# Patient Record
Sex: Male | Born: 1945 | Race: White | Hispanic: No | Smoking: Current every day smoker
Health system: Southern US, Community
[De-identification: ages and names within clinical notes are randomized; demographics above are authoritative.]

## PROBLEM LIST (undated history)

## (undated) DIAGNOSIS — D751 Secondary polycythemia: Principal | ICD-10-CM

## (undated) DIAGNOSIS — J449 Chronic obstructive pulmonary disease, unspecified: Secondary | ICD-10-CM

## (undated) DIAGNOSIS — I10 Essential (primary) hypertension: Secondary | ICD-10-CM

## (undated) DIAGNOSIS — J984 Other disorders of lung: Principal | ICD-10-CM

## (undated) DIAGNOSIS — I639 Cerebral infarction, unspecified: Secondary | ICD-10-CM

## (undated) DIAGNOSIS — A64 Unspecified sexually transmitted disease: Secondary | ICD-10-CM

## (undated) HISTORY — DX: Chronic obstructive pulmonary disease, unspecified: J44.9

## (undated) HISTORY — DX: Unspecified sexually transmitted disease: A64

## (undated) HISTORY — DX: Secondary polycythemia: D75.1

## (undated) HISTORY — PX: INCISE AND DRAIN ABCESS: PRO64

## (undated) HISTORY — PX: LYMPH NODE BIOPSY: SHX201

## (undated) HISTORY — DX: Other disorders of lung: J98.4

---

## 1998-03-12 ENCOUNTER — Encounter: Payer: Self-pay | Admitting: Neurosurgery

## 1998-03-13 ENCOUNTER — Observation Stay (HOSPITAL_COMMUNITY): Admission: RE | Admit: 1998-03-13 | Discharge: 1998-03-13 | Payer: Self-pay | Admitting: Neurosurgery

## 1998-03-13 ENCOUNTER — Encounter: Payer: Self-pay | Admitting: Neurosurgery

## 1998-05-07 ENCOUNTER — Ambulatory Visit (HOSPITAL_COMMUNITY): Admission: RE | Admit: 1998-05-07 | Discharge: 1998-05-07 | Payer: Self-pay | Admitting: Neurosurgery

## 1998-05-07 ENCOUNTER — Encounter: Payer: Self-pay | Admitting: Neurosurgery

## 1999-01-08 ENCOUNTER — Encounter: Payer: Self-pay | Admitting: Urology

## 1999-01-08 ENCOUNTER — Ambulatory Visit (HOSPITAL_COMMUNITY): Admission: RE | Admit: 1999-01-08 | Discharge: 1999-01-08 | Payer: Self-pay | Admitting: Urology

## 1999-02-12 ENCOUNTER — Encounter: Admission: RE | Admit: 1999-02-12 | Discharge: 1999-02-12 | Payer: Self-pay | Admitting: Urology

## 1999-02-12 ENCOUNTER — Encounter: Payer: Self-pay | Admitting: Urology

## 1999-04-01 ENCOUNTER — Encounter: Payer: Self-pay | Admitting: Urology

## 1999-04-01 ENCOUNTER — Encounter: Admission: RE | Admit: 1999-04-01 | Discharge: 1999-04-01 | Payer: Self-pay | Admitting: Urology

## 1999-04-09 ENCOUNTER — Ambulatory Visit (HOSPITAL_COMMUNITY): Admission: RE | Admit: 1999-04-09 | Discharge: 1999-04-09 | Payer: Self-pay | Admitting: Urology

## 1999-04-09 ENCOUNTER — Encounter: Payer: Self-pay | Admitting: Urology

## 1999-04-16 ENCOUNTER — Encounter: Admission: RE | Admit: 1999-04-16 | Discharge: 1999-04-16 | Payer: Self-pay | Admitting: Urology

## 1999-04-16 ENCOUNTER — Encounter: Payer: Self-pay | Admitting: Urology

## 1999-06-10 ENCOUNTER — Encounter: Payer: Self-pay | Admitting: Urology

## 1999-06-10 ENCOUNTER — Encounter: Admission: RE | Admit: 1999-06-10 | Discharge: 1999-06-10 | Payer: Self-pay | Admitting: Urology

## 2006-10-21 ENCOUNTER — Emergency Department (HOSPITAL_COMMUNITY): Admission: EM | Admit: 2006-10-21 | Discharge: 2006-10-21 | Payer: Self-pay | Admitting: Emergency Medicine

## 2009-03-17 ENCOUNTER — Encounter (HOSPITAL_COMMUNITY): Admission: RE | Admit: 2009-03-17 | Discharge: 2009-04-16 | Payer: Self-pay

## 2010-05-22 ENCOUNTER — Encounter (HOSPITAL_COMMUNITY): Payer: Non-veteran care | Attending: Oncology

## 2010-05-22 ENCOUNTER — Other Ambulatory Visit (HOSPITAL_COMMUNITY): Payer: Self-pay | Admitting: Oncology

## 2010-05-22 ENCOUNTER — Ambulatory Visit (HOSPITAL_COMMUNITY): Payer: Non-veteran care

## 2010-05-22 ENCOUNTER — Ambulatory Visit (HOSPITAL_COMMUNITY): Payer: Non-veteran care | Admitting: Oncology

## 2010-05-22 DIAGNOSIS — F17200 Nicotine dependence, unspecified, uncomplicated: Secondary | ICD-10-CM | POA: Insufficient documentation

## 2010-05-22 DIAGNOSIS — D751 Secondary polycythemia: Secondary | ICD-10-CM

## 2010-05-22 DIAGNOSIS — J449 Chronic obstructive pulmonary disease, unspecified: Secondary | ICD-10-CM | POA: Insufficient documentation

## 2010-05-22 DIAGNOSIS — J4489 Other specified chronic obstructive pulmonary disease: Secondary | ICD-10-CM | POA: Insufficient documentation

## 2010-05-22 LAB — URINE MICROSCOPIC-ADD ON

## 2010-05-27 ENCOUNTER — Encounter (HOSPITAL_COMMUNITY): Payer: Non-veteran care

## 2010-06-03 ENCOUNTER — Encounter (HOSPITAL_COMMUNITY): Payer: Non-veteran care

## 2010-06-03 DIAGNOSIS — D751 Secondary polycythemia: Secondary | ICD-10-CM

## 2010-06-10 ENCOUNTER — Encounter (HOSPITAL_COMMUNITY): Payer: Non-veteran care

## 2010-06-10 ENCOUNTER — Encounter (HOSPITAL_COMMUNITY): Payer: Non-veteran care | Attending: Oncology

## 2010-06-10 DIAGNOSIS — D751 Secondary polycythemia: Secondary | ICD-10-CM

## 2010-06-17 ENCOUNTER — Encounter (HOSPITAL_COMMUNITY): Payer: Non-veteran care | Attending: Oncology

## 2010-06-17 ENCOUNTER — Encounter (HOSPITAL_COMMUNITY): Payer: Non-veteran care

## 2010-06-17 DIAGNOSIS — J4489 Other specified chronic obstructive pulmonary disease: Secondary | ICD-10-CM | POA: Insufficient documentation

## 2010-06-17 DIAGNOSIS — J449 Chronic obstructive pulmonary disease, unspecified: Secondary | ICD-10-CM | POA: Insufficient documentation

## 2010-06-17 DIAGNOSIS — F17200 Nicotine dependence, unspecified, uncomplicated: Secondary | ICD-10-CM | POA: Insufficient documentation

## 2010-06-17 DIAGNOSIS — D751 Secondary polycythemia: Secondary | ICD-10-CM

## 2010-06-24 ENCOUNTER — Other Ambulatory Visit (HOSPITAL_COMMUNITY): Payer: Non-veteran care

## 2010-06-24 ENCOUNTER — Encounter (HOSPITAL_COMMUNITY): Payer: Non-veteran care | Attending: Oncology

## 2010-06-24 DIAGNOSIS — D751 Secondary polycythemia: Secondary | ICD-10-CM

## 2010-06-26 ENCOUNTER — Ambulatory Visit (HOSPITAL_COMMUNITY): Payer: Non-veteran care | Admitting: Oncology

## 2010-07-01 ENCOUNTER — Encounter (HOSPITAL_COMMUNITY): Payer: Non-veteran care

## 2010-07-01 DIAGNOSIS — D751 Secondary polycythemia: Secondary | ICD-10-CM

## 2010-07-03 ENCOUNTER — Encounter (HOSPITAL_COMMUNITY): Payer: Non-veteran care | Admitting: Oncology

## 2010-07-03 DIAGNOSIS — D751 Secondary polycythemia: Secondary | ICD-10-CM

## 2010-07-08 ENCOUNTER — Other Ambulatory Visit (HOSPITAL_COMMUNITY): Payer: Self-pay | Admitting: Oncology

## 2010-07-08 DIAGNOSIS — D751 Secondary polycythemia: Secondary | ICD-10-CM

## 2010-07-13 ENCOUNTER — Ambulatory Visit (HOSPITAL_COMMUNITY)
Admission: RE | Admit: 2010-07-13 | Discharge: 2010-07-13 | Disposition: A | Discharge status: Home / Self Care | Payer: Non-veteran care | Source: Ambulatory Visit | Attending: Oncology | Admitting: Oncology

## 2010-07-13 DIAGNOSIS — D751 Secondary polycythemia: Secondary | ICD-10-CM

## 2010-07-13 DIAGNOSIS — Q619 Cystic kidney disease, unspecified: Secondary | ICD-10-CM | POA: Insufficient documentation

## 2010-07-13 DIAGNOSIS — D45 Polycythemia vera: Secondary | ICD-10-CM | POA: Insufficient documentation

## 2010-07-13 DIAGNOSIS — Z87442 Personal history of urinary calculi: Secondary | ICD-10-CM | POA: Insufficient documentation

## 2010-08-05 ENCOUNTER — Other Ambulatory Visit (HOSPITAL_COMMUNITY): Payer: Self-pay | Admitting: Oncology

## 2010-08-05 ENCOUNTER — Encounter (HOSPITAL_COMMUNITY): Payer: Non-veteran care | Attending: Oncology

## 2010-08-05 DIAGNOSIS — D45 Polycythemia vera: Secondary | ICD-10-CM

## 2010-08-05 DIAGNOSIS — D751 Secondary polycythemia: Secondary | ICD-10-CM | POA: Insufficient documentation

## 2010-08-05 DIAGNOSIS — J4489 Other specified chronic obstructive pulmonary disease: Secondary | ICD-10-CM | POA: Insufficient documentation

## 2010-08-05 DIAGNOSIS — F17200 Nicotine dependence, unspecified, uncomplicated: Secondary | ICD-10-CM | POA: Insufficient documentation

## 2010-08-05 DIAGNOSIS — J449 Chronic obstructive pulmonary disease, unspecified: Secondary | ICD-10-CM | POA: Insufficient documentation

## 2010-08-05 LAB — DIFFERENTIAL
Basophils Relative: 2 % — ABNORMAL HIGH (ref 0–1)
Eosinophils Absolute: 0.2 10*3/uL (ref 0.0–0.7)
Eosinophils Absolute: 0.2 10*3/uL (ref 0.0–0.7)
Lymphocytes Relative: 30 % (ref 12–46)
Lymphs Abs: 1.6 10*3/uL (ref 0.7–4.0)
Neutro Abs: 3.3 10*3/uL (ref 1.7–7.7)
Neutrophils Relative %: 60 % (ref 43–77)
Neutrophils Relative %: 60 % (ref 43–77)

## 2010-08-05 LAB — CBC
Platelets: 152 10*3/uL (ref 150–400)
RBC: 5.07 MIL/uL (ref 4.22–5.81)
WBC: 5.5 10*3/uL (ref 4.0–10.5)

## 2010-08-07 ENCOUNTER — Encounter (HOSPITAL_COMMUNITY): Payer: Non-veteran care | Admitting: Oncology

## 2010-08-07 DIAGNOSIS — D45 Polycythemia vera: Secondary | ICD-10-CM

## 2010-08-12 ENCOUNTER — Encounter (HOSPITAL_COMMUNITY): Payer: Non-veteran care

## 2010-08-12 DIAGNOSIS — D45 Polycythemia vera: Secondary | ICD-10-CM

## 2010-08-19 ENCOUNTER — Encounter (HOSPITAL_COMMUNITY): Payer: Non-veteran care | Attending: Oncology

## 2010-08-19 DIAGNOSIS — F17200 Nicotine dependence, unspecified, uncomplicated: Secondary | ICD-10-CM | POA: Insufficient documentation

## 2010-08-19 DIAGNOSIS — D751 Secondary polycythemia: Secondary | ICD-10-CM

## 2010-08-19 DIAGNOSIS — J4489 Other specified chronic obstructive pulmonary disease: Secondary | ICD-10-CM | POA: Insufficient documentation

## 2010-08-19 DIAGNOSIS — J449 Chronic obstructive pulmonary disease, unspecified: Secondary | ICD-10-CM | POA: Insufficient documentation

## 2010-09-02 ENCOUNTER — Other Ambulatory Visit (HOSPITAL_COMMUNITY): Payer: Self-pay | Admitting: Oncology

## 2010-09-02 ENCOUNTER — Encounter (HOSPITAL_COMMUNITY): Payer: Non-veteran care

## 2010-09-02 DIAGNOSIS — D45 Polycythemia vera: Secondary | ICD-10-CM

## 2010-09-02 LAB — CBC
HCT: 48.7 % (ref 39.0–52.0)
MCV: 98.4 fL (ref 78.0–100.0)
Platelets: 155 10*3/uL (ref 150–400)
RBC: 4.95 MIL/uL (ref 4.22–5.81)
WBC: 7.3 10*3/uL (ref 4.0–10.5)

## 2010-09-04 ENCOUNTER — Encounter (HOSPITAL_COMMUNITY): Payer: Non-veteran care | Admitting: Oncology

## 2010-09-04 DIAGNOSIS — D45 Polycythemia vera: Secondary | ICD-10-CM

## 2010-09-09 ENCOUNTER — Encounter (HOSPITAL_COMMUNITY): Payer: Non-veteran care

## 2010-09-09 DIAGNOSIS — D45 Polycythemia vera: Secondary | ICD-10-CM

## 2010-09-22 ENCOUNTER — Encounter (HOSPITAL_COMMUNITY): Payer: Self-pay | Admitting: Oncology

## 2010-09-22 ENCOUNTER — Other Ambulatory Visit (HOSPITAL_COMMUNITY): Payer: Self-pay | Admitting: Oncology

## 2010-09-22 DIAGNOSIS — D751 Secondary polycythemia: Secondary | ICD-10-CM

## 2010-09-22 DIAGNOSIS — J449 Chronic obstructive pulmonary disease, unspecified: Secondary | ICD-10-CM

## 2010-09-22 DIAGNOSIS — J984 Other disorders of lung: Secondary | ICD-10-CM

## 2010-09-22 HISTORY — DX: Other disorders of lung: J98.4

## 2010-09-22 HISTORY — DX: Chronic obstructive pulmonary disease, unspecified: J44.9

## 2010-09-22 HISTORY — DX: Secondary polycythemia: D75.1

## 2010-09-23 ENCOUNTER — Encounter (HOSPITAL_COMMUNITY): Payer: Non-veteran care | Attending: Oncology | Admitting: Oncology

## 2010-09-23 VITALS — BP 135/84 | HR 83 | Temp 98.5°F

## 2010-09-23 DIAGNOSIS — J984 Other disorders of lung: Secondary | ICD-10-CM | POA: Insufficient documentation

## 2010-09-23 DIAGNOSIS — D751 Secondary polycythemia: Secondary | ICD-10-CM

## 2010-09-23 NOTE — Progress Notes (Signed)
Phlebotomy started at 0948 and ended at 0954. 500 cc removed. Patient tolerated well.

## 2010-10-07 ENCOUNTER — Encounter (HOSPITAL_BASED_OUTPATIENT_CLINIC_OR_DEPARTMENT_OTHER): Payer: Non-veteran care

## 2010-10-07 DIAGNOSIS — D751 Secondary polycythemia: Secondary | ICD-10-CM

## 2010-10-07 DIAGNOSIS — J984 Other disorders of lung: Secondary | ICD-10-CM

## 2010-10-07 LAB — CBC
HCT: 51.6 % (ref 39.0–52.0)
Hemoglobin: 18.1 g/dL — ABNORMAL HIGH (ref 13.0–17.0)
MCHC: 35.1 g/dL (ref 30.0–36.0)

## 2010-10-07 LAB — DIFFERENTIAL
Basophils Relative: 1 % (ref 0–1)
Monocytes Absolute: 0.5 10*3/uL (ref 0.1–1.0)
Monocytes Relative: 5 % (ref 3–12)
Neutro Abs: 6.3 10*3/uL (ref 1.7–7.7)

## 2010-10-07 LAB — IRON AND TIBC
Saturation Ratios: 15 % — ABNORMAL LOW (ref 20–55)
UIBC: 291 ug/dL

## 2010-10-07 LAB — FERRITIN: Ferritin: 23 ng/mL (ref 22–322)

## 2010-10-07 NOTE — Progress Notes (Signed)
Labs drawn today for cbc/diff,ferr, Iron/Ibc

## 2010-10-09 ENCOUNTER — Other Ambulatory Visit (HOSPITAL_COMMUNITY): Payer: Self-pay | Admitting: Oncology

## 2010-10-09 DIAGNOSIS — D751 Secondary polycythemia: Secondary | ICD-10-CM

## 2010-10-09 DIAGNOSIS — J984 Other disorders of lung: Secondary | ICD-10-CM

## 2010-10-12 ENCOUNTER — Encounter (HOSPITAL_COMMUNITY): Payer: Self-pay | Admitting: Oncology

## 2010-10-12 ENCOUNTER — Encounter (HOSPITAL_BASED_OUTPATIENT_CLINIC_OR_DEPARTMENT_OTHER): Payer: Non-veteran care | Admitting: Oncology

## 2010-10-12 DIAGNOSIS — J449 Chronic obstructive pulmonary disease, unspecified: Secondary | ICD-10-CM

## 2010-10-12 DIAGNOSIS — D751 Secondary polycythemia: Secondary | ICD-10-CM

## 2010-10-12 DIAGNOSIS — J984 Other disorders of lung: Secondary | ICD-10-CM

## 2010-10-12 DIAGNOSIS — M109 Gout, unspecified: Secondary | ICD-10-CM | POA: Insufficient documentation

## 2010-10-12 DIAGNOSIS — A64 Unspecified sexually transmitted disease: Secondary | ICD-10-CM

## 2010-10-12 HISTORY — DX: Unspecified sexually transmitted disease: A64

## 2010-10-12 NOTE — Progress Notes (Signed)
RIVIS,LILANA, MD No address on file  1. 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 COPD (chronic obstructive pulmonary disease)     CURRENT THERAPY: Intermittent Phlebotomies as needed per lab work.  INTERVAL HISTORY: Melvin Hart 65 y.o. 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  The patient denies any complaints today.  He does mention some "itching" of the external ear.  Went over patient education regarding this and it could be secondary to his high counts and histamine release.  Spent some time with the patient going over education regarding his blood counts.  The patient reports that he is trying to lose weight.  His goal is to lose weight and quit smoking.  I encouraged the patient regarding this plan of action and offered help with his smoking cessation program when he is able to cut his smoking habit to 1/4 of what it is today.  I explained smoking cessation may help with his erythrocytosis.  Past Medical History  Diagnosis Date  . 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     is allergic to keflex and codeine.  Mr. Meidinger does not currently have medications on file.  No past surgical history on file.  Denies any headaches, dizziness, double vision, fevers, chills, night sweats, nausea, vomiting, diarrhea, constipation, chest pain, heart palpitations, shortness of breath, blood in stool, black tarry stool, urinary pain, urinary burning, urinary frequency, hematuria.   PHYSICAL EXAMINATION  Filed Vitals:   10/12/10 1041  BP: 133/78  Pulse: 97  Temp: 99 F (37.2 C)    GENERAL:alert, healthy, no distress, well nourished, well developed, comfortable and cooperative SKIN: texture, turgor are normal, no rashes or significant  lesions. Skin color is erythematous in nature. HEAD: Normocephalic, No masses, lesions, tenderness or abnormalities EYES: normal EARS: External ears normal OROPHARYNX:mucous membranes are moist  NECK: supple, no adenopathy, no bruits, thyroid normal size, non-tender, without nodularity, no stridor, non-tender, trachea midline LYMPH:  no palpable lymphadenopathy BREAST:not examined LUNGS: inspiratory and expiratory wheezes auscultated B/L throughout. HEART: regular rate & rhythm, no murmurs, no gallops, S1 normal and S2 normal ABDOMEN:abdomen soft, non-tender, obese, normal bowel sounds and no masses or organomegaly BACK: Back symmetric, no curvature., No CVA tenderness EXTREMITIES:less then 2 second capillary refill, no joint deformities, effusion, or inflammation, no edema, no skin discoloration, no clubbing, no cyanosis  NEURO: alert & oriented x 3 with fluent speech, no focal motor/sensory deficits, gait normal    LABORATORY DATA: Lab Results  Component Value Date   WBC 9.0 10/07/2010   HGB 18.1* 10/07/2010   HCT 51.6 10/07/2010   MCV 96.8 10/07/2010   PLT 147* 10/07/2010      ASSESSMENT:  1. Erythrocytosis secondary to lung disease secondary to tobacco abuse 2. COPD, secondary to long standing smoking history 3. Tobacco abuse, still smoking 1 ppd.   PLAN:  1. Will schedule phlebotomies x 3 one week apart. 2. CBC one month following third phlebotomy. 3. Return following lab work for follow-up. 4. Encouraged the patient to quit smoking tobacco.  Smoking cessation education provided.   All questions were answered. The patient knows to call the clinic with any problems, questions or concerns. We can certainly see the patient much sooner if necessary.  The patient and plan discussed with Arlan Organ, MD  and he is in agreement with the aforementioned.  I spent 15 minutes counseling the patient face to face. The total time spent in the appointment was 30  minutes.  Melvin Hart

## 2010-10-12 NOTE — Patient Instructions (Signed)
[facility]  Discharge Instructions   SPECIAL INSTRUCTIONS/FOLLOW-UP: Lab work Needed on 12/02/2010 and Return to Clinic on August 1st for a phlebotomy.   I acknowledge that I have been informed and understand all the instructions given to me and received a copy. I do not have any more questions at this time, but understand that I may call the Specialty Clinic at [facility] at [phone] during business hours should I have any further questions or need assistance in obtaining follow-up care.    __________________________________________  _____________  __________ Signature of Patient or Authorized Representative            Date                   Time    __________________________________________ Nurse's Signature

## 2010-10-14 ENCOUNTER — Encounter (HOSPITAL_COMMUNITY): Payer: Non-veteran care | Attending: Oncology

## 2010-10-14 DIAGNOSIS — D751 Secondary polycythemia: Secondary | ICD-10-CM | POA: Insufficient documentation

## 2010-10-14 DIAGNOSIS — J984 Other disorders of lung: Secondary | ICD-10-CM

## 2010-10-14 NOTE — Progress Notes (Deleted)
[facility] NEW PATIENT EVALUATION    FAMILY HISTORY: family history is not on file.   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       CURRENT MEDICATIONS: Mr. Llerena does not currently have medications on file.   SOCIAL HISTORY:  does not have a smoking history on file. He does not have any smokeless tobacco history on file.     ALLERGIES: Keflex and Codeine   LABORATORY DATA:  No results found for this or any previous visit (from the past 48 hour(s)).     RADIOGRAPHY: @[id number] @      REVIEW OF SYSTEMS: {dnt review of systems (ROS):20055}   PHYSICAL EXAM:  blood pressure is 108/70 and his pulse is 98.  {physical exam:21449}     IMPRESSION: ***   PLAN: ***       VSS. 500 phlebotomy performed. Patient tolerated well.

## 2010-10-14 NOTE — Progress Notes (Signed)
VSS. 500 phlebotomy performed. Patient tolerated well

## 2010-10-28 ENCOUNTER — Encounter (HOSPITAL_COMMUNITY): Payer: Non-veteran care

## 2010-10-28 NOTE — Progress Notes (Signed)
Phlebotomy started at 0856 and ended at 0904.  500cc removed. Tolerated well. Needle removed intact.

## 2010-11-04 ENCOUNTER — Encounter (HOSPITAL_COMMUNITY): Payer: Non-veteran care

## 2010-11-04 DIAGNOSIS — D751 Secondary polycythemia: Secondary | ICD-10-CM

## 2010-11-04 NOTE — Progress Notes (Signed)
Phlebotomy started at 0857 and ended at 0907. 500cc removed. Pt tolerated well. Vitals stable.

## 2010-11-04 NOTE — Progress Notes (Signed)
Addended by: Oda Kilts on: 11/04/2010 10:13 AM   Modules accepted: Orders

## 2010-11-23 ENCOUNTER — Other Ambulatory Visit (HOSPITAL_COMMUNITY): Payer: Non-veteran care

## 2010-12-03 ENCOUNTER — Telehealth (HOSPITAL_COMMUNITY): Payer: Self-pay | Admitting: *Deleted

## 2010-12-03 ENCOUNTER — Other Ambulatory Visit (HOSPITAL_COMMUNITY): Payer: Self-pay | Admitting: Oncology

## 2010-12-03 ENCOUNTER — Encounter (HOSPITAL_COMMUNITY): Payer: Medicare Other | Attending: Oncology | Admitting: Oncology

## 2010-12-03 ENCOUNTER — Encounter (HOSPITAL_BASED_OUTPATIENT_CLINIC_OR_DEPARTMENT_OTHER): Payer: Medicare Other

## 2010-12-03 DIAGNOSIS — D751 Secondary polycythemia: Secondary | ICD-10-CM

## 2010-12-03 DIAGNOSIS — J4489 Other specified chronic obstructive pulmonary disease: Secondary | ICD-10-CM

## 2010-12-03 DIAGNOSIS — J984 Other disorders of lung: Secondary | ICD-10-CM

## 2010-12-03 DIAGNOSIS — J449 Chronic obstructive pulmonary disease, unspecified: Secondary | ICD-10-CM

## 2010-12-03 LAB — CBC
MCV: 92.9 fL (ref 78.0–100.0)
Platelets: 172 10*3/uL (ref 150–400)
RBC: 5.21 MIL/uL (ref 4.22–5.81)
WBC: 8.8 10*3/uL (ref 4.0–10.5)

## 2010-12-03 NOTE — Telephone Encounter (Signed)
Message copied by Dennie Maizes on Thu Dec 03, 2010  3:49 PM ------      Message from: Ellouise Newer III      Created: Thu Dec 03, 2010 12:35 PM       Lab work looks great.  Encourage the patient to donate blood when the opportunity arises.  Lets due lab work monthly for now and return for follow-up in 3 months.  Call him and let him know.  Thanks

## 2010-12-03 NOTE — Telephone Encounter (Signed)
Spoke with pt as below. Verbalizes understanding. 

## 2010-12-03 NOTE — Progress Notes (Signed)
Labs drawn today for cbc 

## 2010-12-03 NOTE — Patient Instructions (Signed)
[facility]  Discharge Instructions  RECOMMENDATIONS MADE BY THE CONSULTANT AND ANY TEST RESULTS WILL BE SENT TO YOUR REFERRING DOCTOR.   EXAM FINDINGS BY MD TODAY AND SIGNS AND SYMPTOMS TO REPORT TO CLINIC OR PRIMARY MD:      SPECIAL INSTRUCTIONS/FOLLOW-UP: Return to clinic in 3 months for lab work prior to MD appt.   I acknowledge that I have been informed and understand all the instructions given to me and received a copy. I do not have any more questions at this time, but understand that I may call the Specialty Clinic at [facility] at [phone] during business hours should I have any further questions or need assistance in obtaining follow-up care.    __________________________________________  _____________  __________ Signature of Patient or Authorized Representative            Date                   Time    __________________________________________ Nurse's Signature

## 2010-12-03 NOTE — Progress Notes (Signed)
Hart,LILANA, MD No address on file  1. COPD (chronic obstructive pulmonary disease)   2. 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  The patient of course is slightly upset that I was late to see him by 20 minutes.  His appointment time was at 9:30 am, he arrived at the clinic well before 9 am, and I entered the exam room after reviewing lab work and reviewing my previous note.  The patient's lab work was performed this morning, but have not yet been resulted.  As a result, we will go over the lab work via telephone.  The patient admits that he is "broke" financially.  He reports that he has been playing the lottery and "hoping for the best."    The patient admits to SOB when the weather is hot.  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  He has completed that and is now awaiting payment.  Past Medical History  Diagnosis Date  . 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 COPD (chronic obstructive pulmonary disease) 09/22/2010  . History of Venereal disease 10/12/2010  . 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     is allergic to keflex and codeine.  Melvin Hart does not currently have medications on file.  No past surgical history on file.  Denies any headaches, dizziness, double vision, fevers, chills, night sweats, nausea, vomiting, diarrhea, constipation, chest pain, heart palpitations, blood in stool, black tarry stool, urinary pain, urinary burning, urinary frequency, hematuria.   PHYSICAL EXAMINATION  ECOG PERFORMANCE STATUS: 0 - Asymptomatic  Filed Vitals:   12/03/10 0943  BP: 175/95  Pulse: 97  Temp: 98.6 F (37 C)    GENERAL:alert, no distress, well nourished, well developed, comfortable, cooperative and obese SKIN: skin color, texture, turgor are normal HEAD: Normocephalic EYES: normal EARS: External ears normal OROPHARYNX:mucous membranes are moist  NECK: trachea midline LYMPH:  not examined BREAST:not examined LUNGS: clear to auscultation , significantly decreased breath sounds throughout HEART: regular rate & rhythm, no murmurs, no gallops, S1 normal and S2 normal ABDOMEN:abdomen soft, non-tender, obese and normal bowel sounds BACK: Back symmetric, no curvature. EXTREMITIES:less then 2 second capillary refill, no joint deformities, effusion, or inflammation, no cyanosis  NEURO: alert & oriented x 3 with fluent speech, no focal motor/sensory deficits, gait normal    PENDING LABS: CBC   ASSESSMENT:  1. 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 COPD   PLAN:  1. Will call patient to go over lab work that have not yet been resulted this AM 2. Patient will return in 3 months for lab work: CBC 3. Patient will return for follow-up in 3 months 4. Smoking cessation information provided and encouraged 5. Encouraged the patient to donate blood   All questions were answered. The patient knows to call the clinic with any problems, questions or concerns. We can certainly see the patient much sooner if necessary.   [name]

## 2010-12-30 ENCOUNTER — Encounter (HOSPITAL_COMMUNITY): Payer: Non-veteran care | Attending: Oncology

## 2010-12-30 DIAGNOSIS — J984 Other disorders of lung: Secondary | ICD-10-CM | POA: Insufficient documentation

## 2010-12-30 DIAGNOSIS — D751 Secondary polycythemia: Secondary | ICD-10-CM | POA: Insufficient documentation

## 2010-12-30 LAB — CBC
HCT: 46.3 % (ref 39.0–52.0)
Hemoglobin: 16 g/dL (ref 13.0–17.0)
RBC: 5.03 MIL/uL (ref 4.22–5.81)
WBC: 9.2 10*3/uL (ref 4.0–10.5)

## 2010-12-30 NOTE — Progress Notes (Signed)
Labs drawn today for cbc 

## 2011-01-27 ENCOUNTER — Encounter (HOSPITAL_COMMUNITY): Payer: Non-veteran care | Attending: Oncology

## 2011-01-27 DIAGNOSIS — D751 Secondary polycythemia: Secondary | ICD-10-CM | POA: Insufficient documentation

## 2011-01-27 DIAGNOSIS — J984 Other disorders of lung: Secondary | ICD-10-CM

## 2011-01-27 LAB — CBC
HCT: 48.4 % (ref 39.0–52.0)
Hemoglobin: 16.5 g/dL (ref 13.0–17.0)
MCH: 31.3 pg (ref 26.0–34.0)
MCV: 91.7 fL (ref 78.0–100.0)
RBC: 5.28 MIL/uL (ref 4.22–5.81)

## 2011-01-27 NOTE — Progress Notes (Signed)
Labs drawn today for cbc 

## 2011-02-03 ENCOUNTER — Encounter (HOSPITAL_BASED_OUTPATIENT_CLINIC_OR_DEPARTMENT_OTHER): Payer: Non-veteran care

## 2011-02-03 DIAGNOSIS — D751 Secondary polycythemia: Secondary | ICD-10-CM

## 2011-02-03 NOTE — Progress Notes (Signed)
VSS. Phlebotomy 500cc performed. Patient tolerated well. IV site wnl.

## 2011-02-10 ENCOUNTER — Encounter (HOSPITAL_BASED_OUTPATIENT_CLINIC_OR_DEPARTMENT_OTHER): Payer: Non-veteran care

## 2011-02-10 DIAGNOSIS — D751 Secondary polycythemia: Secondary | ICD-10-CM

## 2011-02-10 NOTE — Progress Notes (Signed)
500 cc phlebotomy started at 0935 and ended at 0943. Patient tolerated well. VSS. IV site WNL.

## 2011-02-17 ENCOUNTER — Encounter (HOSPITAL_COMMUNITY): Payer: Non-veteran care | Attending: Oncology

## 2011-02-17 DIAGNOSIS — D751 Secondary polycythemia: Secondary | ICD-10-CM | POA: Insufficient documentation

## 2011-02-17 DIAGNOSIS — J984 Other disorders of lung: Secondary | ICD-10-CM | POA: Insufficient documentation

## 2011-02-17 NOTE — Progress Notes (Signed)
500cc Therapeutic phlebotomy started at 1015am, completed at 1023am. Pt tolerated well. No problems noted post phlebotomy.

## 2011-03-02 ENCOUNTER — Other Ambulatory Visit (HOSPITAL_COMMUNITY): Payer: Non-veteran care

## 2011-03-04 ENCOUNTER — Ambulatory Visit (HOSPITAL_COMMUNITY): Payer: Non-veteran care | Admitting: Oncology

## 2011-03-10 ENCOUNTER — Encounter (HOSPITAL_BASED_OUTPATIENT_CLINIC_OR_DEPARTMENT_OTHER): Payer: Non-veteran care

## 2011-03-10 DIAGNOSIS — D751 Secondary polycythemia: Secondary | ICD-10-CM

## 2011-03-10 DIAGNOSIS — J984 Other disorders of lung: Secondary | ICD-10-CM

## 2011-03-10 DIAGNOSIS — J449 Chronic obstructive pulmonary disease, unspecified: Secondary | ICD-10-CM

## 2011-03-10 LAB — CBC
HCT: 45.4 % (ref 39.0–52.0)
Hemoglobin: 15.1 g/dL (ref 13.0–17.0)
MCHC: 33.3 g/dL (ref 30.0–36.0)
MCV: 91.2 fL (ref 78.0–100.0)
RDW: 13.3 % (ref 11.5–15.5)

## 2011-03-10 NOTE — Progress Notes (Signed)
Kari Baars Malecha presented for [employer]. Labs per MD order drawn via Peripheral Line 24 gauge needle inserted in rt ac  Good blood return present. Procedure without incident.  Needle removed intact. Patient tolerated procedure well.

## 2011-03-12 ENCOUNTER — Encounter (HOSPITAL_BASED_OUTPATIENT_CLINIC_OR_DEPARTMENT_OTHER): Payer: Non-veteran care | Admitting: Oncology

## 2011-03-12 ENCOUNTER — Encounter (HOSPITAL_COMMUNITY): Payer: Self-pay | Admitting: Oncology

## 2011-03-12 VITALS — BP 150/82 | HR 75 | Temp 98.0°F | Wt 188.4 lb

## 2011-03-12 DIAGNOSIS — J449 Chronic obstructive pulmonary disease, unspecified: Secondary | ICD-10-CM

## 2011-03-12 DIAGNOSIS — D751 Secondary polycythemia: Secondary | ICD-10-CM

## 2011-03-12 DIAGNOSIS — J984 Other disorders of lung: Secondary | ICD-10-CM

## 2011-03-12 NOTE — Progress Notes (Signed)
RIVIS,LILANA, MD No address on file  1. 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  S/P 3 phlebotomies on 02/03/11, 02/10/11, and 02/17/11.  INTERVAL HISTORY: Melvin Hart 65 y.o. male returns for  regular  visit for followup of  erythrocytosis secondary to pulmonary disease.   I personally reviewed and went over laboratory results with the patient.  We have decreased his Hgb from 16.5 to 15.1 with three phlebotomies.   I explained that our goal is to get his Hgb below 15 g/dL.  He has requested to donate blood at the [facility].  I encouraged the patient to do this.  We have encouraged him to donate his blood in the past, but the patient has a difficult time "waiting" and his last experience at the [facility] made him walk out of the clinic due to the waiting time.  The patient will donate his blood on 03/16/10- 03/19/10.  We will then check lab work 2 weeks following.  This will help with his medical bills.   The patient continues to abuse tobacco.  He has not decreased his tobacco use.   He denies any other complaints.    Past Medical History  Diagnosis Date  . 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 COPD (chronic obstructive pulmonary disease) 09/22/2010  . History of Venereal disease 10/12/2010  . 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     is allergic to keflex and codeine.  Mr. Cambre does not currently have medications on file.  Past Surgical History  Procedure Date  . Lymph node biopsy   . Incise and drain abcess     Denies any headaches, dizziness, double vision, fevers, chills, night sweats, nausea, vomiting, diarrhea, constipation, chest pain, heart palpitations, shortness of breath, blood in stool, black tarry stool, urinary pain, urinary  burning, urinary frequency, hematuria.   PHYSICAL EXAMINATION  ECOG PERFORMANCE STATUS: 1 - Symptomatic but completely ambulatory  Filed Vitals:   03/12/11 0959  BP: 150/82  Pulse: 75  Temp: 98 F (36.7 C)    GENERAL:alert, no distress, well nourished, well developed, comfortable, cooperative and smiling SKIN: skin color, texture, turgor are normal, no rashes or significant lesions HEAD: Normocephalic, No masses, lesions, tenderness or abnormalities EYES: normal EARS: External ears normal OROPHARYNX:mucous membranes are moist  NECK: supple, no adenopathy, no bruits, thyroid normal size, non-tender, without nodularity, no stridor, non-tender, trachea midline LYMPH:  no palpable lymphadenopathy, no hepatosplenomegaly BREAST:not examined LUNGS: clear to auscultation and percussion, decreased breath sounds HEART: regular rate & rhythm, no murmurs, no gallops, S1 normal and S2 normal ABDOMEN:abdomen soft, non-tender, normal bowel sounds and no hepatosplenomegaly BACK: Back symmetric, no curvature., No CVA tenderness EXTREMITIES:less then 2 second capillary refill, no joint deformities, effusion, or inflammation, no edema, no skin discoloration, no clubbing, no cyanosis  NEURO: alert & oriented x 3 with fluent speech, no focal motor/sensory deficits, gait normal   LABORATORY DATA: CBC    Component Value Date/Time   WBC 6.1 03/10/2011 0849   RBC 4.98 03/10/2011 0849   HGB 15.1 03/10/2011 0849   HCT 45.4 03/10/2011 0849   PLT 186 03/10/2011 0849   MCV 91.2 03/10/2011 0849   MCH 30.3 03/10/2011 0849   MCHC 33.3 03/10/2011 0849   RDW 13.3 03/10/2011 0849   LYMPHSABS  1.9 10/07/2010 0911   MONOABS 0.5 10/07/2010 0911   EOSABS 0.3 10/07/2010 0911   BASOSABS 0.1 10/07/2010 0911      ASSESSMENT:  1. 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  2. COPD   PLAN:  1. Patient will donate blood at the [facility] after Mar 16, 2011.  He will donate every 8 weeks as permitted by the  [facility].  2. Lab work in 3 weeks and 6 weeks: CBC 3. I personally reviewed and went over laboratory results with the patient. 4. Our target goal is a Hgb below 15 g/dL 5. Patient education regarding our target. 6. Smoking Cessation discussion.  Patient not interested at this time.  7. Return in 6 weeks for follow-up.  We may need to phlebotomize the patient between blood donations as needed.   We will monitor blood counts with regular lab work.   All questions were answered. The patient knows to call the clinic with any problems, questions or concerns. We can certainly see the patient much sooner if necessary.  The patient and plan discussed with Glenford Peers, MD and he is in agreement with the aforementioned.   Kare Dado

## 2011-03-12 NOTE — Patient Instructions (Signed)
[facility]  Discharge Instructions KEYONTE COOKSTON  [id number] 04/30/1945  RECOMMENDATIONS MADE BY THE CONSULTANT AND ANY TEST RESULTS WILL BE SENT TO YOUR REFERRING DOCTOR.   EXAM FINDINGS BY MD TODAY AND SIGNS AND SYMPTOMS TO REPORT TO CLINIC OR PRIMARY MD: Exam findings as discussed per T. Kefalas, PA-C.    INSTRUCTIONS GIVEN AND DISCUSSED:  Plan on donating blood to [facility] as discussed.  Return to [facility] for labs as discussed.  I acknowledge that I have been informed and understand all the instructions given to me and received a copy. I do not have any more questions at this time, but understand that I may call the Specialty Clinic at [facility] at [phone] during business hours should I have any further questions or need assistance in obtaining follow-up care.    __________________________________________  _____________  __________ Signature of Patient or Authorized Representative            Date                   Time    __________________________________________ Nurse's Signature

## 2011-03-22 ENCOUNTER — Encounter (HOSPITAL_COMMUNITY): Payer: Non-veteran care | Attending: Oncology

## 2011-03-22 DIAGNOSIS — D751 Secondary polycythemia: Secondary | ICD-10-CM | POA: Insufficient documentation

## 2011-03-22 DIAGNOSIS — J984 Other disorders of lung: Secondary | ICD-10-CM | POA: Insufficient documentation

## 2011-03-22 LAB — CBC
MCH: 29.5 pg (ref 26.0–34.0)
MCHC: 33 g/dL (ref 30.0–36.0)
MCV: 89.5 fL (ref 78.0–100.0)
Platelets: 187 10*3/uL (ref 150–400)

## 2011-03-22 NOTE — Progress Notes (Signed)
Labs drawn today for cbc 

## 2011-04-22 ENCOUNTER — Other Ambulatory Visit (HOSPITAL_COMMUNITY): Payer: Non-veteran care

## 2011-04-28 ENCOUNTER — Other Ambulatory Visit (HOSPITAL_COMMUNITY): Payer: Self-pay | Admitting: Oncology

## 2011-04-28 ENCOUNTER — Telehealth (HOSPITAL_COMMUNITY): Payer: Self-pay | Admitting: *Deleted

## 2011-04-28 DIAGNOSIS — D751 Secondary polycythemia: Secondary | ICD-10-CM

## 2011-04-28 DIAGNOSIS — J984 Other disorders of lung: Secondary | ICD-10-CM

## 2011-04-28 NOTE — Telephone Encounter (Signed)
Message copied by Dennie Maizes on Wed Apr 28, 2011  2:47 PM ------      Message from: Ellouise Newer III      Created: Wed Apr 28, 2011  1:05 PM       Has he donated yet?  If so, when?  We need to set him up for lab work sometime before I see him in March

## 2011-04-28 NOTE — Telephone Encounter (Signed)
Pt said he has not donated blood yet because every time he goes to the [facility] they aren't taking blood at that site or he is unable to go to other sites. Encouraged pt to get a list of donation sites from the [facility] and they can help him set up an appointment. Pt said he will try to do but doesn't know when. i informed him that we would call back with further instructions after relaying this information to the doctor.

## 2011-04-28 NOTE — Telephone Encounter (Signed)
Let's bring him in for a CBC next week and schedule a phlebotomy if needed per lab results

## 2011-05-04 ENCOUNTER — Other Ambulatory Visit (HOSPITAL_COMMUNITY): Payer: Self-pay | Admitting: Oncology

## 2011-05-04 ENCOUNTER — Encounter (HOSPITAL_COMMUNITY): Payer: Medicare Other | Attending: Oncology

## 2011-05-04 DIAGNOSIS — J984 Other disorders of lung: Secondary | ICD-10-CM

## 2011-05-04 DIAGNOSIS — D751 Secondary polycythemia: Secondary | ICD-10-CM

## 2011-05-04 DIAGNOSIS — J449 Chronic obstructive pulmonary disease, unspecified: Secondary | ICD-10-CM

## 2011-05-04 LAB — CBC
MCHC: 34.8 g/dL (ref 30.0–36.0)
MCV: 87.1 fL (ref 78.0–100.0)
Platelets: 191 10*3/uL (ref 150–400)
RDW: 14.2 % (ref 11.5–15.5)
WBC: 6.3 10*3/uL (ref 4.0–10.5)

## 2011-05-04 NOTE — Progress Notes (Signed)
Labs drawn today for cbc 

## 2011-05-05 ENCOUNTER — Encounter (HOSPITAL_BASED_OUTPATIENT_CLINIC_OR_DEPARTMENT_OTHER): Payer: Medicare Other

## 2011-05-05 VITALS — BP 123/86 | HR 103

## 2011-05-05 DIAGNOSIS — D751 Secondary polycythemia: Secondary | ICD-10-CM

## 2011-05-05 NOTE — Progress Notes (Signed)
TPR 500cc removed. Patient tolerated well. No problems. IV site wnl.

## 2011-05-12 ENCOUNTER — Encounter (HOSPITAL_BASED_OUTPATIENT_CLINIC_OR_DEPARTMENT_OTHER): Payer: Medicare Other

## 2011-05-12 DIAGNOSIS — D751 Secondary polycythemia: Secondary | ICD-10-CM

## 2011-05-12 NOTE — Progress Notes (Signed)
Melvin Hart presents today for phlebotomy per MD orders. Phlebotomy procedure started at 1055 am and ended at 1104 am. 500 cc removed. Patient observed for 30 minutes after procedure without any incident. Patient tolerated procedure well. IV needle removed intact.

## 2011-05-19 ENCOUNTER — Encounter (HOSPITAL_COMMUNITY): Payer: Non-veteran care

## 2011-06-03 ENCOUNTER — Ambulatory Visit (HOSPITAL_COMMUNITY): Payer: Non-veteran care | Admitting: Oncology

## 2011-06-17 ENCOUNTER — Other Ambulatory Visit (HOSPITAL_COMMUNITY): Payer: Non-veteran care

## 2011-06-17 ENCOUNTER — Ambulatory Visit (HOSPITAL_COMMUNITY): Payer: Non-veteran care | Admitting: Oncology

## 2011-07-20 ENCOUNTER — Encounter (HOSPITAL_COMMUNITY): Payer: Self-pay

## 2011-07-20 ENCOUNTER — Encounter (HOSPITAL_COMMUNITY)
Admission: RE | Admit: 2011-07-20 | Discharge: 2011-07-20 | Disposition: A | Discharge status: Home / Self Care | Payer: Medicare Other | Source: Ambulatory Visit | Attending: Oncology | Admitting: Oncology

## 2011-07-20 VITALS — BP 110/60 | HR 81 | Ht 68.0 in | Wt 184.7 lb

## 2011-07-20 DIAGNOSIS — J449 Chronic obstructive pulmonary disease, unspecified: Secondary | ICD-10-CM

## 2011-07-20 DIAGNOSIS — J984 Other disorders of lung: Secondary | ICD-10-CM | POA: Insufficient documentation

## 2011-07-20 DIAGNOSIS — D751 Secondary polycythemia: Secondary | ICD-10-CM | POA: Insufficient documentation

## 2011-07-20 DIAGNOSIS — J4489 Other specified chronic obstructive pulmonary disease: Secondary | ICD-10-CM

## 2011-07-20 NOTE — Progress Notes (Addendum)
Patient was referred to Pulmonary Rehab by Dr. Juleen Starr, MD from the [facility]. Pt has a diagnosis of COPD with chronic bronchitis as his qualifying diagnosis.  During orientation advised patient on arrival and appointment times what to wear, what to do before, during and after exercise. Reviewed attendance and class policy. Talked about inclement weather and class consultation policy. Pt is scheduled to start Pulmonary Rehab on 08/11/11 at 1pm. Pt was advised to come to class 5 minutes before class starts. He was also given instructions on meeting with the dietician and attending the Family Structure classes. Pt is eager to get started.   Patient was given an education of the proper use of pursed lip breathing techniques. He was shown how to do the breathing and given handouts. He demonstrated an understanding of the education. He was also instructed to practice the technique during the six minute walk test.

## 2011-07-20 NOTE — Patient Instructions (Signed)
Pt has finished orientation and is scheduled to start [facility] Pulmonary class on 08/11/11 at 1 pm. Pt has been instructed to arrive to class 15 minutes early for scheduled class. Pt has been instructed to wear comfortable clothing and shoes with rubber soles. Pt has been told to take their medications 1 hour prior to coming to class.  If the patient is not going to attend class, he/she has been instructed to call.

## 2011-08-11 ENCOUNTER — Encounter (HOSPITAL_COMMUNITY)
Admission: RE | Admit: 2011-08-11 | Discharge: 2011-08-11 | Disposition: A | Discharge status: Home / Self Care | Payer: Medicare Other | Source: Ambulatory Visit | Attending: Family Medicine | Admitting: Family Medicine

## 2011-08-15 ENCOUNTER — Emergency Department (HOSPITAL_COMMUNITY)
Admission: EM | Admit: 2011-08-15 | Discharge: 2011-08-15 | Disposition: A | Discharge status: Home / Self Care | Payer: Medicare Other | Attending: Emergency Medicine | Admitting: Emergency Medicine

## 2011-08-15 ENCOUNTER — Encounter (HOSPITAL_COMMUNITY): Payer: Self-pay | Admitting: *Deleted

## 2011-08-15 DIAGNOSIS — I208 Other forms of angina pectoris: Secondary | ICD-10-CM

## 2011-08-15 DIAGNOSIS — M25519 Pain in unspecified shoulder: Secondary | ICD-10-CM | POA: Insufficient documentation

## 2011-08-15 DIAGNOSIS — M542 Cervicalgia: Secondary | ICD-10-CM | POA: Insufficient documentation

## 2011-08-15 DIAGNOSIS — J4489 Other specified chronic obstructive pulmonary disease: Secondary | ICD-10-CM | POA: Insufficient documentation

## 2011-08-15 DIAGNOSIS — R079 Chest pain, unspecified: Secondary | ICD-10-CM | POA: Insufficient documentation

## 2011-08-15 DIAGNOSIS — I10 Essential (primary) hypertension: Secondary | ICD-10-CM | POA: Insufficient documentation

## 2011-08-15 DIAGNOSIS — J449 Chronic obstructive pulmonary disease, unspecified: Secondary | ICD-10-CM | POA: Insufficient documentation

## 2011-08-15 DIAGNOSIS — M5412 Radiculopathy, cervical region: Secondary | ICD-10-CM | POA: Insufficient documentation

## 2011-08-15 DIAGNOSIS — I209 Angina pectoris, unspecified: Secondary | ICD-10-CM | POA: Insufficient documentation

## 2011-08-15 DIAGNOSIS — R209 Unspecified disturbances of skin sensation: Secondary | ICD-10-CM | POA: Insufficient documentation

## 2011-08-15 HISTORY — DX: Essential (primary) hypertension: I10

## 2011-08-15 MED ORDER — PREDNISONE 20 MG PO TABS
60.0000 mg | ORAL_TABLET | Freq: Once | ORAL | Status: AC
  Administered 2011-08-15: 60 mg via ORAL
  Filled 2011-08-15: qty 3

## 2011-08-15 MED ORDER — PREDNISONE 10 MG PO TABS: ORAL_TABLET | ORAL | Status: DC

## 2011-08-15 MED ORDER — OXYCODONE-ACETAMINOPHEN 5-325 MG PO TABS: 1.0000 | ORAL_TABLET | ORAL | Status: AC | PRN

## 2011-08-15 NOTE — ED Notes (Signed)
Instructions and prescriptions reviewed; f/u information provided; alert, in no distress.  Pt to return here for MRI scheduled Tuesday, June 4th at 0800. Verbalizes understanding.

## 2011-08-15 NOTE — ED Notes (Signed)
Pt presents with neck pain and numbness x 2 weeks with intermittent sharp chest pain worsening in past 2 weeks. Pt has home pulse ox, reading this AM was 89. Hx of COPD. CAD

## 2011-08-15 NOTE — ED Provider Notes (Signed)
Medical screening examination/treatment/procedure(s) were conducted as a shared visit with non-physician practitioner(s) and myself.  I personally evaluated the patient during the encounter  Pt with history of cervical fusion reports several weeks of paresthesia in his L arm, no numbness or weakness. Exam is normal. He also has known stable angina, reports intermittent chest pains, Denies change in pattern or severity to me, despite nursing note.Marland Kitchen No chest pain in the ED. EKG unchanged from previous. No need for further ED eval of chest pain, but advised to return for any worsening or persistent pain or for any other concerns. He is concerned primarily about the LUE tingling. Reassured it was unlikely to be a stroke and most likely a cervical radiculopathy. Scheduled for outpatient MRI and advised [facility] follow[facility].   Vestal Markin B. Bernette Mayers, MD 08/15/11 1308

## 2011-08-15 NOTE — ED Provider Notes (Signed)
History     CSN: [id number]  Arrival date & time 08/15/11  1059   First MD Initiated Contact with Patient 08/15/11 1139      Chief Complaint  Patient presents with  . Chest Pain    intermittent x 2 weeks  . Neck Pain    with numbness    (Consider location/radiation/quality/duration/timing/severity/associated sxs/prior treatment) HPI Comments: Melvin Hart presents with a two-week history of neck pain and intermittent numbness which radiates down to his left shoulder to his wrist which is been present for the past 2 weeks.  He denies any recent injury to his neck but does have a history of degenerative disc disease with a cervical fusion surgery in 1999.  He denies weakness in his upper extremities, but has intermittent sensation of tingling and numbness which radiates to his left, this symptom is worsened with certain positions, particularly turning his head towards his right shoulder, and is oftentimes worse when he wakes up from sleep.  He denies any fevers or chills, no increased shortness of breath although he does have chronic bronchitis for which she uses albuterol.  He also describes left chest pressure which is fleeting, intermittent and has been present for years and has not changed either in severity or chronicity.  He does have a history of CAD and has been told that this is angina by his primary provider at the [place] in [place].  This symptom is not associated with or occurs with the neck pain and radiculopathy symptoms.  The history is provided by the patient.    Past Medical History  Diagnosis Date  . 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 COPD (chronic obstructive pulmonary disease) 09/22/2010  . History of Venereal disease 10/12/2010  . History of Gout 10/12/2010  . Hypertension     Past Surgical History  Procedure Date  . Lymph node biopsy   . Incise and drain abcess     No family history on file.  History  Substance Use Topics  . Smoking  status: Current Everyday Smoker -- 1.0 packs/day    Types: Cigarettes  . Smokeless tobacco: Not on file  . Alcohol Use: Yes     daily      Review of Systems  Constitutional: Negative for fever.  HENT: Positive for neck pain.   Respiratory: Positive for shortness of breath and wheezing. Negative for cough.   Cardiovascular: Positive for chest pain. Negative for palpitations and leg swelling.  Gastrointestinal: Negative for abdominal pain, constipation and abdominal distention.  Genitourinary: Negative for dysuria, urgency, frequency, flank pain and difficulty urinating.  Musculoskeletal: Negative for joint swelling and gait problem.  Skin: Negative for rash.  Neurological: Negative for weakness and numbness.    Allergies  Cephalexin and Codeine  Home Medications   Current Outpatient Rx  Name Route Sig Dispense Refill  . ALBUTEROL SULFATE HFA 108 (90 BASE) MCG/ACT IN AERS Inhalation Inhale 2 puffs into the lungs every 6 (six) hours as needed. Shortness of breath    . ASPIRIN EC 81 MG PO TBEC Oral Take 81 mg by mouth daily.    Marland Kitchen DIPHENHYDRAMINE HCL 25 MG PO TABS Oral Take 25 mg by mouth at bedtime as needed. Sleep    . LISINOPRIL 10 MG PO TABS Oral Take 10 mg by mouth daily.      BP 125/68  Pulse 87  Temp(Src) 98.6 F (37 C) (Oral)  Resp 22  Ht 5\' 8"  (1.727 m)  Wt 178 lb (80.74  kg)  BMI 27.06 kg/m2  SpO2 94%  Physical Exam  Nursing note and vitals reviewed. Constitutional: He appears well-developed and well-nourished.  HENT:  Head: Normocephalic.  Eyes: Conjunctivae are normal.  Neck: Normal range of motion. Neck supple. Muscular tenderness present. No rigidity. No edema and no erythema present.         The patient has tenderness to palpation along left trapezius muscle around the C6-7 level.  No muscle spasm appreciated.  He has no midline cervical tenderness or swelling.  He can elicit radiation of pain down his left arm with right head rotation.  Cardiovascular:  Normal rate and intact distal pulses.        Pedal pulses normal.  Pulmonary/Chest: Effort normal. No respiratory distress. He has no wheezes.  Abdominal: Soft. Bowel sounds are normal. He exhibits no distension and no mass.  Musculoskeletal: Normal range of motion. He exhibits no edema and no tenderness.       Lumbar back: He exhibits tenderness. He exhibits no swelling, no edema and no spasm.  Lymphadenopathy:    He has no cervical adenopathy.  Neurological: He is alert. He has normal strength. He displays no atrophy and no tremor. No sensory deficit. Gait normal.  Reflex Scores:      Bicep reflexes are 2+ on the right side and 2+ on the left side.      No strength deficit noted in upper extremities including forearm flexion extension.  He has equal grip strength bilaterally.  Skin: Skin is warm and dry.  Psychiatric: He has a normal mood and affect.    ED Course  Procedures (including critical care time)  Labs Reviewed - No data to display No results found.   1. Cervical radiculopathy       MDM  Patient has been scheduled for an outpatient MRI of his C-spine on 08/17/11 at 8 AM.  He was also given a six-day 60 mg to 10 mg prednisone taper and Percocet.  Further management pending results of MRI.  He was also advised to contact his physician at the [facility] in [place] for further management of this condition.  Also encouraged patient to have a merchant rechecked if he develops weakness or loss of movement in his upper extremity which can be signs but worsening emergent condition.    Date: 08/15/2011  Rate: 86  Rhythm: normal sinus rhythm  QRS Axis: normal  Intervals: normal  ST/T Wave abnormalities: normal  Conduction Disutrbances:none  Narrative Interpretation:   Old EKG Reviewed: none available          Burgess Amor, PA 08/15/11 1407

## 2011-08-15 NOTE — ED Notes (Signed)
C/o left sided neck pain x 2 weeks; reports chest pain off and on x 2 weeks; denies pain presently.  Alert, in no distress. EKG completed.

## 2011-08-15 NOTE — ED Notes (Signed)
Placed on continuous pulse ox-SpO2 94% RA

## 2011-08-15 NOTE — Discharge Instructions (Signed)
Cervical Radiculopathy Cervical radiculopathy happens when a nerve in the neck is pinched or bruised by a slipped (herniated) disk or by arthritic changes in the bones of the cervical spine. This can occur due to an injury or as part of the normal aging process. Pressure on the cervical nerves can cause pain or numbness that runs from your neck all the way down into your arm and fingers. CAUSES  There are many possible causes, including:  Injury.   Muscle tightness in the neck from overuse.   Swollen, painful joints (arthritis).   Breakdown or degeneration in the bones and joints of the spine (spondylosis) due to aging.   Bone spurs that may develop near the cervical nerves.  SYMPTOMS  Symptoms include pain, weakness, or numbness in the affected arm and hand. Pain can be severe or irritating. Symptoms may be worse when extending or turning the neck. DIAGNOSIS  Your caregiver will ask about your symptoms and do a physical exam. He or she may test your strength and reflexes. X-rays, CT scans, and MRI scans may be needed in cases of injury or if the symptoms do not go away after a period of time. Electromyography (EMG) or nerve conduction testing may be done to study how your nerves and muscles are working. TREATMENT  Your caregiver may recommend certain exercises to help relieve your symptoms. Cervical radiculopathy can, and often does, get better with time and treatment. If your problems continue, treatment options may include:  Wearing a soft collar for short periods of time.   Physical therapy to strengthen the neck muscles.   Medicines, such as nonsteroidal anti-inflammatory drugs (NSAIDs), oral corticosteroids, or spinal injections.   Surgery. Different types of surgery may be done depending on the cause of your problems.  HOME CARE INSTRUCTIONS   Put ice on the affected area.   Put ice in a plastic bag.   Place a towel between your skin and the bag.   Leave the ice on for 15  to 20 minutes, 3 to 4 times a day or as directed by your caregiver.   Use a flat pillow when you sleep.   Only take over-the-counter or prescription medicines for pain, discomfort, or fever as directed by your caregiver.   If physical therapy was prescribed, follow your caregiver's directions.   If a soft collar was prescribed, use it as directed.  SEEK IMMEDIATE MEDICAL CARE IF:   Your pain gets much worse and cannot be controlled with medicines.   You have weakness or numbness in your hand, arm, face, or leg.   You have a high fever or a stiff, rigid neck.   You lose bowel or bladder control (incontinence).   You have trouble with walking, balance, or speaking.  MAKE SURE YOU:   Understand these instructions.   Will watch your condition.   Will get help right away if you are not doing well or get worse.  Document Released: 11/24/2000 Document Revised: 02/18/2011 Document Reviewed: 10/13/2010 [facility] Patient Information 2012 [place], [place].   You may take the oxycodone prescribed for pain relief.  This will make you drowsy - do not drive within 4 hours of taking this medication. Take your next dose of prednisone tomorrow morning.  Plan to be here Tuesday morning for your MRI and for further instructions after your test.

## 2011-08-16 ENCOUNTER — Encounter (HOSPITAL_COMMUNITY)
Admission: RE | Admit: 2011-08-16 | Discharge: 2011-08-16 | Disposition: A | Discharge status: Home / Self Care | Payer: Non-veteran care | Source: Ambulatory Visit | Attending: Oncology | Admitting: Oncology

## 2011-08-16 DIAGNOSIS — D751 Secondary polycythemia: Secondary | ICD-10-CM | POA: Insufficient documentation

## 2011-08-16 DIAGNOSIS — J984 Other disorders of lung: Secondary | ICD-10-CM | POA: Insufficient documentation

## 2011-08-17 ENCOUNTER — Ambulatory Visit (HOSPITAL_COMMUNITY)
Admit: 2011-08-17 | Discharge: 2011-08-17 | Disposition: A | Discharge status: Home / Self Care | Payer: Medicare Other | Source: Ambulatory Visit | Attending: Emergency Medicine | Admitting: Emergency Medicine

## 2011-08-17 DIAGNOSIS — M542 Cervicalgia: Secondary | ICD-10-CM | POA: Insufficient documentation

## 2011-08-17 DIAGNOSIS — M4802 Spinal stenosis, cervical region: Secondary | ICD-10-CM | POA: Insufficient documentation

## 2011-08-17 DIAGNOSIS — M25519 Pain in unspecified shoulder: Secondary | ICD-10-CM | POA: Insufficient documentation

## 2011-08-17 DIAGNOSIS — R209 Unspecified disturbances of skin sensation: Secondary | ICD-10-CM | POA: Insufficient documentation

## 2011-08-17 DIAGNOSIS — M5030 Other cervical disc degeneration, unspecified cervical region: Secondary | ICD-10-CM | POA: Insufficient documentation

## 2011-08-18 ENCOUNTER — Encounter (HOSPITAL_COMMUNITY)
Admission: RE | Admit: 2011-08-18 | Discharge: 2011-08-18 | Disposition: A | Discharge status: Home / Self Care | Payer: Non-veteran care | Source: Ambulatory Visit | Attending: *Deleted | Admitting: *Deleted

## 2011-08-23 ENCOUNTER — Encounter (HOSPITAL_COMMUNITY)
Admission: RE | Admit: 2011-08-23 | Discharge: 2011-08-23 | Disposition: A | Discharge status: Home / Self Care | Payer: Non-veteran care | Source: Ambulatory Visit | Attending: *Deleted | Admitting: *Deleted

## 2011-08-25 ENCOUNTER — Encounter (HOSPITAL_COMMUNITY)
Admission: RE | Admit: 2011-08-25 | Discharge: 2011-08-25 | Disposition: A | Discharge status: Home / Self Care | Payer: Non-veteran care | Source: Ambulatory Visit | Attending: *Deleted | Admitting: *Deleted

## 2011-08-30 ENCOUNTER — Encounter (HOSPITAL_COMMUNITY)
Admission: RE | Admit: 2011-08-30 | Discharge: 2011-08-30 | Disposition: A | Discharge status: Home / Self Care | Payer: Non-veteran care | Source: Ambulatory Visit | Attending: *Deleted | Admitting: *Deleted

## 2011-09-01 ENCOUNTER — Encounter (HOSPITAL_COMMUNITY)
Admission: RE | Admit: 2011-09-01 | Discharge: 2011-09-01 | Disposition: A | Discharge status: Home / Self Care | Payer: Non-veteran care | Source: Ambulatory Visit | Attending: Family Medicine | Admitting: Family Medicine

## 2011-09-03 ENCOUNTER — Encounter (HOSPITAL_COMMUNITY): Payer: Self-pay | Admitting: *Deleted

## 2011-09-03 ENCOUNTER — Emergency Department (HOSPITAL_COMMUNITY)
Admission: EM | Admit: 2011-09-03 | Discharge: 2011-09-03 | Disposition: A | Discharge status: Home / Self Care | Payer: Medicare Other | Attending: Emergency Medicine | Admitting: Emergency Medicine

## 2011-09-03 ENCOUNTER — Emergency Department (HOSPITAL_COMMUNITY): Payer: Medicare Other

## 2011-09-03 ENCOUNTER — Other Ambulatory Visit: Payer: Self-pay

## 2011-09-03 DIAGNOSIS — R0789 Other chest pain: Secondary | ICD-10-CM

## 2011-09-03 DIAGNOSIS — I10 Essential (primary) hypertension: Secondary | ICD-10-CM | POA: Insufficient documentation

## 2011-09-03 DIAGNOSIS — Z79899 Other long term (current) drug therapy: Secondary | ICD-10-CM | POA: Insufficient documentation

## 2011-09-03 DIAGNOSIS — Z7982 Long term (current) use of aspirin: Secondary | ICD-10-CM | POA: Insufficient documentation

## 2011-09-03 DIAGNOSIS — F17200 Nicotine dependence, unspecified, uncomplicated: Secondary | ICD-10-CM | POA: Insufficient documentation

## 2011-09-03 DIAGNOSIS — R071 Chest pain on breathing: Secondary | ICD-10-CM | POA: Insufficient documentation

## 2011-09-03 DIAGNOSIS — J438 Other emphysema: Secondary | ICD-10-CM | POA: Insufficient documentation

## 2011-09-03 LAB — CBC
Hemoglobin: 17.5 g/dL — ABNORMAL HIGH (ref 13.0–17.0)
MCV: 86.2 fL (ref 78.0–100.0)
Platelets: 137 10*3/uL — ABNORMAL LOW (ref 150–400)
RBC: 5.74 MIL/uL (ref 4.22–5.81)
WBC: 6.4 10*3/uL (ref 4.0–10.5)

## 2011-09-03 LAB — BASIC METABOLIC PANEL
CO2: 31 mEq/L (ref 19–32)
Calcium: 9.5 mg/dL (ref 8.4–10.5)
Chloride: 98 mEq/L (ref 96–112)
Potassium: 3.9 mEq/L (ref 3.5–5.1)
Sodium: 138 mEq/L (ref 135–145)

## 2011-09-03 LAB — DIFFERENTIAL
Eosinophils Relative: 3 % (ref 0–5)
Lymphocytes Relative: 24 % (ref 12–46)
Lymphs Abs: 1.5 10*3/uL (ref 0.7–4.0)
Monocytes Relative: 5 % (ref 3–12)
Neutrophils Relative %: 68 % (ref 43–77)

## 2011-09-03 LAB — POCT I-STAT TROPONIN I: Troponin i, poc: 0.01 ng/mL (ref 0.00–0.08)

## 2011-09-03 MED ORDER — HYDROMORPHONE HCL 2 MG PO TABS: 2.0000 mg | ORAL_TABLET | ORAL | Status: AC | PRN

## 2011-09-03 NOTE — Discharge Instructions (Signed)
Chest Wall Pain Chest wall pain is pain in or around the bones and muscles of your chest. It may take up to 6 weeks to get better. It may take longer if you must stay physically active in your work and activities.  CAUSES  Chest wall pain may happen on its own. However, it may be caused by:  A viral illness like the flu.   Injury.   Coughing.   Exercise.   Arthritis.   Fibromyalgia.   Shingles.  HOME CARE INSTRUCTIONS   Avoid overtiring physical activity. Try not to strain or perform activities that cause pain. This includes any activities using your chest or your abdominal and side muscles, especially if heavy weights are used.   Put ice on the sore area.   Put ice in a plastic bag.   Place a towel between your skin and the bag.   Leave the ice on for 15 to 20 minutes per hour while awake for the first 2 days.   Only take over-the-counter or prescription medicines for pain, discomfort, or fever as directed by your caregiver.  SEEK IMMEDIATE MEDICAL CARE IF:   Your pain increases, or you are very uncomfortable.   You have a fever.   Your chest pain becomes worse.   You have new, unexplained symptoms.   You have nausea or vomiting.   You feel sweaty or lightheaded.   You have a cough with phlegm (sputum), or you cough up blood.  MAKE SURE YOU:   Understand these instructions.   Will watch your condition.   Will get help right away if you are not doing well or get worse.  Document Released: 03/01/2005 Document Revised: 02/18/2011 Document Reviewed: 10/26/2010 [employer] Patient Information 2012 [employer]. 

## 2011-09-03 NOTE — ED Provider Notes (Addendum)
History    This chart was scribed for Shelda Jakes, MD, MD by Smitty Pluck. The patient was seen in room APA06 and the patient's care was started at 9:58AM.   CSN: [id number]  Arrival date & time 09/03/11  0920   First MD Initiated Contact with Patient 09/03/11 [phone]      Chief Complaint  Patient presents with  . Chest Pain    (Consider location/radiation/quality/duration/timing/severity/associated sxs/prior treatment) The history is provided by the patient.   Melvin Hart is a 66 y.o. male who presents to the Emergency Department complaining of moderate intermittent left lateral chest pain onset 2-3 months worsening past couple of days. Pain is sharp. Pain is aggravated by cough, breathing and movement of left arm. Pt has hx of COPD. Pt reports taking aspirin without relief. Denies radiation.  PCP is DR. Rivis at [facility] in [facility]   Past Medical History  Diagnosis Date  . 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 COPD (chronic obstructive pulmonary disease) 09/22/2010  . History of Venereal disease 10/12/2010  . History of Gout 10/12/2010  . Hypertension     Past Surgical History  Procedure Date  . Lymph node biopsy   . Incise and drain abcess     No family history on file.  History  Substance Use Topics  . Smoking status: Current Everyday Smoker -- 1.0 packs/day    Types: Cigarettes  . Smokeless tobacco: Not on file  . Alcohol Use: Yes     daily      Review of Systems  Constitutional: Negative for fever and chills.  HENT: Positive for congestion. Negative for sore throat.   Eyes: Negative for visual disturbance.  Respiratory: Positive for cough.   Cardiovascular: Positive for chest pain.  Gastrointestinal: Negative for nausea, vomiting, abdominal pain and diarrhea.  Genitourinary: Negative for dysuria.  Skin: Negative for rash.  Neurological: Negative for headaches.    Allergies  Cephalexin and Codeine  Home Medications   Current  Outpatient Rx  Name Route Sig Dispense Refill  . IPRATROPIUM-ALBUTEROL 18-103 MCG/ACT IN AERO Inhalation Inhale 2 puffs into the lungs every 6 (six) hours as needed. For shortness of breath    . ASPIRIN EC 81 MG PO TBEC Oral Take 81 mg by mouth daily.    Marland Kitchen DIPHENHYDRAMINE HCL 25 MG PO TABS Oral Take 25 mg by mouth at bedtime as needed. Sleep    . LISINOPRIL 10 MG PO TABS Oral Take 10 mg by mouth daily.    Marland Kitchen HYDROMORPHONE HCL 2 MG PO TABS Oral Take 1 tablet (2 mg total) by mouth every 4 (four) hours as needed for pain. 20 tablet 0  . PREDNISONE 10 MG PO TABS Oral Take by mouth as directed. 6, 5, 4, 3, 2 then 1 tablet by mouth daily for 6 days total.      BP 139/87  Pulse 95  Temp 98.5 F (36.9 C) (Oral)  SpO2 95%  Physical Exam  Nursing note and vitals reviewed. Constitutional: He is oriented to person, place, and time. He appears well-developed and well-nourished.  HENT:  Head: Normocephalic and atraumatic.  Mouth/Throat: Oropharynx is clear and moist.  Eyes: Pupils are equal, round, and reactive to light.  Cardiovascular: Normal rate, regular rhythm and normal heart sounds.   Pulmonary/Chest: He has no wheezes. He has rhonchi (bilateral).  Neurological: He is alert and oriented to person, place, and time. No cranial nerve deficit.  Skin: Skin is warm and dry.  Psychiatric: He has a normal mood and affect. His behavior is normal.    ED Course  Procedures (including critical care time) DIAGNOSTIC STUDIES: Oxygen Saturation is 95% on room air, normal by my interpretation.    COORDINATION OF CARE: 10:02AM EDP discusses pt ED treatment with pt.    Labs Reviewed  BASIC METABOLIC PANEL - Abnormal; Notable for the following:    Glucose, Bld 132 (*)     GFR calc non Af Amer 76 (*)     GFR calc Af Amer 88 (*)     All other components within normal limits  CBC - Abnormal; Notable for the following:    Hemoglobin 17.5 (*)     Platelets 137 (*)     All other components within  normal limits  DIFFERENTIAL  POCT I-STAT TROPONIN I  D-DIMER, QUANTITATIVE   Dg Chest Portable 1 View  09/03/2011  *RADIOLOGY REPORT*  Clinical Data: Chest pain.  PORTABLE CHEST - 1 VIEW  Comparison: None.  Findings: Heart size and vascularity are normal.  The lungs is somewhat hyperinflated suggesting emphysema.  Slight linear densities at the right lung base probably represent atelectasis or scarring.  Old deformity of several left anterior lateral lower ribs.  IMPRESSION: Probable minimal scarring or atelectasis at the right lung base. Emphysema.  Original Report Authenticated By: Gwynn Burly, M.D.    Date: 09/03/2011  Rate: 98  Rhythm: normal sinus rhythm and premature atrial contractions (PAC)  QRS Axis: normal  Intervals: normal  ST/T Wave abnormalities: nonspecific ST/T changes  Conduction Disutrbances:none  Narrative Interpretation:   Old EKG Reviewed: none available    1. Chest wall pain       MDM  Chest pain workup of consistent with left lateral chest wall pain chest x-ray negative for pneumonia pneumothorax d-dimer negative for pulmonary embolism troponin negative for acute cardiac event EKG without any acute changes. Suspect chest wall pain we'll treat with hydromorphone by mouth for pain relief. Patient followed by [facility].   I personally performed the services described in this documentation, which was scribed in my presence. The recorded information has been reviewed and considered.     Shelda Jakes, MD 09/03/11 1030  Shelda Jakes, MD 09/03/11 2105

## 2011-09-03 NOTE — ED Notes (Signed)
Pt states constant mild pain to left chest area with intermittent, sharp pains which is worse with inspiration. Pain is located just under left breast area. Pain x "a couple of months, but worse the past few days" Hx of COPD.

## 2011-09-03 NOTE — ED Notes (Signed)
Patient with no complaints at this time. Respirations even and unlabored. Skin warm/dry. Discharge instructions reviewed with patient at this time. Patient given opportunity to voice concerns/ask questions. IV removed per policy and band-aid applied to site. Patient discharged at this time and left Emergency Department with steady gait.  

## 2011-09-03 NOTE — ED Notes (Signed)
Patient does not need anything at this time. 

## 2011-09-06 ENCOUNTER — Encounter (HOSPITAL_COMMUNITY): Payer: Non-veteran care

## 2011-09-08 ENCOUNTER — Encounter (HOSPITAL_COMMUNITY)
Admission: RE | Admit: 2011-09-08 | Discharge: 2011-09-08 | Disposition: A | Discharge status: Home / Self Care | Payer: Non-veteran care | Source: Ambulatory Visit | Attending: Family Medicine | Admitting: Family Medicine

## 2011-09-13 ENCOUNTER — Encounter (HOSPITAL_COMMUNITY)
Admission: RE | Admit: 2011-09-13 | Discharge: 2011-09-13 | Disposition: A | Discharge status: Home / Self Care | Payer: Non-veteran care | Source: Ambulatory Visit | Attending: Oncology | Admitting: Oncology

## 2011-09-13 DIAGNOSIS — J984 Other disorders of lung: Secondary | ICD-10-CM | POA: Insufficient documentation

## 2011-09-13 DIAGNOSIS — D751 Secondary polycythemia: Secondary | ICD-10-CM | POA: Insufficient documentation

## 2011-09-15 ENCOUNTER — Encounter (HOSPITAL_COMMUNITY)
Admission: RE | Admit: 2011-09-15 | Discharge: 2011-09-15 | Disposition: A | Discharge status: Home / Self Care | Payer: Non-veteran care | Source: Ambulatory Visit | Attending: Family Medicine | Admitting: Family Medicine

## 2011-09-20 ENCOUNTER — Encounter (HOSPITAL_COMMUNITY)
Admission: RE | Admit: 2011-09-20 | Discharge: 2011-09-20 | Disposition: A | Discharge status: Home / Self Care | Payer: Non-veteran care | Source: Ambulatory Visit | Attending: Family Medicine | Admitting: Family Medicine

## 2011-09-22 ENCOUNTER — Encounter (HOSPITAL_COMMUNITY): Payer: Non-veteran care

## 2011-09-27 ENCOUNTER — Encounter (HOSPITAL_COMMUNITY)
Admission: RE | Admit: 2011-09-27 | Discharge: 2011-09-27 | Disposition: A | Discharge status: Home / Self Care | Payer: Non-veteran care | Source: Ambulatory Visit | Attending: Family Medicine | Admitting: Family Medicine

## 2011-09-29 ENCOUNTER — Encounter (HOSPITAL_COMMUNITY)
Admission: RE | Admit: 2011-09-29 | Discharge: 2011-09-29 | Disposition: A | Discharge status: Home / Self Care | Payer: Non-veteran care | Source: Ambulatory Visit | Attending: Family Medicine | Admitting: Family Medicine

## 2011-10-04 ENCOUNTER — Encounter (HOSPITAL_COMMUNITY): Payer: Non-veteran care

## 2011-10-06 ENCOUNTER — Encounter (HOSPITAL_COMMUNITY)
Admission: RE | Admit: 2011-10-06 | Discharge: 2011-10-06 | Disposition: A | Discharge status: Home / Self Care | Payer: Non-veteran care | Source: Ambulatory Visit | Attending: Family Medicine | Admitting: Family Medicine

## 2011-10-11 ENCOUNTER — Encounter (HOSPITAL_COMMUNITY)
Admission: RE | Admit: 2011-10-11 | Discharge: 2011-10-11 | Disposition: A | Discharge status: Home / Self Care | Payer: Non-veteran care | Source: Ambulatory Visit | Attending: Family Medicine | Admitting: Family Medicine

## 2011-10-13 ENCOUNTER — Encounter (HOSPITAL_COMMUNITY)
Admission: RE | Admit: 2011-10-13 | Discharge: 2011-10-13 | Disposition: A | Discharge status: Home / Self Care | Payer: Non-veteran care | Source: Ambulatory Visit | Attending: Family Medicine | Admitting: Family Medicine

## 2011-10-18 ENCOUNTER — Encounter (HOSPITAL_COMMUNITY)
Admission: RE | Admit: 2011-10-18 | Discharge: 2011-10-18 | Disposition: A | Discharge status: Home / Self Care | Payer: Non-veteran care | Source: Ambulatory Visit | Attending: Oncology | Admitting: Oncology

## 2011-10-18 DIAGNOSIS — J984 Other disorders of lung: Secondary | ICD-10-CM | POA: Insufficient documentation

## 2011-10-18 DIAGNOSIS — D751 Secondary polycythemia: Secondary | ICD-10-CM | POA: Insufficient documentation

## 2011-10-20 ENCOUNTER — Encounter (HOSPITAL_COMMUNITY): Payer: Non-veteran care

## 2011-10-25 ENCOUNTER — Encounter (HOSPITAL_COMMUNITY)
Admission: RE | Admit: 2011-10-25 | Discharge: 2011-10-25 | Disposition: A | Discharge status: Home / Self Care | Payer: Non-veteran care | Source: Ambulatory Visit | Attending: Family Medicine | Admitting: Family Medicine

## 2011-10-27 ENCOUNTER — Encounter (HOSPITAL_COMMUNITY): Payer: Non-veteran care

## 2011-11-01 ENCOUNTER — Encounter (HOSPITAL_COMMUNITY)
Admission: RE | Admit: 2011-11-01 | Discharge: 2011-11-01 | Disposition: A | Discharge status: Home / Self Care | Payer: Non-veteran care | Source: Ambulatory Visit | Attending: Family Medicine | Admitting: Family Medicine

## 2011-11-03 ENCOUNTER — Encounter (HOSPITAL_COMMUNITY)
Admission: RE | Admit: 2011-11-03 | Discharge: 2011-11-03 | Disposition: A | Discharge status: Home / Self Care | Payer: Non-veteran care | Source: Ambulatory Visit | Attending: Family Medicine | Admitting: Family Medicine

## 2011-11-08 ENCOUNTER — Encounter (HOSPITAL_COMMUNITY)
Admission: RE | Admit: 2011-11-08 | Discharge: 2011-11-08 | Disposition: A | Discharge status: Home / Self Care | Payer: Non-veteran care | Source: Ambulatory Visit | Attending: Family Medicine | Admitting: Family Medicine

## 2011-11-10 ENCOUNTER — Encounter (HOSPITAL_COMMUNITY)
Admission: RE | Admit: 2011-11-10 | Discharge: 2011-11-10 | Disposition: A | Discharge status: Home / Self Care | Payer: Non-veteran care | Source: Ambulatory Visit | Attending: Family Medicine | Admitting: Family Medicine

## 2011-11-15 ENCOUNTER — Encounter (HOSPITAL_COMMUNITY): Payer: Medicare Other

## 2011-11-17 ENCOUNTER — Encounter (HOSPITAL_COMMUNITY): Payer: Medicare Other

## 2011-11-22 ENCOUNTER — Encounter (HOSPITAL_COMMUNITY)
Admission: RE | Admit: 2011-11-22 | Discharge: 2011-11-22 | Disposition: A | Discharge status: Home / Self Care | Payer: Medicare Other | Source: Ambulatory Visit | Attending: Oncology | Admitting: Oncology

## 2011-11-22 DIAGNOSIS — D751 Secondary polycythemia: Secondary | ICD-10-CM | POA: Insufficient documentation

## 2011-11-22 DIAGNOSIS — J984 Other disorders of lung: Secondary | ICD-10-CM | POA: Insufficient documentation

## 2011-11-23 ENCOUNTER — Other Ambulatory Visit (HOSPITAL_COMMUNITY): Payer: Self-pay | Admitting: *Deleted

## 2011-11-23 DIAGNOSIS — D751 Secondary polycythemia: Secondary | ICD-10-CM

## 2011-11-24 ENCOUNTER — Other Ambulatory Visit (HOSPITAL_COMMUNITY): Payer: Self-pay | Admitting: Oncology

## 2011-11-24 ENCOUNTER — Ambulatory Visit (HOSPITAL_COMMUNITY): Payer: Non-veteran care

## 2011-11-24 ENCOUNTER — Encounter (HOSPITAL_COMMUNITY)
Admission: RE | Admit: 2011-11-24 | Discharge: 2011-11-24 | Disposition: A | Discharge status: Home / Self Care | Payer: Medicare Other | Source: Ambulatory Visit | Attending: Family Medicine | Admitting: Family Medicine

## 2011-11-24 ENCOUNTER — Encounter (HOSPITAL_COMMUNITY): Payer: Medicare Other | Admitting: Oncology

## 2011-11-24 ENCOUNTER — Encounter (HOSPITAL_BASED_OUTPATIENT_CLINIC_OR_DEPARTMENT_OTHER): Payer: Medicare Other

## 2011-11-24 DIAGNOSIS — D751 Secondary polycythemia: Secondary | ICD-10-CM

## 2011-11-24 DIAGNOSIS — J984 Other disorders of lung: Secondary | ICD-10-CM

## 2011-11-24 LAB — CBC
Platelets: 154 10*3/uL (ref 150–400)
RBC: 5.2 MIL/uL (ref 4.22–5.81)
WBC: 9 10*3/uL (ref 4.0–10.5)

## 2011-11-24 NOTE — Progress Notes (Signed)
Labs drawn today for cbc 

## 2011-11-24 NOTE — Progress Notes (Signed)
Melvin Hart presents today for phlebotomy per MD orders. Phlebotomy procedure started at 1427 and ended at 1442. 500 cc removed. Patient tolerated procedure well. IV needle removed intact.

## 2011-11-29 ENCOUNTER — Encounter (HOSPITAL_COMMUNITY): Payer: Medicare Other

## 2011-11-29 ENCOUNTER — Ambulatory Visit (HOSPITAL_COMMUNITY): Payer: Non-veteran care

## 2011-12-01 ENCOUNTER — Ambulatory Visit (HOSPITAL_COMMUNITY): Payer: Non-veteran care

## 2011-12-01 ENCOUNTER — Encounter (HOSPITAL_COMMUNITY): Payer: Medicare Other

## 2011-12-06 ENCOUNTER — Ambulatory Visit (HOSPITAL_COMMUNITY): Payer: Non-veteran care

## 2011-12-06 ENCOUNTER — Encounter (HOSPITAL_COMMUNITY): Payer: Medicare Other

## 2011-12-08 ENCOUNTER — Encounter (HOSPITAL_COMMUNITY): Payer: Medicare Other

## 2011-12-08 ENCOUNTER — Ambulatory Visit (HOSPITAL_COMMUNITY): Payer: Non-veteran care

## 2011-12-08 ENCOUNTER — Encounter (HOSPITAL_BASED_OUTPATIENT_CLINIC_OR_DEPARTMENT_OTHER): Payer: Medicare Other

## 2011-12-08 VITALS — BP 116/76 | HR 85

## 2011-12-08 DIAGNOSIS — D751 Secondary polycythemia: Secondary | ICD-10-CM

## 2011-12-08 NOTE — Progress Notes (Signed)
[name] presents today for phlebotomy per MD orders. Phlebotomy procedure started at 0941and ended at 1001. 500 cc removed. Patient tolerated procedure well. IV needle removed intact.

## 2011-12-13 ENCOUNTER — Ambulatory Visit (HOSPITAL_COMMUNITY): Payer: Non-veteran care

## 2011-12-13 ENCOUNTER — Encounter (HOSPITAL_COMMUNITY): Payer: Medicare Other

## 2012-01-05 ENCOUNTER — Other Ambulatory Visit (HOSPITAL_COMMUNITY): Payer: Medicare Other

## 2012-01-19 ENCOUNTER — Other Ambulatory Visit (HOSPITAL_COMMUNITY): Payer: Self-pay | Admitting: Oncology

## 2012-01-19 ENCOUNTER — Encounter (HOSPITAL_COMMUNITY): Payer: Medicare Other | Attending: Oncology

## 2012-01-19 DIAGNOSIS — D751 Secondary polycythemia: Secondary | ICD-10-CM

## 2012-01-19 DIAGNOSIS — J984 Other disorders of lung: Secondary | ICD-10-CM | POA: Insufficient documentation

## 2012-01-19 LAB — CBC
HCT: 50.3 % (ref 39.0–52.0)
Hemoglobin: 17.7 g/dL — ABNORMAL HIGH (ref 13.0–17.0)
WBC: 7.2 10*3/uL (ref 4.0–10.5)

## 2012-01-19 NOTE — Progress Notes (Signed)
Labs drawn today for cbc 

## 2012-01-26 ENCOUNTER — Encounter (HOSPITAL_BASED_OUTPATIENT_CLINIC_OR_DEPARTMENT_OTHER): Payer: Medicare Other

## 2012-01-26 DIAGNOSIS — J984 Other disorders of lung: Secondary | ICD-10-CM

## 2012-01-26 DIAGNOSIS — D751 Secondary polycythemia: Secondary | ICD-10-CM

## 2012-01-26 NOTE — Progress Notes (Signed)
Melvin Hart presents today for phlebotomy per MD orders. Phlebotomy procedure started at 1120 and ended at 1127. 500 cc removed. Patient tolerated procedure well. IV needle removed intact.

## 2012-02-02 ENCOUNTER — Encounter (HOSPITAL_BASED_OUTPATIENT_CLINIC_OR_DEPARTMENT_OTHER): Payer: Medicare Other

## 2012-02-02 DIAGNOSIS — J984 Other disorders of lung: Secondary | ICD-10-CM

## 2012-02-02 DIAGNOSIS — D751 Secondary polycythemia: Secondary | ICD-10-CM

## 2012-02-02 NOTE — Progress Notes (Signed)
Melvin Hart presents today for phlebotomy per MD orders. Phlebotomy procedure started at 1010 and ended at 1020.   500 cc removed. Patient tolerated procedure well. IV needle removed intact.

## 2012-02-09 ENCOUNTER — Encounter (HOSPITAL_COMMUNITY): Payer: Medicare Other

## 2012-02-23 ENCOUNTER — Encounter (HOSPITAL_BASED_OUTPATIENT_CLINIC_OR_DEPARTMENT_OTHER): Payer: Medicare Other

## 2012-02-23 ENCOUNTER — Encounter (HOSPITAL_COMMUNITY): Payer: Medicare Other | Attending: Oncology | Admitting: Oncology

## 2012-02-23 ENCOUNTER — Other Ambulatory Visit (HOSPITAL_COMMUNITY): Payer: Medicare Other

## 2012-02-23 VITALS — BP 139/81 | HR 85 | Temp 99.1°F | Resp 18 | Wt 179.1 lb

## 2012-02-23 DIAGNOSIS — D751 Secondary polycythemia: Secondary | ICD-10-CM | POA: Insufficient documentation

## 2012-02-23 DIAGNOSIS — J449 Chronic obstructive pulmonary disease, unspecified: Secondary | ICD-10-CM

## 2012-02-23 DIAGNOSIS — J984 Other disorders of lung: Secondary | ICD-10-CM | POA: Insufficient documentation

## 2012-02-23 LAB — CBC
HCT: 45.7 % (ref 39.0–52.0)
Hemoglobin: 15.8 g/dL (ref 13.0–17.0)
MCH: 29.6 pg (ref 26.0–34.0)
MCV: 85.6 fL (ref 78.0–100.0)
RBC: 5.34 MIL/uL (ref 4.22–5.81)

## 2012-02-23 NOTE — Patient Instructions (Addendum)
[facility] Discharge Instructions  RECOMMENDATIONS MADE BY THE CONSULTANT AND ANY TEST RESULTS WILL BE SENT TO YOUR REFERRING PHYSICIAN.  EXAM FINDINGS BY THE PHYSICIAN TODAY AND SIGNS OR SYMPTOMS TO REPORT TO CLINIC OR PRIMARY PHYSICIAN: exam and discussion by PA.  He is concerned about your weight loss so we need to keep a check on you.  MEDICATIONS PRESCRIBED:  none  INSTRUCTIONS GIVEN AND DISCUSSED: Report any episodes of stroke-like symptoms (after going to ED) difficulty speaking, confusion, loss of use or extremity, etc.  SPECIAL INSTRUCTIONS/FOLLOW-UP: Lab work every 4 weeks and to see PA in 4 months.  Thank you for choosing [facility] to provide your oncology and hematology care.  To afford each patient quality time with our providers, please arrive at least 15 minutes before your scheduled appointment time.  With your help, our goal is to use those 15 minutes to complete the necessary work-up to ensure our physicians have the information they need to help with your evaluation and healthcare recommendations.    Effective January 1st, 2014, we ask that you re-schedule your appointment with our physicians should you arrive 10 or more minutes late for your appointment.  We strive to give you quality time with our providers, and arriving late affects you and other patients whose appointments are after yours.    Again, thank you for choosing [facility].  Our hope is that these requests will decrease the amount of time that you wait before being seen by our physicians.       _____________________________________________________________  I acknowledge that I have been informed and understand all the instructions given to me and received a copy. I do not have anymore questions at this time but understand that I may call the Cancer Center at [facility] at [phone] during business hours should I have any further questions or need  assistance in obtaining follow-up care.    __________________________________________  _____________  __________ Signature of Patient or Authorized Representative            Date                   Time    __________________________________________ Nurse's Signature

## 2012-02-23 NOTE — Progress Notes (Signed)
Labs drawn today for cbc 

## 2012-02-23 NOTE — Progress Notes (Signed)
RIVIS,LILANA, MD No address on file  1. 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  He was last seen in December 2012 with a 6 week follow-up appointment.  He failed to show and is now returning today for a follow-up appointment.   Jarad was in the ED in June with arm weakness.  MRI showed a bulging spinal disc which explained his symptoms.    He is S/P therapeutic phlebotomies as described above for his erythrocytosis.    To me, Jaicob appears to have lost weight, but his vitals do not reflect a significant weight loss. In 1 year, he has lost 5% of his body weight.  He weighs 179 lbs today compared to 188 lbs in December 2012.  So we will keep an eye on his weight.  If he continues to lose weight, I think that would warrant imaging studies due to his strong smoking history.  He reports his appetite is stable.  He denies any nausea, vomiting, or diarrhea.   He continues to smoke 1-2 ppd.  He denies any complaints.  Complete ROS questioning is negative.  He denies any hemoptysis.   Past Medical History  Diagnosis Date  . 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 COPD (chronic obstructive pulmonary disease) 09/22/2010  . History of Venereal disease 10/12/2010  . History of Gout 10/12/2010  . 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     is allergic to cephalexin and codeine.  Mr. Delucia does not currently have medications on file.  Past Surgical History  Procedure Date  . Lymph node biopsy   . Incise and drain abcess     Denies any headaches, dizziness, double vision, fevers, chills, night sweats, nausea, vomiting,  diarrhea, constipation, chest pain, heart palpitations, shortness of breath, blood in stool, black tarry stool, urinary pain, urinary burning, urinary frequency, hematuria.   PHYSICAL EXAMINATION  ECOG PERFORMANCE STATUS: 1 - Symptomatic but completely ambulatory  Filed Vitals:   02/23/12 1127  BP: 139/81  Pulse: 85  Temp: 99.1 F (37.3 C)  Resp: 18    GENERAL:alert, no distress, well nourished, well developed, comfortable, cooperative, obese, smiling and chronically ill appearing SKIN: skin color, texture, turgor are normal, no rashes or significant lesions HEAD: Normocephalic, No masses, lesions, tenderness or abnormalities EYES: normal, Conjunctiva are pink and non-injected EARS: External ears normal OROPHARYNX:mucous membranes are moist  NECK: supple, no adenopathy, trachea midline LYMPH:  no palpable lymphadenopathy BREAST:not examined LUNGS: Severely decreased breath sounds throughout with minimal air movement HEART: regular rate & rhythm, no murmurs, no gallops, S1 normal and S2 normal ABDOMEN:abdomen soft, non-tender, obese, normal bowel sounds and no masses or organomegaly BACK: Back symmetric, no curvature., No CVA tenderness EXTREMITIES:less then 2 second capillary refill, no joint deformities, effusion, or inflammation, no edema, no skin discoloration  NEURO: alert & oriented x 3 with fluent speech, no focal motor/sensory deficits, gait normal   LABORATORY DATA: CBC    Component Value Date/Time   WBC 7.2 01/19/2012 1022   RBC 5.66 01/19/2012 1022   HGB 17.7* 01/19/2012 1022   HCT 50.3 01/19/2012 1022   PLT 197 01/19/2012 1022   MCV 88.9 01/19/2012 1022  MCH 31.3 01/19/2012 1022   MCHC 35.2 01/19/2012 1022   RDW 13.5 01/19/2012 1022   LYMPHSABS 1.5 09/03/2011 0940   MONOABS 0.3 09/03/2011 0940   EOSABS 0.2 09/03/2011 0940   BASOSABS 0.1 09/03/2011 0940       PENDING LABS: CBC    ASSESSMENT:  1. 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  2. COPD 3.  Weight loss of 5% of body weight x 1 year.  Weighed 188 in December 2012, now 179 lbs.     PLAN:  1. I personally reviewed and went over laboratory results with the patient. 2. Labs every 4 weeks: CBC 3. Will monitor weight.  If it continues to decline, we need to consider potential causes including malignancy. 4. Return in 4 weeks for follow-up.  Will perform therapeutic phlebotomies as indicted with lab work.    All questions were answered. The patient knows to call the clinic with any problems, questions or concerns. We can certainly see the patient much sooner if necessary.  The patient and plan discussed with Glenford Peers, MD and he is in agreement with the aforementioned.  KEFALAS,THOMAS

## 2012-03-01 ENCOUNTER — Encounter (HOSPITAL_COMMUNITY): Payer: Medicare Other

## 2012-03-22 ENCOUNTER — Encounter (HOSPITAL_COMMUNITY): Payer: Medicare Other | Attending: Oncology

## 2012-03-22 DIAGNOSIS — J984 Other disorders of lung: Secondary | ICD-10-CM | POA: Insufficient documentation

## 2012-03-22 DIAGNOSIS — D751 Secondary polycythemia: Secondary | ICD-10-CM

## 2012-03-22 LAB — CBC
HCT: 46.3 % (ref 39.0–52.0)
MCHC: 33.9 g/dL (ref 30.0–36.0)
MCV: 83.7 fL (ref 78.0–100.0)
RDW: 14.7 % (ref 11.5–15.5)

## 2012-03-22 NOTE — Progress Notes (Signed)
Labs drawn today for cbc 

## 2012-03-24 ENCOUNTER — Encounter (HOSPITAL_BASED_OUTPATIENT_CLINIC_OR_DEPARTMENT_OTHER): Payer: Medicare Other

## 2012-03-24 VITALS — BP 137/87 | HR 103

## 2012-03-24 DIAGNOSIS — J984 Other disorders of lung: Secondary | ICD-10-CM

## 2012-03-24 DIAGNOSIS — D751 Secondary polycythemia: Secondary | ICD-10-CM

## 2012-03-24 NOTE — Progress Notes (Signed)
Melvin Hart presents today for phlebotomy per MD orders. Phlebotomy procedure started at 1000 and ended at 1012. 500 cc removed. Patient tolerated procedure well. IV needle removed intact.

## 2012-03-29 ENCOUNTER — Encounter (HOSPITAL_COMMUNITY): Payer: Medicare Other

## 2012-03-31 ENCOUNTER — Encounter (HOSPITAL_BASED_OUTPATIENT_CLINIC_OR_DEPARTMENT_OTHER): Payer: Medicare Other

## 2012-03-31 VITALS — BP 127/74 | HR 105

## 2012-03-31 DIAGNOSIS — J984 Other disorders of lung: Secondary | ICD-10-CM

## 2012-03-31 DIAGNOSIS — D751 Secondary polycythemia: Secondary | ICD-10-CM

## 2012-03-31 NOTE — Progress Notes (Signed)
Melvin Hart presents today for phlebotomy per MD orders. Phlebotomy procedure started at  1019 and ended at 1030. 500 cc removed. Patient tolerated procedure well. IV needle removed intact.

## 2012-04-19 NOTE — Progress Notes (Signed)
Pulmonary Rehabilitation Program Outcomes Report   Orientation:  07/20/2011 1 st visit Report: 09/27/2011 Graduate Date:  tbd Discharge Date:  tbd # of sessions completed: 12 DX: COPD  Pulmonologist: Plitman Family MD:  [name] Class Time:  13:00  A.  Exercise Program:  Tolerates exercise @ 5.31 METS for 15 minutes  B.  Mental Health:  Good mental attitude  C.  Education/Instruction/Skills  Accurately checks own pulse.  Rest:  79  Exercise:  88, Knows THR for exercise and Uses Perceived Exertion Scale and/or Dyspnea Scale  Uses Perceived Exertion Scale and/or Dyspnea Scale  D.  Nutrition/Weight Control/Body Composition:  Adherence to prescribed nutrition program: good    E.  Blood Lipids    No results found for this basename: CHOL, HDL, LDLCALC, LDLDIRECT, TRIG, CHOLHDL    F.  Lifestyle Changes:  Making positive lifestyle changes  G.  Symptoms noted with exercise:  Asymptomatic  Report Completed By:  Melvin Hart. Melvin Acoff RN   Comments:  This is patients Halfway report. He achieved a peak METS of 5.31 . His resting HR was 79 and BP was 110/62, His Peak HR was 88 and peak BP 140/80. A graduation report will follow on his 24th visit.

## 2012-04-19 NOTE — Progress Notes (Signed)
Pulmonary Rehabilitation Program Outcomes Report   Orientation:  07/20/2011 1st week report: 08/18/2011 Graduate Date:  tbd Discharge Date:  tbd # of sessions completed: 3 Dx: COPD  Pulmonologist: Plitman Family MD:  [name] Class Time:  13:00  A.  Exercise Program:  Tolerates exercise @ 5.28 METS for 15 minutes and Walk Test Results:  Pre: Pre 6 minute walk test: Resting HR 81, BP 110/60, O2 95%, RPE 9 and RPD 9, ^minute HR 111, BP 120/80, O2  92%, RPE 13 and RPD 12, Post HR 73, BP 120/70, O2 97, RPE 9, and RPD 9, Walked 1200 sgFT at 2.3 mph.  B.  Mental Health:  Good mental attitude  C.  Education/Instruction/Skills  Accurately checks own pulse.  Rest:  71  Exercise: 105, Knows THR for exercise and Uses Perceived Exertion Scale and/or Dyspnea Scale  Uses Perceived Exertion Scale and/or Dyspnea Scale  D.  Nutrition/Weight Control/Body Composition:  Adherence to prescribed nutrition program: good    E.  Blood Lipids    No results found for this basename: CHOL, HDL, LDLCALC, LDLDIRECT, TRIG, CHOLHDL    F.  Lifestyle Changes:  Making positive lifestyle changes  G.  Symptoms noted with exercise:  Asymptomatic  Report Completed By:  Lelon Huh. Jaidyn Usery RN   Comments:  This is patients 1st  Week  Report. He achieved peak METS of 5.28. His resting HR was  96 and BP was 110/58 and his peak HR was 114 and Peak BP was 138/78. A half way report will follow on his 12 th visit.

## 2012-04-25 ENCOUNTER — Encounter (HOSPITAL_COMMUNITY): Payer: Non-veteran care | Attending: Oncology

## 2012-04-25 DIAGNOSIS — J984 Other disorders of lung: Secondary | ICD-10-CM | POA: Insufficient documentation

## 2012-04-25 DIAGNOSIS — D751 Secondary polycythemia: Secondary | ICD-10-CM | POA: Insufficient documentation

## 2012-05-23 ENCOUNTER — Encounter (HOSPITAL_COMMUNITY): Payer: Medicare Other | Attending: Oncology

## 2012-05-23 DIAGNOSIS — J984 Other disorders of lung: Secondary | ICD-10-CM | POA: Insufficient documentation

## 2012-05-23 DIAGNOSIS — D751 Secondary polycythemia: Secondary | ICD-10-CM

## 2012-05-23 LAB — CBC
Hemoglobin: 14.5 g/dL (ref 13.0–17.0)
MCH: 25.3 pg — ABNORMAL LOW (ref 26.0–34.0)
MCHC: 32.4 g/dL (ref 30.0–36.0)
Platelets: 261 10*3/uL (ref 150–400)

## 2012-05-23 NOTE — Progress Notes (Signed)
Labs drawn today for cbc 

## 2012-06-22 ENCOUNTER — Encounter (HOSPITAL_COMMUNITY): Payer: Medicare Other | Attending: Oncology

## 2012-06-22 DIAGNOSIS — J4489 Other specified chronic obstructive pulmonary disease: Secondary | ICD-10-CM | POA: Insufficient documentation

## 2012-06-22 DIAGNOSIS — R634 Abnormal weight loss: Secondary | ICD-10-CM | POA: Insufficient documentation

## 2012-06-22 DIAGNOSIS — J449 Chronic obstructive pulmonary disease, unspecified: Secondary | ICD-10-CM | POA: Insufficient documentation

## 2012-06-22 DIAGNOSIS — D751 Secondary polycythemia: Secondary | ICD-10-CM | POA: Insufficient documentation

## 2012-06-22 DIAGNOSIS — J984 Other disorders of lung: Secondary | ICD-10-CM | POA: Insufficient documentation

## 2012-06-22 LAB — CBC
HCT: 46.5 % (ref 39.0–52.0)
Hemoglobin: 15.4 g/dL (ref 13.0–17.0)
MCHC: 33.1 g/dL (ref 30.0–36.0)
MCV: 77.4 fL — ABNORMAL LOW (ref 78.0–100.0)
RDW: 17.4 % — ABNORMAL HIGH (ref 11.5–15.5)

## 2012-06-22 NOTE — Progress Notes (Signed)
Labs drawn today for cbc 

## 2012-06-23 ENCOUNTER — Other Ambulatory Visit (HOSPITAL_COMMUNITY): Payer: Medicare Other

## 2012-06-26 ENCOUNTER — Encounter (HOSPITAL_BASED_OUTPATIENT_CLINIC_OR_DEPARTMENT_OTHER): Payer: Medicare Other | Admitting: Oncology

## 2012-06-26 ENCOUNTER — Encounter (HOSPITAL_BASED_OUTPATIENT_CLINIC_OR_DEPARTMENT_OTHER): Payer: Medicare Other

## 2012-06-26 VITALS — BP 122/81 | HR 93 | Temp 98.2°F | Resp 20

## 2012-06-26 DIAGNOSIS — D751 Secondary polycythemia: Secondary | ICD-10-CM

## 2012-06-26 DIAGNOSIS — R634 Abnormal weight loss: Secondary | ICD-10-CM

## 2012-06-26 DIAGNOSIS — J984 Other disorders of lung: Secondary | ICD-10-CM

## 2012-06-26 DIAGNOSIS — J449 Chronic obstructive pulmonary disease, unspecified: Secondary | ICD-10-CM

## 2012-06-26 NOTE — Progress Notes (Signed)
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  Last therapeutic phlebotomies were on 1/10 and 03/31/12.  INTERVAL HISTORY: Melvin Hart 67 y.o. 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   I personally reviewed and went over laboratory results with the patient.  Hgb is 15.4 g/dL and HCT is 78.2%.  As a result, we phlebotomize him today as scheduled.   He continues to smoke cigarettes, but he reports that he is going to quit today with the aid of nicoderm patches provided to him through the [facility]. I have encouraged him to quit and wished him luck on this healthy choice he is making.   He denies any complaints including hemoptysis.  He reports that his appetite is strong.    Hematologically, he denies any complaints and ROS questioning is negative.    Past Medical History  Diagnosis Date  . 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 COPD (chronic obstructive pulmonary disease) 09/22/2010  . History of Venereal disease 10/12/2010  . History of Gout 10/12/2010  . 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     is allergic to cephalexin and codeine.  Melvin Hart does not currently have medications on file.  Past Surgical History  Procedure Laterality Date  . Lymph node biopsy    . Incise and drain abcess      Denies any headaches, dizziness, double vision, fevers, chills, night sweats, nausea, vomiting, diarrhea, constipation, chest pain, heart palpitations, shortness of breath, blood in stool,  black tarry stool, urinary pain, urinary burning, urinary frequency, hematuria.   PHYSICAL EXAMINATION  ECOG PERFORMANCE STATUS: 1 - Symptomatic but completely ambulatory  There were no vitals filed for this visit.  GENERAL:alert, no distress, well nourished, well developed, comfortable, cooperative, obese, smiling and chronically ill appearing  SKIN: skin color, texture, turgor are normal, no rashes or significant lesions  HEAD: Normocephalic, No masses, lesions, tenderness or abnormalities  EYES: normal, Conjunctiva are pink and non-injected  EARS: External ears normal  OROPHARYNX:mucous membranes are moist  NECK: supple, no adenopathy, trachea midline  LYMPH: no palpable lymphadenopathy  BREAST:not examined  LUNGS: Severely decreased breath sounds throughout with minimal air movement  HEART: regular rate & rhythm, no murmurs, no gallops, S1 normal and S2 normal  ABDOMEN:abdomen soft, non-tender, obese, normal bowel sounds and no masses or organomegaly  BACK: Back symmetric, no curvature., No CVA tenderness  EXTREMITIES:less then 2 second capillary refill, no joint deformities, effusion, or inflammation, no edema, no skin discoloration  NEURO: alert & oriented x 3 with fluent speech, no focal motor/sensory deficits, gait normal   LABORATORY DATA: CBC    Component Value Date/Time   WBC 6.5 06/22/2012 1013   RBC 6.01* 06/22/2012 1013   HGB 15.4 06/22/2012 1013   HCT 46.5 06/22/2012 1013   PLT 208 06/22/2012 1013   MCV 77.4* 06/22/2012 1013   MCH 25.6* 06/22/2012 1013   MCHC 33.1 06/22/2012 1013   RDW 17.4* 06/22/2012 1013   LYMPHSABS 1.5 09/03/2011 0940   MONOABS 0.3 09/03/2011 0940  EOSABS 0.2 09/03/2011 0940   BASOSABS 0.1 09/03/2011 0940      ASSESSMENT:  1. 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  2. COPD  3. Weight loss of 9% of body weight x 2 year. Weighed 188 in December 2012, 179 lbs in December 2013 and 177.8 lbs today.    PLAN:  1. I personally reviewed and  went over laboratory results with the patient. 2. Therapeutic phlebotomy today as scheduled.  3. Smoking cessation education provided. He plans on quitting today with the help of nicoderm patches from the [facility].  4. Labs every 4 weeks: CBC 5. I have asked the nurse, Rolly Salter, to weigh the patient today to monitor his weight.  6. Due to his continued weight loss and continued smoking, we will set him up for a CT of chest without contrast. 7. Return in 3 months for follow-up.  All questions were answered. The patient knows to call the clinic with any problems, questions or concerns. We can certainly see the patient much sooner if necessary.  The patient and plan discussed with Glenford Peers, MD and he is in agreement with the aforementioned.  Melvin Hart

## 2012-06-26 NOTE — Progress Notes (Signed)
Melvin Hart presents today for phlebotomy per MD orders. Phlebotomy procedure started at 1011 and ended at 1022. 500cc removed. Patient tolerated procedure well. IV needle removed intact.  Pt's wt 177.8

## 2012-06-26 NOTE — Patient Instructions (Addendum)
[facility] Discharge Instructions  RECOMMENDATIONS MADE BY THE CONSULTANT AND ANY TEST RESULTS WILL BE SENT TO YOUR REFERRING PHYSICIAN.  EXAM FINDINGS BY THE PHYSICIAN TODAY AND SIGNS OR SYMPTOMS TO REPORT TO CLINIC OR PRIMARY PHYSICIAN:   Return in 6 weeks for labs and then to see Dr. Mariel Sleet in July.   Thank you for choosing [facility] to provide your oncology and hematology care.  To afford each patient quality time with our providers, please arrive at least 15 minutes before your scheduled appointment time.  With your help, our goal is to use those 15 minutes to complete the necessary work-up to ensure our physicians have the information they need to help with your evaluation and healthcare recommendations.    Effective January 1st, 2014, we ask that you re-schedule your appointment with our physicians should you arrive 10 or more minutes late for your appointment.  We strive to give you quality time with our providers, and arriving late affects you and other patients whose appointments are after yours.    Again, thank you for choosing [facility].  Our hope is that these requests will decrease the amount of time that you wait before being seen by our physicians.       _____________________________________________________________  Should you have questions after your visit to [facility], please contact our office at [phone] between the hours of 8:30 a.m. and 5:00 p.m.  Voicemails left after 4:30 p.m. will not be returned until the following business day.  For prescription refill requests, have your pharmacy contact our office with your prescription refill request.

## 2012-06-26 NOTE — Addendum Note (Signed)
Addended by: Oda Kilts on: 06/26/2012 01:11 PM   Modules accepted: Orders

## 2012-06-28 ENCOUNTER — Encounter (HOSPITAL_COMMUNITY): Payer: Medicare Other

## 2012-08-08 ENCOUNTER — Encounter (HOSPITAL_COMMUNITY): Payer: Medicare Other | Attending: Oncology

## 2012-08-08 DIAGNOSIS — D751 Secondary polycythemia: Secondary | ICD-10-CM

## 2012-08-08 DIAGNOSIS — J984 Other disorders of lung: Secondary | ICD-10-CM | POA: Insufficient documentation

## 2012-08-08 LAB — COMPREHENSIVE METABOLIC PANEL
ALT: 7 U/L (ref 0–53)
CO2: 31 mEq/L (ref 19–32)
Calcium: 9.2 mg/dL (ref 8.4–10.5)
Creatinine, Ser: 1.29 mg/dL (ref 0.50–1.35)
GFR calc Af Amer: 65 mL/min — ABNORMAL LOW (ref >90)
GFR calc non Af Amer: 56 mL/min — ABNORMAL LOW (ref >90)
Glucose, Bld: 103 mg/dL — ABNORMAL HIGH (ref 70–99)
Sodium: 137 mEq/L (ref 135–145)
Total Protein: 7.2 g/dL (ref 6.0–8.3)

## 2012-08-08 LAB — CBC
Hemoglobin: 15.7 g/dL (ref 13.0–17.0)
MCH: 27.6 pg (ref 26.0–34.0)
MCHC: 34.4 g/dL (ref 30.0–36.0)
MCV: 80.3 fL (ref 78.0–100.0)
Platelets: 181 10*3/uL (ref 150–400)
RBC: 5.69 MIL/uL (ref 4.22–5.81)

## 2012-08-08 NOTE — Progress Notes (Signed)
Labs drawn today for cbc,cmp 

## 2012-08-21 ENCOUNTER — Other Ambulatory Visit (HOSPITAL_COMMUNITY): Payer: Self-pay | Admitting: Oncology

## 2012-08-21 ENCOUNTER — Ambulatory Visit (HOSPITAL_COMMUNITY)
Admission: RE | Admit: 2012-08-21 | Discharge: 2012-08-21 | Disposition: A | Discharge status: Home / Self Care | Payer: Medicare Other | Source: Ambulatory Visit | Attending: Oncology | Admitting: Oncology

## 2012-08-21 ENCOUNTER — Encounter (HOSPITAL_COMMUNITY): Payer: Non-veteran care | Attending: Oncology

## 2012-08-21 VITALS — BP 125/82 | HR 89 | Wt 173.2 lb

## 2012-08-21 DIAGNOSIS — R599 Enlarged lymph nodes, unspecified: Secondary | ICD-10-CM | POA: Insufficient documentation

## 2012-08-21 DIAGNOSIS — J449 Chronic obstructive pulmonary disease, unspecified: Secondary | ICD-10-CM

## 2012-08-21 DIAGNOSIS — D751 Secondary polycythemia: Secondary | ICD-10-CM

## 2012-08-21 DIAGNOSIS — J4489 Other specified chronic obstructive pulmonary disease: Secondary | ICD-10-CM | POA: Insufficient documentation

## 2012-08-21 DIAGNOSIS — J984 Other disorders of lung: Secondary | ICD-10-CM

## 2012-08-21 DIAGNOSIS — R0602 Shortness of breath: Secondary | ICD-10-CM | POA: Insufficient documentation

## 2012-08-21 DIAGNOSIS — R634 Abnormal weight loss: Secondary | ICD-10-CM

## 2012-08-21 NOTE — Progress Notes (Signed)
Melvin Hart presents today for phlebotomy per MD orders. Phlebotomy procedure started at 0929 and ended at 0940. 500 cc removed. Patient tolerated procedure well. IV needle removed intact.

## 2012-09-18 ENCOUNTER — Ambulatory Visit (HOSPITAL_COMMUNITY): Payer: Medicare Other | Admitting: Oncology

## 2012-09-19 ENCOUNTER — Encounter (HOSPITAL_COMMUNITY): Payer: Medicare Other | Attending: Oncology

## 2012-09-19 ENCOUNTER — Encounter (HOSPITAL_BASED_OUTPATIENT_CLINIC_OR_DEPARTMENT_OTHER): Payer: Medicare Other

## 2012-09-19 VITALS — BP 128/77 | HR 99 | Temp 98.3°F | Resp 18

## 2012-09-19 DIAGNOSIS — D751 Secondary polycythemia: Secondary | ICD-10-CM | POA: Insufficient documentation

## 2012-09-19 DIAGNOSIS — J984 Other disorders of lung: Secondary | ICD-10-CM | POA: Insufficient documentation

## 2012-09-19 LAB — CBC
HCT: 48 % (ref 39.0–52.0)
MCH: 29.5 pg (ref 26.0–34.0)
MCV: 85.9 fL (ref 78.0–100.0)
RBC: 5.59 MIL/uL (ref 4.22–5.81)
RDW: 15.8 % — ABNORMAL HIGH (ref 11.5–15.5)
WBC: 6.5 10*3/uL (ref 4.0–10.5)

## 2012-09-19 NOTE — Progress Notes (Signed)
Labs drawn today for cbc 

## 2012-09-19 NOTE — Progress Notes (Signed)
History of present illness:  Patient returns to clinic today for laboratory work, further evaluation, and consideration of additional phlebotomy. He continues to feel well and remains asymptomatic. He has no neurologic complaints. He denies any recent fevers or illnesses. He has a good appetite and denies weight loss. He has no chest pain or shortness of breath. He denies any nausea, vomiting, constipation, or diarrhea. He has no urinary complaints. Patient feels that his baseline and offers no specific complaints today.  Review of systems: As per history of present illness, otherwise a 10 system review is negative.  General:  Well-developed, well-nourished, no acute distress. Mental status: Normal affect. Eyes: Anicteric sclera. Respiratory: Scattered wheezing throughout. Cardiovascular: Regular rate and rhythm, no rubs, murmurs, or gallops. Gastrointestinal: Soft, nontender, nondistended, normoactive bowel sounds. Musculoskeletal: No edema. Skin: No rashes or petechiae noted. Neurological: Alert, answering all questions appropriately. Cranial nerves grossly intact.   Assessment and plan:  1. 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 Return to clinic monthly for laboratory work and phlebotomy and then in 3 months for further evaluation. Patient expressed understanding and was in agreement with this plan. 2. Smoking cessation: Patient continues to smoke, previously was offered smoking cessation counseling. 3. COPD: Treatment per [facility]

## 2012-09-19 NOTE — Patient Instructions (Addendum)
[facility] Discharge Instructions  RECOMMENDATIONS MADE BY THE CONSULTANT AND ANY TEST RESULTS WILL BE SENT TO YOUR REFERRING PHYSICIAN.  EXAM FINDINGS BY THE PHYSICIAN TODAY AND SIGNS OR SYMPTOMS TO REPORT TO CLINIC OR PRIMARY PHYSICIAN: Discussion by Dr. Orlie Dakin.  Will do a phlebotomy tomorrow.  MEDICATIONS PRESCRIBED:  none  INSTRUCTIONS GIVEN AND DISCUSSED: Report unusual bruising or bleeding or any "Stroke-like" symptoms (after going to ED).   SPECIAL INSTRUCTIONS/FOLLOW-UP: Phlebotomy tomorrow, blood work monthly and follow-up in 3 months.  Thank you for choosing [facility] to provide your oncology and hematology care.  To afford each patient quality time with our providers, please arrive at least 15 minutes before your scheduled appointment time.  With your help, our goal is to use those 15 minutes to complete the necessary work-up to ensure our physicians have the information they need to help with your evaluation and healthcare recommendations.    Effective January 1st, 2014, we ask that you re-schedule your appointment with our physicians should you arrive 10 or more minutes late for your appointment.  We strive to give you quality time with our providers, and arriving late affects you and other patients whose appointments are after yours.    Again, thank you for choosing [facility].  Our hope is that these requests will decrease the amount of time that you wait before being seen by our physicians.       _____________________________________________________________  Should you have questions after your visit to [facility], please contact our office at [phone] between the hours of 8:30 a.m. and 5:00 p.m.  Voicemails left after 4:30 p.m. will not be returned until the following business day.  For prescription refill requests, have your pharmacy contact our office with your prescription refill request.

## 2012-09-20 ENCOUNTER — Encounter (HOSPITAL_BASED_OUTPATIENT_CLINIC_OR_DEPARTMENT_OTHER): Payer: Medicare Other

## 2012-09-20 DIAGNOSIS — J984 Other disorders of lung: Secondary | ICD-10-CM

## 2012-09-20 DIAGNOSIS — D751 Secondary polycythemia: Secondary | ICD-10-CM

## 2012-09-20 NOTE — Progress Notes (Signed)
Melvin Hart presents today for phlebotomy per MD orders. Phlebotomy procedure started at 1017 and ended at 1025. 500 cc removed. Patient tolerated procedure well. IV needle removed intact.

## 2012-10-20 ENCOUNTER — Encounter (HOSPITAL_COMMUNITY): Payer: Medicare Other | Attending: Oncology

## 2012-10-20 DIAGNOSIS — D751 Secondary polycythemia: Secondary | ICD-10-CM

## 2012-10-20 DIAGNOSIS — J984 Other disorders of lung: Secondary | ICD-10-CM | POA: Insufficient documentation

## 2012-10-20 LAB — CBC
HCT: 45.9 % (ref 39.0–52.0)
Hemoglobin: 15.4 g/dL (ref 13.0–17.0)
MCH: 29.1 pg (ref 26.0–34.0)
MCHC: 33.6 g/dL (ref 30.0–36.0)

## 2012-10-20 NOTE — Progress Notes (Signed)
Labs drawn today for cbc 

## 2012-10-23 ENCOUNTER — Encounter (HOSPITAL_BASED_OUTPATIENT_CLINIC_OR_DEPARTMENT_OTHER): Payer: Medicare Other

## 2012-10-23 DIAGNOSIS — D751 Secondary polycythemia: Secondary | ICD-10-CM

## 2012-10-23 NOTE — Progress Notes (Signed)
[name] presents today for phlebotomy per MD orders. Phlebotomy procedure started at 0955 and ended at 1010. 500cc removed. Patient tolerated procedure well. IV needle removed intact.

## 2012-11-17 ENCOUNTER — Encounter (HOSPITAL_COMMUNITY): Payer: Medicare Other | Attending: Oncology

## 2012-11-17 DIAGNOSIS — J984 Other disorders of lung: Secondary | ICD-10-CM | POA: Insufficient documentation

## 2012-11-17 DIAGNOSIS — D751 Secondary polycythemia: Secondary | ICD-10-CM

## 2012-11-17 LAB — CBC
MCH: 29.7 pg (ref 26.0–34.0)
MCV: 87.3 fL (ref 78.0–100.0)
Platelets: 212 10*3/uL (ref 150–400)
RDW: 13.8 % (ref 11.5–15.5)

## 2012-11-17 NOTE — Progress Notes (Signed)
Labs drawn today for cbc 

## 2012-12-20 ENCOUNTER — Other Ambulatory Visit (HOSPITAL_COMMUNITY): Payer: Medicare Other

## 2012-12-20 ENCOUNTER — Encounter (HOSPITAL_COMMUNITY): Payer: Non-veteran care | Attending: Oncology

## 2012-12-20 ENCOUNTER — Ambulatory Visit (HOSPITAL_COMMUNITY): Payer: Medicare Other | Admitting: Oncology

## 2012-12-20 DIAGNOSIS — J984 Other disorders of lung: Secondary | ICD-10-CM | POA: Insufficient documentation

## 2012-12-20 DIAGNOSIS — D751 Secondary polycythemia: Secondary | ICD-10-CM

## 2012-12-20 LAB — CBC
MCHC: 35.2 g/dL (ref 30.0–36.0)
Platelets: 195 10*3/uL (ref 150–400)
RDW: 14.4 % (ref 11.5–15.5)

## 2012-12-20 NOTE — Progress Notes (Signed)
Labs drawn today for cbc 

## 2013-02-20 ENCOUNTER — Ambulatory Visit (HOSPITAL_COMMUNITY)
Admission: RE | Admit: 2013-02-20 | Discharge: 2013-02-20 | Disposition: A | Discharge status: Home / Self Care | Payer: Medicare Other | Source: Ambulatory Visit | Attending: Oncology | Admitting: Oncology

## 2013-02-20 DIAGNOSIS — R599 Enlarged lymph nodes, unspecified: Secondary | ICD-10-CM

## 2013-02-20 DIAGNOSIS — J449 Chronic obstructive pulmonary disease, unspecified: Secondary | ICD-10-CM

## 2013-02-20 DIAGNOSIS — D751 Secondary polycythemia: Secondary | ICD-10-CM

## 2013-02-20 DIAGNOSIS — J9819 Other pulmonary collapse: Secondary | ICD-10-CM | POA: Insufficient documentation

## 2013-02-20 DIAGNOSIS — J984 Other disorders of lung: Secondary | ICD-10-CM | POA: Insufficient documentation

## 2013-02-21 NOTE — Progress Notes (Signed)
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   I personally reviewed and went over laboratory results with the patient. Labs are two months old and we will draw new labs today.   I personally reviewed and went over radiographic studies with the patient.  CT of chest wo contrast was performed on 02/20/2013 which demonstrated a New atelectasis in the medial right lower lobe. Cannot exclude a mass lesion compressing right lower lobe bronchus although none is identified on this noncontrast exam.    He denies any increased SOB or chest pain.  He does note a cough that is productive of clear sputum.  He denies any fevers or chills.  He admits to a post-nasal drip without a sore throat.  With this information, and the results of CT of chest, I will treat with a Z-Pak and repeat CT of chest in 3 months.  He is agreeable to this plan.    He reports that he is going to the [place] for testing related to exposure to agent orange in [place].    He continues to smoke and smoking cessation education was provided.  He also admits to EtOH as he does smell of EtOH this AM.  I have provided him education regarding the role of therapeutic phlebotomy.  He is encouraged to donate blood if he desires.  We will only phlebotomize as symptomatically indicated.   Hematologically, he denies any complaints and ROS questioning is negative.     Past Medical History  Diagnosis Date  .  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 COPD (chronic obstructive pulmonary disease) 09/22/2010  . History of Venereal disease 10/12/2010  . History of Gout 10/12/2010  . 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     is allergic to cephalexin and codeine.  Melvin Hart had no medications administered during this visit.  Past Surgical History  Procedure Laterality Date  . Lymph node biopsy    . Incise and drain abcess      Denies any headaches, dizziness, double vision, fevers, chills, night sweats, nausea, vomiting, diarrhea, constipation, chest pain, heart palpitations, shortness of breath, blood in stool, black tarry stool, urinary pain, urinary burning, urinary frequency, hematuria.   PHYSICAL EXAMINATION  ECOG PERFORMANCE STATUS: 1 - Symptomatic but completely ambulatory  Filed Vitals:   02/22/13 0900  BP: 136/84  Pulse: 97  Temp: 97.9 F (36.6 C)  Resp: 22    GENERAL:alert, no distress, comfortable, cooperative and chronically-ill appearing with thickened facial skin, smelling of tobacco and EtOH SKIN: skin color, texture, turgor are normal, no rashes or significant lesions HEAD: Normocephalic, No masses, lesions, tenderness or abnormalities EYES: normal, PERRLA, EOMI, Conjunctiva are pink and non-injected EARS: External ears normal OROPHARYNX:mucous membranes are moist and posterior pharynx erythema noted.  NECK: supple, no adenopathy, thyroid normal size, non-tender, without nodularity, no stridor, non-tender, trachea midline LYMPH:  no  palpable lymphadenopathy BREAST:not examined LUNGS: decreased breath sounds throughout HEART: regular rate & rhythm, no murmurs, no gallops, S1 normal and S2 normal ABDOMEN:abdomen soft, non-tender, obese and normal bowel sounds BACK: Back symmetric, no curvature. EXTREMITIES:less then 2  second capillary refill, no joint deformities, effusion, or inflammation, no edema, no skin discoloration  NEURO: alert & oriented x 3 with fluent speech, no focal motor/sensory deficits, gait normal    LABORATORY DATA: CBC    Component Value Date/Time   WBC 6.7 12/20/2012 1032   RBC 5.36 12/20/2012 1032   HGB 16.3 12/20/2012 1032   HCT 46.3 12/20/2012 1032   PLT 195 12/20/2012 1032   MCV 86.4 12/20/2012 1032   MCH 30.4 12/20/2012 1032   MCHC 35.2 12/20/2012 1032   RDW 14.4 12/20/2012 1032   LYMPHSABS 1.5 09/03/2011 0940   MONOABS 0.3 09/03/2011 0940   EOSABS 0.2 09/03/2011 0940   BASOSABS 0.1 09/03/2011 0940      RADIOGRAPHIC STUDIES:  Ct Chest Wo Contrast  02/20/2013   CLINICAL DATA:  Followup pulmonary scarring and enlarged subcarinal lymph node  EXAM: CT CHEST WITHOUT CONTRAST  TECHNIQUE: Multidetector CT imaging of the chest was performed following the standard protocol without IV contrast.  COMPARISON:  CT 08/21/2012  FINDINGS: There is consolidation and atelectasis in the medial right lower lobe which is new from comparison exam. There is some narrowing of the right lower lobe bronchus proximal to the atelectatic lung. . No focal mass lesion is identified on this noncontrast exam. Centrilobular emphysema most severe in the right upper lobe. Left lung is clear.  No axillary or supraclavicular lymphadenopathy. No mediastinal hilar lymphadenopathy. No pericardial fluid. Coronary calcifications are present.  Review of the upper abdomen is unremarkable. No aggressive osseous lesion.  IMPRESSION: 1. New atelectasis in the medial right lower lobe. Cannot exclude a mass lesion compressing right lower lobe bronchus although none is identified on this noncontrast exam. Recommend a follow-up CT exam with contrast versus FDG PET-CT scan. 2. No mediastinal lymphadenopathy   Electronically Signed   By: Genevive Bi M.D.   On: 02/20/2013 12:15      ASSESSMENT:  1. Erythrocytosis secondary to  pulmonary disease.  2. COPD  3. Weight loss of 10% of body weight x 2 year. Weighed 188 in December 2012, 179 lbs in December 2013 and 171 lbs today.  4. Cough, productive of clear sputum 5. Post-nasal drip 6. Abnormal CT of chest with new right medial right lower lobe atelectasis. 7. Tobacco abuse 8. EtOH abuse  Patient Active Problem List   Diagnosis Date Noted  . History of Venereal disease 10/12/2010  . History of Gout 10/12/2010  . 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 COPD (chronic obstructive pulmonary disease) 09/22/2010     PLAN:  1. I personally reviewed and went over laboratory results with the patient. 2. I personally reviewed and went over radiographic studies with the patient. 3. Will treat with Z-Pak 4. Smoking cessation education provided today 5. EtOH cessation education provided today 6. Labs today: CBC 7. Labs in 3 months: CBC, CMET 8. CT chest with contrast in 3 months 9. Influenza vaccine today 10. Return in 3 months for follow-up.    THERAPY PLAN:  I will treat the patient with a Z-Pak for a respiratory infection per CT of chest.  I will then repeat CT of chest in 3 months to evaluate for resolution and evaluate for a hidden bronchogenic carcinoma within the atelectasis finding due to  weight loss and heavy smoking which he continues.   All questions were answered. The patient knows to call the clinic with any problems, questions or concerns. We can certainly see the patient much sooner if necessary.  Patient and plan discussed with Dr. Alla German and he is in agreement with the aforementioned.   Zimir Kittleson

## 2013-02-22 ENCOUNTER — Encounter (HOSPITAL_BASED_OUTPATIENT_CLINIC_OR_DEPARTMENT_OTHER): Payer: Non-veteran care

## 2013-02-22 ENCOUNTER — Other Ambulatory Visit (HOSPITAL_COMMUNITY): Payer: Medicare Other

## 2013-02-22 ENCOUNTER — Encounter (HOSPITAL_COMMUNITY): Payer: Non-veteran care | Attending: Oncology | Admitting: Oncology

## 2013-02-22 VITALS — BP 136/84 | HR 97 | Temp 97.9°F | Resp 22 | Wt 171.4 lb

## 2013-02-22 DIAGNOSIS — J984 Other disorders of lung: Secondary | ICD-10-CM

## 2013-02-22 DIAGNOSIS — F1010 Alcohol abuse, uncomplicated: Secondary | ICD-10-CM

## 2013-02-22 DIAGNOSIS — J449 Chronic obstructive pulmonary disease, unspecified: Secondary | ICD-10-CM

## 2013-02-22 DIAGNOSIS — Z23 Encounter for immunization: Secondary | ICD-10-CM

## 2013-02-22 DIAGNOSIS — Z0000 Encounter for general adult medical examination without abnormal findings: Secondary | ICD-10-CM | POA: Insufficient documentation

## 2013-02-22 DIAGNOSIS — J4489 Other specified chronic obstructive pulmonary disease: Secondary | ICD-10-CM | POA: Insufficient documentation

## 2013-02-22 DIAGNOSIS — R9389 Abnormal findings on diagnostic imaging of other specified body structures: Secondary | ICD-10-CM

## 2013-02-22 DIAGNOSIS — J988 Other specified respiratory disorders: Secondary | ICD-10-CM | POA: Insufficient documentation

## 2013-02-22 DIAGNOSIS — F17200 Nicotine dependence, unspecified, uncomplicated: Secondary | ICD-10-CM

## 2013-02-22 DIAGNOSIS — R634 Abnormal weight loss: Secondary | ICD-10-CM

## 2013-02-22 DIAGNOSIS — D751 Secondary polycythemia: Secondary | ICD-10-CM

## 2013-02-22 LAB — CBC
Platelets: 188 10*3/uL (ref 150–400)
RBC: 5.74 MIL/uL (ref 4.22–5.81)
WBC: 7.1 10*3/uL (ref 4.0–10.5)

## 2013-02-22 MED ORDER — AZITHROMYCIN 250 MG PO TABS: ORAL_TABLET | ORAL | Status: DC

## 2013-02-22 MED ORDER — INFLUENZA VAC SPLIT QUAD 0.5 ML IM SUSP
0.5000 mL | Freq: Once | INTRAMUSCULAR | Status: AC
  Administered 2013-02-22: 0.5 mL via INTRAMUSCULAR
  Filled 2013-02-22: qty 0.5

## 2013-02-22 NOTE — Patient Instructions (Signed)
[facility] Discharge Instructions  RECOMMENDATIONS MADE BY THE CONSULTANT AND ANY TEST RESULTS WILL BE SENT TO YOUR REFERRING PHYSICIAN.  EXAM FINDINGS BY THE PHYSICIAN TODAY AND SIGNS OR SYMPTOMS TO REPORT TO CLINIC OR PRIMARY PHYSICIAN: Exam and findings as discussed by Dellis Anes, PA-C.  Will give you your flu shot today and check some blood work.  MEDICATIONS PRESCRIBED:  Zpak - take as directed.  INSTRUCTIONS/FOLLOW-UP: Follow-up in 3 months with labs, CT of chest and office visit.  Thank you for choosing [facility] to provide your oncology and hematology care.  To afford each patient quality time with our providers, please arrive at least 15 minutes before your scheduled appointment time.  With your help, our goal is to use those 15 minutes to complete the necessary work-up to ensure our physicians have the information they need to help with your evaluation and healthcare recommendations.    Effective January 1st, 2014, we ask that you re-schedule your appointment with our physicians should you arrive 10 or more minutes late for your appointment.  We strive to give you quality time with our providers, and arriving late affects you and other patients whose appointments are after yours.    Again, thank you for choosing [facility].  Our hope is that these requests will decrease the amount of time that you wait before being seen by our physicians.       _____________________________________________________________  Should you have questions after your visit to [facility], please contact our office at [phone] between the hours of 8:30 a.m. and 5:00 p.m.  Voicemails left after 4:30 p.m. will not be returned until the following business day.  For prescription refill requests, have your pharmacy contact our office with your prescription refill request.

## 2013-02-22 NOTE — Progress Notes (Signed)
Melvin Hart presents today for injection per MD orders. Flu vaccine administered IM in left Deltoid. Administration without incident. Patient tolerated well.

## 2013-02-22 NOTE — Progress Notes (Signed)
Labs drawn today for cbc 

## 2013-05-15 ENCOUNTER — Telehealth (HOSPITAL_COMMUNITY): Payer: Self-pay | Admitting: Oncology

## 2013-05-22 NOTE — Progress Notes (Signed)
The patient had an office visit at 11:30 AM.  He had a CT of chest that I had completed reviewing with Radiologist, Dr. Thornton Papas and was prepared to enter his exam room at 11:50 AM.  Unfortunately the patient left the clinic without being seen at 11:45 AM.    A letter reviewing his most recent CT scan will be mailed to his address with a request to schedule a follow-up appointment.    CT of chest on 05/23/2013 reports the following: 1. Emphysematous changes with diffuse peribronchial thickening. 2. Persistent atelectasis of right lower lobe with opacification of  right lower lobe bronchus. This could be related to mucous plugging or scarring but endobronchial tumor is not excluded ; recommend bronchoscopy. 3. Questionable 8 x 6 mm nodular focus at the anterior aspect right  major fissure, recommend attention on follow-up studies to exclude  developing pulmonary nodule. 4. Bilateral nonobstructing renal calculi.   I would recommend referral to thoracic surgery for consideration of a bronchoscopy to evaluate for tumor.   Since he left the office, I will compose a letter and send to his address.  Patient and plan discussed with Dr. Farrel Gobble and he is in agreement with the aforementioned.    KEFALAS,THOMAS

## 2013-05-23 ENCOUNTER — Ambulatory Visit (HOSPITAL_COMMUNITY)
Admission: RE | Admit: 2013-05-23 | Discharge: 2013-05-23 | Disposition: A | Discharge status: Home / Self Care | Payer: Medicare HMO | Source: Ambulatory Visit | Attending: Oncology | Admitting: Oncology

## 2013-05-23 ENCOUNTER — Encounter (HOSPITAL_COMMUNITY): Payer: Medicare HMO | Attending: Oncology

## 2013-05-23 ENCOUNTER — Encounter (HOSPITAL_COMMUNITY): Payer: Self-pay | Admitting: Oncology

## 2013-05-23 ENCOUNTER — Encounter (HOSPITAL_COMMUNITY): Payer: Medicare HMO | Admitting: Oncology

## 2013-05-23 VITALS — BP 164/68 | HR 101 | Temp 98.6°F | Resp 21 | Wt 175.6 lb

## 2013-05-23 DIAGNOSIS — D751 Secondary polycythemia: Secondary | ICD-10-CM

## 2013-05-23 DIAGNOSIS — D750 Familial erythrocytosis: Secondary | ICD-10-CM | POA: Insufficient documentation

## 2013-05-23 DIAGNOSIS — R9389 Abnormal findings on diagnostic imaging of other specified body structures: Secondary | ICD-10-CM

## 2013-05-23 DIAGNOSIS — J449 Chronic obstructive pulmonary disease, unspecified: Secondary | ICD-10-CM

## 2013-05-23 DIAGNOSIS — J984 Other disorders of lung: Secondary | ICD-10-CM | POA: Insufficient documentation

## 2013-05-23 DIAGNOSIS — N200 Calculus of kidney: Secondary | ICD-10-CM | POA: Insufficient documentation

## 2013-05-23 DIAGNOSIS — J4489 Other specified chronic obstructive pulmonary disease: Secondary | ICD-10-CM | POA: Insufficient documentation

## 2013-05-23 DIAGNOSIS — I10 Essential (primary) hypertension: Secondary | ICD-10-CM | POA: Insufficient documentation

## 2013-05-23 DIAGNOSIS — R918 Other nonspecific abnormal finding of lung field: Secondary | ICD-10-CM | POA: Insufficient documentation

## 2013-05-23 LAB — COMPREHENSIVE METABOLIC PANEL
ALT: 11 U/L (ref 0–53)
AST: 17 U/L (ref 0–37)
Albumin: 3.6 g/dL (ref 3.5–5.2)
Alkaline Phosphatase: 58 U/L (ref 39–117)
BUN: 13 mg/dL (ref 6–23)
CALCIUM: 9.7 mg/dL (ref 8.4–10.5)
CO2: 34 meq/L — AB (ref 19–32)
CREATININE: 1.03 mg/dL (ref 0.50–1.35)
Chloride: 99 mEq/L (ref 96–112)
GFR, EST AFRICAN AMERICAN: 85 mL/min — AB (ref >90)
GFR, EST NON AFRICAN AMERICAN: 74 mL/min — AB (ref >90)
Glucose, Bld: 97 mg/dL (ref 70–99)
Potassium: 4.5 mEq/L (ref 3.7–5.3)
SODIUM: 141 meq/L (ref 137–147)
TOTAL PROTEIN: 7.2 g/dL (ref 6.0–8.3)
Total Bilirubin: 0.5 mg/dL (ref 0.3–1.2)

## 2013-05-23 LAB — CBC
HCT: 48.9 % (ref 39.0–52.0)
Hemoglobin: 17.3 g/dL — ABNORMAL HIGH (ref 13.0–17.0)
MCH: 32.6 pg (ref 26.0–34.0)
MCHC: 35.4 g/dL (ref 30.0–36.0)
MCV: 92.3 fL (ref 78.0–100.0)
PLATELETS: 140 10*3/uL — AB (ref 150–400)
RBC: 5.3 MIL/uL (ref 4.22–5.81)
RDW: 13.2 % (ref 11.5–15.5)
WBC: 7.6 10*3/uL (ref 4.0–10.5)

## 2013-05-23 MED ORDER — IOHEXOL 300 MG/ML  SOLN
80.0000 mL | Freq: Once | INTRAMUSCULAR | Status: AC | PRN
  Administered 2013-05-23: 80 mL via INTRAVENOUS

## 2013-05-23 NOTE — Progress Notes (Signed)
Labs drawn today for cbc/diff,cmp 

## 2013-05-24 ENCOUNTER — Ambulatory Visit (HOSPITAL_COMMUNITY): Payer: Medicare Other | Admitting: Oncology

## 2013-09-03 ENCOUNTER — Encounter (HOSPITAL_COMMUNITY): Payer: Self-pay | Admitting: Oncology

## 2015-05-14 ENCOUNTER — Encounter (HOSPITAL_COMMUNITY): Payer: Self-pay | Admitting: Emergency Medicine

## 2015-05-14 ENCOUNTER — Emergency Department (HOSPITAL_COMMUNITY): Payer: Medicare HMO

## 2015-05-14 ENCOUNTER — Emergency Department (HOSPITAL_COMMUNITY)
Admission: EM | Admit: 2015-05-14 | Discharge: 2015-05-14 | Disposition: A | Discharge status: Home / Self Care | Payer: Medicare HMO | Attending: Emergency Medicine | Admitting: Emergency Medicine

## 2015-05-14 DIAGNOSIS — M545 Low back pain, unspecified: Secondary | ICD-10-CM

## 2015-05-14 DIAGNOSIS — Z792 Long term (current) use of antibiotics: Secondary | ICD-10-CM | POA: Insufficient documentation

## 2015-05-14 DIAGNOSIS — J449 Chronic obstructive pulmonary disease, unspecified: Secondary | ICD-10-CM | POA: Insufficient documentation

## 2015-05-14 DIAGNOSIS — F17210 Nicotine dependence, cigarettes, uncomplicated: Secondary | ICD-10-CM | POA: Insufficient documentation

## 2015-05-14 DIAGNOSIS — Z79899 Other long term (current) drug therapy: Secondary | ICD-10-CM | POA: Diagnosis not present

## 2015-05-14 DIAGNOSIS — R319 Hematuria, unspecified: Secondary | ICD-10-CM | POA: Diagnosis not present

## 2015-05-14 DIAGNOSIS — F1020 Alcohol dependence, uncomplicated: Secondary | ICD-10-CM | POA: Insufficient documentation

## 2015-05-14 DIAGNOSIS — I10 Essential (primary) hypertension: Secondary | ICD-10-CM | POA: Diagnosis not present

## 2015-05-14 DIAGNOSIS — N200 Calculus of kidney: Secondary | ICD-10-CM | POA: Insufficient documentation

## 2015-05-14 DIAGNOSIS — R109 Unspecified abdominal pain: Secondary | ICD-10-CM | POA: Diagnosis present

## 2015-05-14 LAB — URINALYSIS, ROUTINE W REFLEX MICROSCOPIC
BILIRUBIN URINE: NEGATIVE
GLUCOSE, UA: NEGATIVE mg/dL
KETONES UR: NEGATIVE mg/dL
Leukocytes, UA: NEGATIVE
Nitrite: NEGATIVE
PROTEIN: 100 mg/dL — AB
Specific Gravity, Urine: 1.02 (ref 1.005–1.030)
pH: 6.5 (ref 5.0–8.0)

## 2015-05-14 LAB — URINE MICROSCOPIC-ADD ON: Bacteria, UA: NONE SEEN

## 2015-05-14 MED ORDER — KETOROLAC TROMETHAMINE 60 MG/2ML IM SOLN
30.0000 mg | Freq: Once | INTRAMUSCULAR | Status: AC
  Administered 2015-05-14: 30 mg via INTRAMUSCULAR
  Filled 2015-05-14: qty 2

## 2015-05-14 MED ORDER — OXYCODONE-ACETAMINOPHEN 5-325 MG PO TABS: 1.0000 | ORAL_TABLET | ORAL | Status: DC | PRN

## 2015-05-14 MED ORDER — OXYCODONE-ACETAMINOPHEN 5-325 MG PO TABS
2.0000 | ORAL_TABLET | Freq: Once | ORAL | Status: AC
  Administered 2015-05-14: 2 via ORAL
  Filled 2015-05-14: qty 2

## 2015-05-14 NOTE — ED Notes (Addendum)
Pt states he is here for right flank pain that has been occuring for "years now."  Reports it just worsens from time to time.  Unable to elaborate any further.

## 2015-05-14 NOTE — ED Notes (Signed)
History of kidney stones.  Having severe pain to right flank for last 2 days.  Rates pain 5/10 and increases with movement.

## 2015-05-14 NOTE — Discharge Instructions (Signed)
Your back pain today appears to be musculoskeletal in origin. Use nonsteroidal anti-inflammatory medication such as ibuprofen for pain. Consider discussing physical therapy with your primary care doctor. You do have kidney stones, but none of these are in the ureter where they usually cause pain. You have been given a referral to urology for follow-up.  Back Pain, Adult Back pain is very common. The pain often gets better over time. The cause of back pain is usually not dangerous. Most people can learn to manage their back pain on their own.  HOME CARE  Watch your back pain for any changes. The following actions may help to lessen any pain you are feeling:  Stay active. Start with short walks on flat ground if you can. Try to walk farther each day.  Exercise regularly as told by your doctor. Exercise helps your back heal faster. It also helps avoid future injury by keeping your muscles strong and flexible.  Do not sit, drive, or stand in one place for more than 30 minutes.  Do not stay in bed. Resting more than 1-2 days can slow down your recovery.  Be careful when you bend or lift an object. Use good form when lifting:  Bend at your knees.  Keep the object close to your body.  Do not twist.  Sleep on a firm mattress. Lie on your side, and bend your knees. If you lie on your back, put a pillow under your knees.  Take medicines only as told by your doctor.  Put ice on the injured area.  Put ice in a plastic bag.  Place a towel between your skin and the bag.  Leave the ice on for 20 minutes, 2-3 times a day for the first 2-3 days. After that, you can switch between ice and heat packs.  Avoid feeling anxious or stressed. Find good ways to deal with stress, such as exercise.  Maintain a healthy weight. Extra weight puts stress on your back. GET HELP IF:   You have pain that does not go away with rest or medicine.  You have worsening pain that goes down into your legs or  buttocks.  You have pain that does not get better in one week.  You have pain at night.  You lose weight.  You have a fever or chills. GET HELP RIGHT AWAY IF:   You cannot control when you poop (bowel movement) or pee (urinate).  Your arms or legs feel weak.  Your arms or legs lose feeling (numbness).  You feel sick to your stomach (nauseous) or throw up (vomit).  You have belly (abdominal) pain.  You feel like you may pass out (faint).   This information is not intended to replace advice given to you by your health care provider. Make sure you discuss any questions you have with your health care provider.   Document Released: 08/18/2007 Document Revised: 03/22/2014 Document Reviewed: 07/03/2013 Elsevier Interactive Patient Education [employer].

## 2015-05-14 NOTE — ED Provider Notes (Signed)
CSN: [id number]     Arrival date & time 05/14/15  1224 History   First MD Initiated Contact with Patient 05/14/15 1336     Chief Complaint  Patient presents with  . Flank Pain    right     (Consider location/radiation/quality/duration/timing/severity/associated sxs/prior Treatment) HPI  70 y.o man history of kidney stones complaining of right flank pain for one week c.w.Patient thinks pain is consistent with previous kidney stone.  Pain increases with movement.  No fever, no known hematuria, some increase in frequency.  NO intervention tried at home.  Pain moderate to severs.   Past Medical History  Diagnosis Date  . 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 COPD (chronic obstructive pulmonary disease) ([place]) 09/22/2010  . History of Venereal disease 10/12/2010  . History of Gout 10/12/2010  . Hypertension    Past Surgical History  Procedure Laterality Date  . Lymph node biopsy    . Incise and drain abcess     History reviewed. No pertinent family history. Social History  Substance Use Topics  . Smoking status: Current Every Day Smoker -- 1.00 packs/day    Types: Cigarettes  . Smokeless tobacco: Never Used  . Alcohol Use: Yes     Comment: daily    Review of Systems  All other systems reviewed and are negative.     Allergies  Cephalexin and Codeine  Home Medications   Prior to Admission medications   Medication Sig Start Date End Date Taking? Authorizing Provider  acetaminophen (TYLENOL) 325 MG tablet Take 325 mg by mouth every 6 (six) hours as needed.   Yes Historical Provider, MD  albuterol-ipratropium (COMBIVENT) 18-103 MCG/ACT inhaler Inhale 2 puffs into the lungs every 6 (six) hours as needed. For shortness of breath   Yes Historical Provider, MD  budesonide-formoterol (SYMBICORT) 160-4.5 MCG/ACT inhaler Inhale 2 puffs into the lungs 2 (two) times daily.   Yes Historical Provider, MD  lisinopril (PRINIVIL,ZESTRIL) 10 MG tablet Take 10 mg by mouth daily.    Yes Historical Provider, MD  tiotropium (SPIRIVA) 18 MCG inhalation capsule Place 18 mcg into inhaler and inhale daily.   Yes Historical Provider, MD  aspirin EC 81 MG tablet Take 81 mg by mouth daily. Reported on 05/14/2015    Historical Provider, MD  azithromycin (ZITHROMAX) 250 MG tablet As directed Patient not taking: Reported on 05/14/2015 02/22/13   Baird Cancer, PA-C  diphenhydrAMINE (BENADRYL) 25 MG tablet Take 25 mg by mouth at bedtime as needed. Reported on 05/14/2015    Historical Provider, MD   BP 153/61 mmHg  Pulse 82  Temp(Src) 98.7 F (37.1 C) (Oral)  Resp 16  Ht 5' 8.5" (1.74 m)  Wt 80.287 kg  BMI 26.52 kg/m2 Physical Exam  Constitutional: He is oriented to person, place, and time. He appears well-developed and well-nourished.  HENT:  Head: Normocephalic and atraumatic.  Right Ear: External ear normal.  Left Ear: External ear normal.  Nose: Nose normal.  Mouth/Throat: Oropharynx is clear and moist.  Eyes: Conjunctivae and EOM are normal. Pupils are equal, round, and reactive to light.  Neck: Normal range of motion. Neck supple.  Cardiovascular: Normal rate, regular rhythm, normal heart sounds and intact distal pulses.   Pulmonary/Chest: Effort normal and breath sounds normal. No respiratory distress. He has no wheezes. He exhibits no tenderness.  Abdominal: Soft. Bowel sounds are normal. He exhibits no distension and no mass. There is no tenderness. There is no guarding.  Musculoskeletal: Normal range of motion.  Pain in right low back with movement.   Neurological: He is alert and oriented to person, place, and time. He has normal reflexes. He exhibits normal muscle tone. Coordination normal.  Skin: Skin is warm and dry.  Psychiatric: He has a normal mood and affect. His behavior is normal. Judgment and thought content normal.  Nursing note and vitals reviewed.   ED Course  Procedures (including critical care time) Labs Review Labs Reviewed  URINALYSIS, ROUTINE  W REFLEX MICROSCOPIC (NOT AT [facility]) - Abnormal; Notable for the following:    Hgb urine dipstick TRACE (*)    Protein, ur 100 (*)    All other components within normal limits  URINE MICROSCOPIC-ADD ON - Abnormal; Notable for the following:    Squamous Epithelial / LPF 0-5 (*)    All other components within normal limits    Imaging Review Ct Renal Stone Study  05/14/2015  CLINICAL DATA:  Chronic right flank pain, history of kidney stones status post lithotripsy EXAM: CT ABDOMEN AND PELVIS WITHOUT CONTRAST TECHNIQUE: Multidetector CT imaging of the abdomen and pelvis was performed following the standard protocol without IV contrast. COMPARISON:  Renal ultrasound dated 07/13/2010 FINDINGS: Lower chest:  Small right pleural effusion. Hepatobiliary: Unenhanced liver is unremarkable. Punctate granuloma in the central left liver (series 2/image 21). Gallbladder is unremarkable. No intrahepatic or extrahepatic ductal dilatation. Pancreas: Within normal limits. Spleen: Within normal limits. Adrenals/Urinary Tract: Adrenal glands within normal limits. Three dominant right renal calculi measuring up to 8 mm in the right upper pole (series 2/ image 20). Five dominant left renal calculi measuring up to 10 mm in the left lower pole (series 2/ image 31). Bilateral probable renal cysts, including a dominant 15 mm exophytic lateral left lower pole renal cyst (series 2/ image 31). No ureteral or bladder calculi.  No hydronephrosis. Bladder is within normal limits Stomach/Bowel: Stomach is within normal limits. Distal appendix is mildly prominent/ dilated (series 2/image 44), but without associated inflammatory changes. Colonic diverticulosis, without associated inflammatory changes. Vascular/Lymphatic: Atherosclerotic calcifications of the abdominal aorta and branch vessels. No evidence of abdominal aortic aneurysm. No suspicious abdominopelvic lymphadenopathy. Reproductive: Prostate is unremarkable. Other: No abdominopelvic  ascites. Small fat containing bilateral inguinal hernias. Musculoskeletal: Degenerative changes of the visualized thoracolumbar spine. IMPRESSION: Multiple bilateral nonobstructing renal calculi measuring up to 10 mm in the left lower pole. No ureteral or bladder calculi. No hydronephrosis. No evidence of bowel obstruction or appendicitis. Small right pleural effusion. Electronically Signed   By: Julian Hy M.D.   On: 05/14/2015 14:25   I have personally reviewed and evaluated these images and lab results as part of my medical decision-making.   MDM   Final diagnoses:  Right-sided low back pain without sciatica  Kidney stones  Hematuria        Pattricia Boss, MD 05/14/15 1544

## 2015-05-30 ENCOUNTER — Ambulatory Visit: Payer: Medicare HMO | Admitting: Urology

## 2017-07-25 ENCOUNTER — Emergency Department (HOSPITAL_COMMUNITY): Payer: Medicare HMO

## 2017-07-25 ENCOUNTER — Inpatient Hospital Stay (HOSPITAL_COMMUNITY)
Admission: EM | Admit: 2017-07-25 | Discharge: 2017-08-01 | DRG: 064 | Disposition: A | Discharge status: Skilled Nursing Facility | Payer: Medicare HMO | Attending: Internal Medicine | Admitting: Internal Medicine

## 2017-07-25 ENCOUNTER — Encounter (HOSPITAL_COMMUNITY): Payer: Self-pay | Admitting: Emergency Medicine

## 2017-07-25 ENCOUNTER — Other Ambulatory Visit: Payer: Self-pay

## 2017-07-25 DIAGNOSIS — G9341 Metabolic encephalopathy: Secondary | ICD-10-CM | POA: Diagnosis present

## 2017-07-25 DIAGNOSIS — I63412 Cerebral infarction due to embolism of left middle cerebral artery: Secondary | ICD-10-CM | POA: Diagnosis not present

## 2017-07-25 DIAGNOSIS — D6859 Other primary thrombophilia: Secondary | ICD-10-CM | POA: Diagnosis present

## 2017-07-25 DIAGNOSIS — F0390 Unspecified dementia without behavioral disturbance: Secondary | ICD-10-CM | POA: Diagnosis present

## 2017-07-25 DIAGNOSIS — J42 Unspecified chronic bronchitis: Secondary | ICD-10-CM | POA: Diagnosis not present

## 2017-07-25 DIAGNOSIS — J449 Chronic obstructive pulmonary disease, unspecified: Secondary | ICD-10-CM | POA: Diagnosis present

## 2017-07-25 DIAGNOSIS — D649 Anemia, unspecified: Secondary | ICD-10-CM

## 2017-07-25 DIAGNOSIS — E785 Hyperlipidemia, unspecified: Secondary | ICD-10-CM | POA: Diagnosis not present

## 2017-07-25 DIAGNOSIS — I10 Essential (primary) hypertension: Secondary | ICD-10-CM | POA: Diagnosis not present

## 2017-07-25 DIAGNOSIS — H5347 Heteronymous bilateral field defects: Secondary | ICD-10-CM | POA: Diagnosis present

## 2017-07-25 DIAGNOSIS — Z515 Encounter for palliative care: Secondary | ICD-10-CM | POA: Diagnosis not present

## 2017-07-25 DIAGNOSIS — R29702 NIHSS score 2: Secondary | ICD-10-CM | POA: Diagnosis present

## 2017-07-25 DIAGNOSIS — J90 Pleural effusion, not elsewhere classified: Secondary | ICD-10-CM

## 2017-07-25 DIAGNOSIS — D509 Iron deficiency anemia, unspecified: Secondary | ICD-10-CM | POA: Diagnosis not present

## 2017-07-25 DIAGNOSIS — Z9889 Other specified postprocedural states: Secondary | ICD-10-CM | POA: Diagnosis not present

## 2017-07-25 DIAGNOSIS — I63512 Cerebral infarction due to unspecified occlusion or stenosis of left middle cerebral artery: Principal | ICD-10-CM | POA: Diagnosis present

## 2017-07-25 DIAGNOSIS — Z5329 Procedure and treatment not carried out because of patient's decision for other reasons: Secondary | ICD-10-CM | POA: Diagnosis not present

## 2017-07-25 DIAGNOSIS — R918 Other nonspecific abnormal finding of lung field: Secondary | ICD-10-CM | POA: Diagnosis not present

## 2017-07-25 DIAGNOSIS — Z881 Allergy status to other antibiotic agents status: Secondary | ICD-10-CM

## 2017-07-25 DIAGNOSIS — R531 Weakness: Secondary | ICD-10-CM | POA: Diagnosis not present

## 2017-07-25 DIAGNOSIS — R5381 Other malaise: Secondary | ICD-10-CM | POA: Diagnosis present

## 2017-07-25 DIAGNOSIS — Z66 Do not resuscitate: Secondary | ICD-10-CM | POA: Diagnosis present

## 2017-07-25 DIAGNOSIS — F809 Developmental disorder of speech and language, unspecified: Secondary | ICD-10-CM | POA: Diagnosis present

## 2017-07-25 DIAGNOSIS — I639 Cerebral infarction, unspecified: Secondary | ICD-10-CM | POA: Diagnosis not present

## 2017-07-25 DIAGNOSIS — Z79899 Other long term (current) drug therapy: Secondary | ICD-10-CM

## 2017-07-25 DIAGNOSIS — R0600 Dyspnea, unspecified: Secondary | ICD-10-CM

## 2017-07-25 DIAGNOSIS — D638 Anemia in other chronic diseases classified elsewhere: Secondary | ICD-10-CM | POA: Diagnosis present

## 2017-07-25 DIAGNOSIS — J984 Other disorders of lung: Secondary | ICD-10-CM | POA: Diagnosis present

## 2017-07-25 DIAGNOSIS — R091 Pleurisy: Secondary | ICD-10-CM | POA: Diagnosis not present

## 2017-07-25 DIAGNOSIS — J9809 Other diseases of bronchus, not elsewhere classified: Secondary | ICD-10-CM | POA: Diagnosis present

## 2017-07-25 DIAGNOSIS — J918 Pleural effusion in other conditions classified elsewhere: Secondary | ICD-10-CM | POA: Diagnosis not present

## 2017-07-25 DIAGNOSIS — Z8619 Personal history of other infectious and parasitic diseases: Secondary | ICD-10-CM

## 2017-07-25 DIAGNOSIS — Z751 Person awaiting admission to adequate facility elsewhere: Secondary | ICD-10-CM

## 2017-07-25 DIAGNOSIS — I6523 Occlusion and stenosis of bilateral carotid arteries: Secondary | ICD-10-CM | POA: Diagnosis present

## 2017-07-25 DIAGNOSIS — I6789 Other cerebrovascular disease: Secondary | ICD-10-CM | POA: Diagnosis not present

## 2017-07-25 DIAGNOSIS — Z638 Other specified problems related to primary support group: Secondary | ICD-10-CM

## 2017-07-25 DIAGNOSIS — Z7982 Long term (current) use of aspirin: Secondary | ICD-10-CM

## 2017-07-25 DIAGNOSIS — R9082 White matter disease, unspecified: Secondary | ICD-10-CM | POA: Diagnosis present

## 2017-07-25 DIAGNOSIS — J9811 Atelectasis: Secondary | ICD-10-CM | POA: Diagnosis present

## 2017-07-25 DIAGNOSIS — F17210 Nicotine dependence, cigarettes, uncomplicated: Secondary | ICD-10-CM | POA: Diagnosis present

## 2017-07-25 DIAGNOSIS — I6340 Cerebral infarction due to embolism of unspecified cerebral artery: Secondary | ICD-10-CM | POA: Diagnosis present

## 2017-07-25 DIAGNOSIS — J439 Emphysema, unspecified: Secondary | ICD-10-CM | POA: Diagnosis not present

## 2017-07-25 DIAGNOSIS — R590 Localized enlarged lymph nodes: Secondary | ICD-10-CM | POA: Diagnosis present

## 2017-07-25 DIAGNOSIS — R4182 Altered mental status, unspecified: Secondary | ICD-10-CM | POA: Diagnosis not present

## 2017-07-25 DIAGNOSIS — I313 Pericardial effusion (noninflammatory): Secondary | ICD-10-CM | POA: Diagnosis present

## 2017-07-25 DIAGNOSIS — Z7951 Long term (current) use of inhaled steroids: Secondary | ICD-10-CM

## 2017-07-25 DIAGNOSIS — R9389 Abnormal findings on diagnostic imaging of other specified body structures: Secondary | ICD-10-CM

## 2017-07-25 DIAGNOSIS — H53469 Homonymous bilateral field defects, unspecified side: Secondary | ICD-10-CM | POA: Diagnosis not present

## 2017-07-25 DIAGNOSIS — R269 Unspecified abnormalities of gait and mobility: Secondary | ICD-10-CM | POA: Diagnosis not present

## 2017-07-25 DIAGNOSIS — I361 Nonrheumatic tricuspid (valve) insufficiency: Secondary | ICD-10-CM | POA: Diagnosis not present

## 2017-07-25 DIAGNOSIS — Z862 Personal history of diseases of the blood and blood-forming organs and certain disorders involving the immune mechanism: Secondary | ICD-10-CM

## 2017-07-25 DIAGNOSIS — Z885 Allergy status to narcotic agent status: Secondary | ICD-10-CM

## 2017-07-25 LAB — URINALYSIS, ROUTINE W REFLEX MICROSCOPIC
BACTERIA UA: NONE SEEN
Bilirubin Urine: NEGATIVE
Glucose, UA: NEGATIVE mg/dL
Ketones, ur: NEGATIVE mg/dL
LEUKOCYTES UA: NEGATIVE
Nitrite: NEGATIVE
PROTEIN: 30 mg/dL — AB
Specific Gravity, Urine: 1.013 (ref 1.005–1.030)
pH: 6 (ref 5.0–8.0)

## 2017-07-25 LAB — I-STAT TROPONIN, ED: TROPONIN I, POC: 0 ng/mL (ref 0.00–0.08)

## 2017-07-25 LAB — RAPID URINE DRUG SCREEN, HOSP PERFORMED
Amphetamines: NOT DETECTED
BARBITURATES: NOT DETECTED
Benzodiazepines: NOT DETECTED
COCAINE: NOT DETECTED
Opiates: NOT DETECTED
TETRAHYDROCANNABINOL: NOT DETECTED

## 2017-07-25 LAB — COMPREHENSIVE METABOLIC PANEL
ALK PHOS: 55 U/L (ref 38–126)
ALT: 20 U/L (ref 17–63)
AST: 23 U/L (ref 15–41)
Albumin: 2.6 g/dL — ABNORMAL LOW (ref 3.5–5.0)
Anion gap: 8 (ref 5–15)
BILIRUBIN TOTAL: 0.7 mg/dL (ref 0.3–1.2)
BUN: 14 mg/dL (ref 6–20)
CALCIUM: 8.5 mg/dL — AB (ref 8.9–10.3)
CHLORIDE: 99 mmol/L — AB (ref 101–111)
CO2: 29 mmol/L (ref 22–32)
CREATININE: 0.95 mg/dL (ref 0.61–1.24)
GFR calc Af Amer: >60 mL/min (ref >60)
Glucose, Bld: 167 mg/dL — ABNORMAL HIGH (ref 65–99)
Potassium: 4.2 mmol/L (ref 3.5–5.1)
Sodium: 136 mmol/L (ref 135–145)
TOTAL PROTEIN: 6.8 g/dL (ref 6.5–8.1)

## 2017-07-25 LAB — CBC WITH DIFFERENTIAL/PLATELET
BASOS ABS: 0 10*3/uL (ref 0.0–0.1)
Basophils Relative: 0 %
Eosinophils Absolute: 0.1 10*3/uL (ref 0.0–0.7)
Eosinophils Relative: 1 %
HCT: 29.3 % — ABNORMAL LOW (ref 39.0–52.0)
HEMOGLOBIN: 9.4 g/dL — AB (ref 13.0–17.0)
LYMPHS ABS: 1.5 10*3/uL (ref 0.7–4.0)
LYMPHS PCT: 21 %
MCH: 25.5 pg — ABNORMAL LOW (ref 26.0–34.0)
MCHC: 32.1 g/dL (ref 30.0–36.0)
MCV: 79.6 fL (ref 78.0–100.0)
Monocytes Absolute: 0.4 10*3/uL (ref 0.1–1.0)
Monocytes Relative: 6 %
NEUTROS ABS: 5.1 10*3/uL (ref 1.7–7.7)
Neutrophils Relative %: 72 %
Platelets: 278 10*3/uL (ref 150–400)
RBC: 3.68 MIL/uL — AB (ref 4.22–5.81)
RDW: 14.6 % (ref 11.5–15.5)
WBC: 7.1 10*3/uL (ref 4.0–10.5)

## 2017-07-25 LAB — RETICULOCYTES
RBC.: 3.66 MIL/uL — ABNORMAL LOW (ref 4.22–5.81)
RETIC COUNT ABSOLUTE: 62.2 10*3/uL (ref 19.0–186.0)
Retic Ct Pct: 1.7 % (ref 0.4–3.1)

## 2017-07-25 LAB — FOLATE: Folate: 7.3 ng/mL (ref >5.9)

## 2017-07-25 LAB — IRON AND TIBC
IRON: 17 ug/dL — AB (ref 45–182)
Saturation Ratios: 13 % — ABNORMAL LOW (ref 17.9–39.5)
TIBC: 130 ug/dL — ABNORMAL LOW (ref 250–450)
UIBC: 113 ug/dL

## 2017-07-25 LAB — VITAMIN B12: Vitamin B-12: 244 pg/mL (ref 180–914)

## 2017-07-25 LAB — FERRITIN: FERRITIN: 875 ng/mL — AB (ref 24–336)

## 2017-07-25 LAB — ETHANOL: Alcohol, Ethyl (B): <10 mg/dL (ref <10)

## 2017-07-25 MED ORDER — OXYCODONE-ACETAMINOPHEN 5-325 MG PO TABS
1.0000 | ORAL_TABLET | ORAL | Status: DC | PRN
  Administered 2017-07-25: 1 via ORAL
  Filled 2017-07-25: qty 1

## 2017-07-25 MED ORDER — LISINOPRIL 10 MG PO TABS
10.0000 mg | ORAL_TABLET | Freq: Every day | ORAL | Status: DC
  Administered 2017-07-26 – 2017-08-01 (×7): 10 mg via ORAL
  Filled 2017-07-25 (×2): qty 1
  Filled 2017-07-25: qty 2
  Filled 2017-07-25 (×4): qty 1

## 2017-07-25 MED ORDER — SODIUM CHLORIDE 0.9 % IV SOLN
INTRAVENOUS | Status: DC
  Administered 2017-07-25: 23:00:00 via INTRAVENOUS

## 2017-07-25 MED ORDER — IOPAMIDOL (ISOVUE-300) INJECTION 61%
80.0000 mL | Freq: Once | INTRAVENOUS | Status: AC | PRN
  Administered 2017-07-25: 80 mL via INTRAVENOUS

## 2017-07-25 MED ORDER — IPRATROPIUM-ALBUTEROL 18-103 MCG/ACT IN AERO: 2.0000 | INHALATION_SPRAY | Freq: Four times a day (QID) | RESPIRATORY_TRACT | Status: DC | PRN

## 2017-07-25 MED ORDER — ENOXAPARIN SODIUM 40 MG/0.4ML ~~LOC~~ SOLN
40.0000 mg | SUBCUTANEOUS | Status: DC
  Administered 2017-07-25 – 2017-07-31 (×7): 40 mg via SUBCUTANEOUS
  Filled 2017-07-25 (×7): qty 0.4

## 2017-07-25 MED ORDER — IPRATROPIUM-ALBUTEROL 0.5-2.5 (3) MG/3ML IN SOLN: 3.0000 mL | Freq: Four times a day (QID) | RESPIRATORY_TRACT | Status: DC | PRN

## 2017-07-25 MED ORDER — ASPIRIN 325 MG PO TABS
325.0000 mg | ORAL_TABLET | Freq: Every day | ORAL | Status: DC
  Administered 2017-07-25 – 2017-08-01 (×8): 325 mg via ORAL
  Filled 2017-07-25 (×8): qty 1

## 2017-07-25 MED ORDER — MOMETASONE FURO-FORMOTEROL FUM 200-5 MCG/ACT IN AERO
INHALATION_SPRAY | RESPIRATORY_TRACT | Status: AC
  Filled 2017-07-25: qty 8.8

## 2017-07-25 MED ORDER — ONDANSETRON HCL 4 MG PO TABS: 4.0000 mg | ORAL_TABLET | Freq: Four times a day (QID) | ORAL | Status: DC | PRN

## 2017-07-25 MED ORDER — ONDANSETRON HCL 4 MG/2ML IJ SOLN: 4.0000 mg | Freq: Four times a day (QID) | INTRAMUSCULAR | Status: DC | PRN

## 2017-07-25 MED ORDER — MOMETASONE FURO-FORMOTEROL FUM 200-5 MCG/ACT IN AERO
2.0000 | INHALATION_SPRAY | Freq: Two times a day (BID) | RESPIRATORY_TRACT | Status: DC
Start: 2017-07-25 — End: 2017-08-01
  Administered 2017-07-25 – 2017-08-01 (×14): 2 via RESPIRATORY_TRACT
  Filled 2017-07-25: qty 8.8

## 2017-07-25 NOTE — ED Notes (Signed)
Pt to CT

## 2017-07-25 NOTE — ED Notes (Signed)
Pt favors right side visually

## 2017-07-25 NOTE — ED Triage Notes (Signed)
Pt brought in by ems after brother called 104 for pt being confused.  Unsure how long this has been occurring.  Pt is oriented to person and place on arrival to ED.

## 2017-07-25 NOTE — ED Provider Notes (Addendum)
[facility] EMERGENCY DEPARTMENT Provider Note   CSN: [id number] Arrival date & time: 07/25/17  1308     History   Chief Complaint Chief Complaint  Patient presents with  . Altered Mental Status    HPI Melvin Hart is a 72 y.o. male.  Complaint is "he is confused".  HPI: 72 year old male.  Apparently lives with his brother.  Has a history of COPD and a soft anemia.  Also hypertension.  Brother called 911 stating that he was "confused".  Is oriented to person, place.  He is off on year.  He is off on his birthday.  He is awake and alert.  She is complaint is "dizzy" he cannot further specify  No other symptoms reported.  No history of fall, fever, vomiting, or other symptoms.  Past Medical History:  Diagnosis Date  . COPD (chronic obstructive pulmonary disease) ([place]) 09/22/2010  . 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 History of Gout 10/12/2010  . History of Venereal disease 10/12/2010  . Hypertension     Patient Active Problem List   Diagnosis Date Noted  . History of Venereal disease 10/12/2010  . History of Gout 10/12/2010  . 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 COPD (chronic obstructive pulmonary disease) ([place]) 09/22/2010    Past Surgical History:  Procedure Laterality Date  . INCISE AND DRAIN ABCESS    . LYMPH NODE BIOPSY          Home Medications    Prior to Admission medications   Medication Sig Start Date End Date Taking? Authorizing Provider  acetaminophen (TYLENOL) 325 MG tablet Take 325 mg by mouth every 6 (six) hours as needed.    [provider]  albuterol-ipratropium (COMBIVENT) 18-103 MCG/ACT inhaler Inhale 2 puffs into the lungs every 6 (six) hours as needed. For shortness of breath    [provider]  aspirin EC 81 MG tablet Take 81 mg by mouth daily. Reported on 05/14/2015    [provider]  azithromycin (ZITHROMAX) 250 MG tablet As directed Patient not taking: Reported on 05/14/2015  02/22/13   Baird Cancer, PA-C  budesonide-formoterol [facility]) 160-4.5 MCG/ACT inhaler Inhale 2 puffs into the lungs 2 (two) times daily.    [provider]  diphenhydrAMINE (BENADRYL) 25 MG tablet Take 25 mg by mouth at bedtime as needed. Reported on 05/14/2015    [provider]  lisinopril (PRINIVIL,ZESTRIL) 10 MG tablet Take 10 mg by mouth daily.    [provider]  oxyCODONE-acetaminophen (PERCOCET/ROXICET) 5-325 MG tablet Take 1 tablet by mouth every 4 (four) hours as needed for severe pain. 05/14/15   Pattricia Boss, MD  tiotropium (SPIRIVA) 18 MCG inhalation capsule Place 18 mcg into inhaler and inhale daily.    [provider]    Family History History reviewed. No pertinent family history.  Social History Social History   Tobacco Use  . Smoking status: Current Every Day Smoker    Packs/day: 1.00    Types: Cigarettes  . Smokeless tobacco: Never Used  Substance Use Topics  . Alcohol use: Yes    Comment: daily-quit 1 month ago 07/25/17  . Drug use: No     Allergies   Cephalexin and Codeine   Review of Systems Review of Systems  Unable to perform ROS: Other  Level 5 caveat for confusion.   Physical Exam Updated Vital Signs BP (!) 114/92   Pulse 81   Temp 99.4 F (37.4 C) (Oral)   Resp Marland Kitchen)  27   Ht 5\' 8"  (1.727 m)   Wt 81.6 kg (180 lb)   SpO2 95%   BMI 27.37 kg/m   Physical Exam  Constitutional: He is oriented to person, place, and time. He appears well-developed and well-nourished. No distress.  Awake.  His level of consciousness is normal.  He will repeat some questions.  He will answer some simple questioning.  No sign of trauma to the head  HENT:  Head: Normocephalic.  Eyes: Pupils are equal, round, and reactive to light. Conjunctivae are normal. No scleral icterus.  Has a prefer leftward gaze.  No obvious extraocular movement palsies  Neck: Normal range of motion. Neck supple. No thyromegaly present.    Cardiovascular: Normal rate and regular rhythm. Exam reveals no gallop and no friction rub.  No murmur heard. Pulmonary/Chest: Effort normal and breath sounds normal. No respiratory distress. He has no wheezes. He has no rales.  Abdominal: Soft. Bowel sounds are normal. He exhibits no distension. There is no tenderness. There is no rebound.  Musculoskeletal: Normal range of motion.  Neurological: He is alert and oriented to person, place, and time.  Awake.  Alert.  Answers some very simple questioning.  Referred leftward gaze.  When asked to view objects to the right he turns his head markedly to the right.  No palsies.  No pronator drift.  No leg drift.  No nystagmus  Skin: Skin is warm and dry. No rash noted.  Psychiatric: He has a normal mood and affect. His behavior is normal.     ED Treatments / Results  Labs (all labs ordered are listed, but only abnormal results are displayed) Labs Reviewed  URINALYSIS, ROUTINE W REFLEX MICROSCOPIC - Abnormal; Notable for the following components:      Result Value   Hgb urine dipstick SMALL (*)    Protein, ur 30 (*)    All other components within normal limits  CBC WITH DIFFERENTIAL/PLATELET - Abnormal; Notable for the following components:   RBC 3.68 (*)    Hemoglobin 9.4 (*)    HCT 29.3 (*)    MCH 25.5 (*)    All other components within normal limits  COMPREHENSIVE METABOLIC PANEL - Abnormal; Notable for the following components:   Chloride 99 (*)    Glucose, Bld 167 (*)    Calcium 8.5 (*)    Albumin 2.6 (*)    All other components within normal limits  RETICULOCYTES - Abnormal; Notable for the following components:   RBC. 3.66 (*)    All other components within normal limits  ETHANOL  RAPID URINE DRUG SCREEN, HOSP PERFORMED  VITAMIN B12  FOLATE  IRON AND TIBC  FERRITIN  I-STAT TROPONIN, ED    EKG EKG Interpretation  Date/Time:  Monday Jul 25 2017 13:48:16 EDT Ventricular Rate:  88 PR Interval:    QRS Duration: 92 QT  Interval:  375 QTC Calculation: 454 R Axis:   59 Text Interpretation:  Sinus rhythm Baseline wander in lead(s) V2 Confirmed by Tanna Furry [phone]) on 07/25/2017 1:50:39 PM   Radiology Ct Head Wo Contrast  Result Date: 07/25/2017 CLINICAL DATA:  Mental status change today.  Combative. EXAM: CT HEAD WITHOUT CONTRAST TECHNIQUE: Contiguous axial images were obtained from the base of the skull through the vertex without intravenous contrast. COMPARISON:  None. FINDINGS: Brain: There is a moderate-sized posterior left MCA territory infarct predominantly involving the parietal lobe which is favored to be subacute. There could be minimal petechial hemorrhage, however no malignant hemorrhagic  transformation is present. Minimal rightward midline shift measures 4 mm. Small infarcts involving the left caudate head and right parietal cortex are of indeterminate acuity. No extra-axial fluid collection is seen. Periventricular white matter hypodensities are nonspecific but compatible with mild chronic small vessel ischemic disease. There is moderate cerebral atrophy. Vascular: Calcified atherosclerosis at the skull base. No hyperdense vessel. Skull: No fracture or destructive osseous lesion. Sinuses/Orbits: Minimal bilateral ethmoid air cell mucosal thickening. Trace right mastoid fluid. Unremarkable orbits. Other: None. IMPRESSION: 1. Subacute, moderate-sized posterior left MCA infarct. 2. Small age indeterminate right parietal and left caudate infarcts. 3. Mild chronic small vessel ischemic disease and moderate cerebral atrophy. Electronically Signed   By: Logan Bores M.D.   On: 07/25/2017 15:49   Ct Chest W Contrast  Result Date: 07/25/2017 CLINICAL DATA:  Altered mental status. EXAM: CT CHEST WITH CONTRAST TECHNIQUE: Multidetector CT imaging of the chest was performed during intravenous contrast administration. CONTRAST:  72mL ISOVUE-300 IOPAMIDOL (ISOVUE-300) INJECTION 61% COMPARISON:  CT scan of May 23, 2013.  FINDINGS: Cardiovascular: Atherosclerosis of thoracic aorta is noted without aneurysm or dissection. Coronary calcifications are noted. No pericardial effusion is noted. Mediastinum/Nodes: Thyroid gland and esophagus are unremarkable. 1.9 cm subcarinal adenopathy is noted 1.3 cm right paratracheal lymph node is noted. These are new since prior exam. Lungs/Pleura: No pneumothorax is noted. Left lung is clear. Emphysematous disease is noted in the right upper lobe. There is interval development of probable loculated right pleural effusion. Atelectasis or scarring is noted in the right lung base. Atelectasis of right upper lobe is noted; central endobronchial obstruction cannot be excluded. Upper Abdomen: Bilateral nephrolithiasis is noted. Musculoskeletal: No chest wall abnormality. No acute or significant osseous findings. IMPRESSION: Interval development of loculated right pleural effusion, with atelectasis or scarring noted in right lung base. Atelectasis of right upper lobe is noted. The possibility of central obstructive endobronchial lesion cannot be excluded, and further evaluation with bronchoscopy is recommended. Interval development of right paratracheal and subcarinal adenopathy is noted; etiology is unknown, but metastatic disease cannot be excluded. Coronary artery calcifications are noted suggesting coronary artery disease. Bilateral nephrolithiasis. Aortic Atherosclerosis (ICD10-I70.0) and Emphysema (ICD10-J43.9). Electronically Signed   By: Marijo Conception, M.D.   On: 07/25/2017 15:52   Dg Chest Port 1 View  Result Date: 07/25/2017 CLINICAL DATA:  Confusion and weakness EXAM: PORTABLE CHEST 1 VIEW COMPARISON:  05/23/2013 FINDINGS: Cardiac shadow is within normal limits. Increasing effusion and likely underlying infiltrate is noted on the right. The left lung remains clear. No bony abnormality is noted. IMPRESSION: Increasing opacity within the right hemithorax consistent with infiltrate and  underlying effusion. Electronically Signed   By: Inez Catalina M.D.   On: 07/25/2017 14:09    Procedures Procedures (including critical care time)  Medications Ordered in ED Medications  iopamidol (ISOVUE-300) 61 % injection 80 mL (80 mLs Intravenous Contrast Given 07/25/17 1537)     Initial Impression / Assessment and Plan / ED Course  I have reviewed the triage vital signs and the nursing notes.  Pertinent labs & imaging results that were available during my care of the patient were reviewed by me and considered in my medical decision making (see chart for details).    EKG Interpretation  Date/Time:  Monday Jul 25 2017 13:48:16 EDT Ventricular Rate:  88 PR Interval:    QRS Duration: 92 QT Interval:  375 QTC Calculation: 454 R Axis:   59 Text Interpretation:  Sinus rhythm Baseline wander in lead(s) V2  Confirmed by Tanna Furry [phone]) on 07/25/2017 1:50:39 PM   With confusion, possible right hemianopsia, dizziness concern for posterior circulation CVA.  Chest x-ray shows opacification in the right chest.  Differential would include mass, pneumonia, effusion, or combination.  He is not febrile, hypoxemic, and has no leukocytosis.  His blood count shows WBC normal.  Hemoglobin 9.5 versus 17 from 1 year ago.  He is guaiac negative.  Concern for malignancy and associated anemia.  CT of chest requested.  And pending.  Final Clinical Impressions(s) / ED Diagnoses   Final diagnoses:  Cerebrovascular accident (CVA), unspecified mechanism ([place])  Hemianopsia  Abnormal chest x-ray  Anemia, unspecified type   Multiple abnormalities.  Left MCA CVA, subacute.  Patient not a candidate for lytics for unknown last time known normal and subacute presentation.  This rhythm.  Abnormal chest x-ray with loculated effusion and atelectasis.  Question malignancy as patient has new mediastinal lymphadenopathy as well.  Test with Dr. Darrick Meigs.  Dr. Darrick Meigs as his usual called me back immediately.  We have  discussed the case.  He is planning admission.    ED Discharge Orders    None       Tanna Furry, MD 07/25/17 1642    Tanna Furry, MD 07/25/17 [phone]

## 2017-07-25 NOTE — ED Notes (Signed)
Pt sleeping soundly.

## 2017-07-25 NOTE — ED Notes (Signed)
Pt back from CT. Small skin tear to left inner part of second finger. Have applied an occlusive dressing.

## 2017-07-25 NOTE — H&P (Addendum)
TRH H&P    Patient Demographics:    Melvin Hart, is a 72 y.o. male  MRN: [id number]  DOB - [birth year]  Admit Date - 07/25/2017  Referring MD/NP/PA: Tanna Furry  Outpatient Primary MD for the patient is Henreitta Cea, MD  Patient coming from: Home  Chief complaint-  Confusion   HPI:    Melvin Hart  is a 72 y.o. male, with history of COPD, hypertension was brought to hospital with complaints of confusion.  There is no family at bedside, history obtained from ED notes.  Patient apparently lives with his brother and brother called EMS stating that he was confused.  In the ED CT of the head showed subacute left MCA CVA, not a TPA candidate for unknown last time known normal and subacute presentation. CT of the chest showed interval development of loculated right pleural effusion with atelectasis or scarring in the right lung base.  Also shows development of right paratracheal and subcarinal adenopathy. Patient is a poor historian and unable to provide any significant history.    Review of systems:    Unable to obtain as patient is poor historian and presentation with confusion.   With Past History of the following :    Past Medical History:  Diagnosis Date  . COPD (chronic obstructive pulmonary disease) ([place]) 09/22/2010  . 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 History of Gout 10/12/2010  . History of Venereal disease 10/12/2010  . Hypertension       Past Surgical History:  Procedure Laterality Date  . INCISE AND DRAIN ABCESS    . LYMPH NODE BIOPSY        Social History:      Social History   Tobacco Use  . Smoking status: Current Every Day Smoker    Packs/day: 1.00    Types: Cigarettes  . Smokeless tobacco: Never Used  Substance Use Topics  . Alcohol use: Yes    Comment: daily-quit 1 month ago 07/25/17       Family History :   Unable to obtain family history.   Home Medications:   Prior to Admission medications   Medication Sig Start Date End Date Taking? Authorizing Provider  acetaminophen (TYLENOL) 325 MG tablet Take 325 mg by mouth every 6 (six) hours as needed.    [provider]  albuterol-ipratropium (COMBIVENT) 18-103 MCG/ACT inhaler Inhale 2 puffs into the lungs every 6 (six) hours as needed. For shortness of breath    [provider]  aspirin EC 81 MG tablet Take 81 mg by mouth daily. Reported on 05/14/2015    [provider]  azithromycin (ZITHROMAX) 250 MG tablet As directed Patient not taking: Reported on 05/14/2015 02/22/13   Baird Cancer, PA-C  budesonide-formoterol [facility]) 160-4.5 MCG/ACT inhaler Inhale 2 puffs into the lungs 2 (two) times daily.    [provider]  diphenhydrAMINE (BENADRYL) 25 MG tablet Take 25 mg by mouth at bedtime as needed. Reported on 05/14/2015    [provider]  lisinopril (PRINIVIL,ZESTRIL) 10 MG tablet Take 10 mg  by mouth daily.    [provider]  oxyCODONE-acetaminophen (PERCOCET/ROXICET) 5-325 MG tablet Take 1 tablet by mouth every 4 (four) hours as needed for severe pain. 05/14/15   Pattricia Boss, MD  tiotropium (SPIRIVA) 18 MCG inhalation capsule Place 18 mcg into inhaler and inhale daily.    [provider]     Allergies:     Allergies  Allergen Reactions  . Cephalexin Itching  . Codeine Anxiety     Physical Exam:   Vitals  Blood pressure (!) 139/102, pulse 74, temperature 99.4 F (37.4 C), temperature source Oral, resp. rate (!) 25, height 5\' 8"  (1.727 m), weight 81.6 kg (180 lb), SpO2 97 %.  1.  General: Appears in no acute distress  2. Psychiatric: Alert, awake, oriented x2  3. Neurologic: Alert, awake, leftward gaze.  Moving all extremities.  No other focal deficit noted at this time.    4. Eyes :  anicteric sclerae, moist conjunctivae with no lid lag. PERRLA.  5. ENMT:  Oropharynx clear with moist mucous  membranes and good dentition  6. Neck:  supple, no cervical lymphadenopathy appriciated, No thyromegaly  7. Respiratory : Normal respiratory effort, decreased breath sounds on right lung base.  8. Cardiovascular : RRR, no gallops, rubs or murmurs, no leg edema  9. Gastrointestinal:  Positive bowel sounds, abdomen soft, non-tender to palpation,no hepatosplenomegaly, no rigidity or guarding       10. Skin:  No cyanosis, normal texture and turgor, no rash, lesions or ulcers  11.Musculoskeletal:  Good muscle tone,  joints appear normal , no effusions,  normal range of motion    Data Review:    CBC Recent Labs  Lab 07/25/17 1320  WBC 7.1  HGB 9.4*  HCT 29.3*  PLT 278  MCV 79.6  MCH 25.5*  MCHC 32.1  RDW 14.6  LYMPHSABS 1.5  MONOABS 0.4  EOSABS 0.1  BASOSABS 0.0   ------------------------------------------------------------------------------------------------------------------  Chemistries  Recent Labs  Lab 07/25/17 1320  NA 136  K 4.2  CL 99*  CO2 29  GLUCOSE 167*  BUN 14  CREATININE 0.95  CALCIUM 8.5*  AST 23  ALT 20  ALKPHOS 55  BILITOT 0.7   ------------------------------------------------------------------------------------------------------------------  ------------------------------------------------------------------------------------------------------------------ GFR: Estimated Creatinine Clearance: 70 mL/min (by C-G formula based on SCr of 0.95 mg/dL). Liver Function Tests: Recent Labs  Lab 07/25/17 1320  AST 23  ALT 20  ALKPHOS 55  BILITOT 0.7  PROT 6.8  ALBUMIN 2.6*   Anemia Panel: Recent Labs    07/25/17 1559  RETICCTPCT 1.7    --------------------------------------------------------------------------------------------------------------- Urine analysis:    Component Value Date/Time   COLORURINE YELLOW 07/25/2017 1330   APPEARANCEUR CLEAR 07/25/2017 1330   LABSPEC 1.013 07/25/2017 1330   PHURINE 6.0 07/25/2017 1330    GLUCOSEU NEGATIVE 07/25/2017 1330   HGBUR SMALL (A) 07/25/2017 1330   BILIRUBINUR NEGATIVE 07/25/2017 1330   KETONESUR NEGATIVE 07/25/2017 1330   PROTEINUR 30 (A) 07/25/2017 1330   NITRITE NEGATIVE 07/25/2017 1330   LEUKOCYTESUR NEGATIVE 07/25/2017 1330      Imaging Results:    Ct Head Wo Contrast  Result Date: 07/25/2017 CLINICAL DATA:  Mental status change today.  Combative. EXAM: CT HEAD WITHOUT CONTRAST TECHNIQUE: Contiguous axial images were obtained from the base of the skull through the vertex without intravenous contrast. COMPARISON:  None. FINDINGS: Brain: There is a moderate-sized posterior left MCA territory infarct predominantly involving the parietal lobe which is favored to be subacute. There could be minimal petechial hemorrhage, however  no malignant hemorrhagic transformation is present. Minimal rightward midline shift measures 4 mm. Small infarcts involving the left caudate head and right parietal cortex are of indeterminate acuity. No extra-axial fluid collection is seen. Periventricular white matter hypodensities are nonspecific but compatible with mild chronic small vessel ischemic disease. There is moderate cerebral atrophy. Vascular: Calcified atherosclerosis at the skull base. No hyperdense vessel. Skull: No fracture or destructive osseous lesion. Sinuses/Orbits: Minimal bilateral ethmoid air cell mucosal thickening. Trace right mastoid fluid. Unremarkable orbits. Other: None. IMPRESSION: 1. Subacute, moderate-sized posterior left MCA infarct. 2. Small age indeterminate right parietal and left caudate infarcts. 3. Mild chronic small vessel ischemic disease and moderate cerebral atrophy. Electronically Signed   By: Logan Bores M.D.   On: 07/25/2017 15:49   Ct Chest W Contrast  Result Date: 07/25/2017 CLINICAL DATA:  Altered mental status. EXAM: CT CHEST WITH CONTRAST TECHNIQUE: Multidetector CT imaging of the chest was performed during intravenous contrast administration.  CONTRAST:  43mL ISOVUE-300 IOPAMIDOL (ISOVUE-300) INJECTION 61% COMPARISON:  CT scan of May 23, 2013. FINDINGS: Cardiovascular: Atherosclerosis of thoracic aorta is noted without aneurysm or dissection. Coronary calcifications are noted. No pericardial effusion is noted. Mediastinum/Nodes: Thyroid gland and esophagus are unremarkable. 1.9 cm subcarinal adenopathy is noted 1.3 cm right paratracheal lymph node is noted. These are new since prior exam. Lungs/Pleura: No pneumothorax is noted. Left lung is clear. Emphysematous disease is noted in the right upper lobe. There is interval development of probable loculated right pleural effusion. Atelectasis or scarring is noted in the right lung base. Atelectasis of right upper lobe is noted; central endobronchial obstruction cannot be excluded. Upper Abdomen: Bilateral nephrolithiasis is noted. Musculoskeletal: No chest wall abnormality. No acute or significant osseous findings. IMPRESSION: Interval development of loculated right pleural effusion, with atelectasis or scarring noted in right lung base. Atelectasis of right upper lobe is noted. The possibility of central obstructive endobronchial lesion cannot be excluded, and further evaluation with bronchoscopy is recommended. Interval development of right paratracheal and subcarinal adenopathy is noted; etiology is unknown, but metastatic disease cannot be excluded. Coronary artery calcifications are noted suggesting coronary artery disease. Bilateral nephrolithiasis. Aortic Atherosclerosis (ICD10-I70.0) and Emphysema (ICD10-J43.9). Electronically Signed   By: Marijo Conception, M.D.   On: 07/25/2017 15:52   Dg Chest Port 1 View  Result Date: 07/25/2017 CLINICAL DATA:  Confusion and weakness EXAM: PORTABLE CHEST 1 VIEW COMPARISON:  05/23/2013 FINDINGS: Cardiac shadow is within normal limits. Increasing effusion and likely underlying infiltrate is noted on the right. The left lung remains clear. No bony abnormality is  noted. IMPRESSION: Increasing opacity within the right hemithorax consistent with infiltrate and underlying effusion. Electronically Signed   By: Inez Catalina M.D.   On: 07/25/2017 14:09    My personal review of EKG: Rhythm NSR   Assessment & Plan:    Active Problems:   COPD (chronic obstructive pulmonary disease) ([facility])   Lung mass   Pleural effusion   1. Lung mass/pleural effusion-CT chest shows development of loculated right pleural effusion with atelectasis possibility of central obstructive lung endobronchial lesion.  Will consult IR for thoracentesis in a.m.  Obtain labs including glucose, protein, cytology, LDH.  Will consult pulmonology in a.m. 2. Subacute CVA-CT head showed subacute moderate size left MCA infarct.  Will obtain MRI/MRA brain, carotid Dopplers, echocardiogram.  Monitor patient telemetry.  Will change aspirin from 81 mg to 325 mg p.o. Daily 3. COPD-stable, no exacerbation, continue duo nebs every 6 hours 4. Hypertension-continue lisinopril 5.  Anemia-unclear etiology, hemoglobin is 9.4, previously globin was 17.3 in 2015, anemia panel has been obtained.  Stool guaiac is negative.  Follow the results.   DVT Prophylaxis-   Lovenox   AM Labs Ordered, also please review Full Orders  Family Communication: No family at bedside  Code Status:  Presumed full code, unable to contact family  Admission status: Inpatient  Time spent in minutes : 60 min   Oswald Hillock M.D on 07/25/2017 at 5:29 PM  Between 7am to 7pm - Pager - [phone]. After 7pm go to [email or web address] - password [email or web address]  [facility] - Office  [phone]

## 2017-07-26 ENCOUNTER — Inpatient Hospital Stay (HOSPITAL_COMMUNITY): Payer: Medicare HMO

## 2017-07-26 DIAGNOSIS — R9389 Abnormal findings on diagnostic imaging of other specified body structures: Secondary | ICD-10-CM

## 2017-07-26 DIAGNOSIS — I361 Nonrheumatic tricuspid (valve) insufficiency: Secondary | ICD-10-CM

## 2017-07-26 LAB — CBC
HEMATOCRIT: 28.1 % — AB (ref 39.0–52.0)
HEMOGLOBIN: 9.1 g/dL — AB (ref 13.0–17.0)
MCH: 25.8 pg — ABNORMAL LOW (ref 26.0–34.0)
MCHC: 32.4 g/dL (ref 30.0–36.0)
MCV: 79.6 fL (ref 78.0–100.0)
Platelets: 281 10*3/uL (ref 150–400)
RBC: 3.53 MIL/uL — ABNORMAL LOW (ref 4.22–5.81)
RDW: 14.8 % (ref 11.5–15.5)
WBC: 6.9 10*3/uL (ref 4.0–10.5)

## 2017-07-26 LAB — COMPREHENSIVE METABOLIC PANEL
ALBUMIN: 2.5 g/dL — AB (ref 3.5–5.0)
ALK PHOS: 52 U/L (ref 38–126)
ALT: 19 U/L (ref 17–63)
ANION GAP: 8 (ref 5–15)
AST: 17 U/L (ref 15–41)
BUN: 10 mg/dL (ref 6–20)
CO2: 29 mmol/L (ref 22–32)
Calcium: 8.4 mg/dL — ABNORMAL LOW (ref 8.9–10.3)
Chloride: 99 mmol/L — ABNORMAL LOW (ref 101–111)
Creatinine, Ser: 0.8 mg/dL (ref 0.61–1.24)
GFR calc Af Amer: >60 mL/min (ref >60)
GFR calc non Af Amer: >60 mL/min (ref >60)
GLUCOSE: 89 mg/dL (ref 65–99)
POTASSIUM: 4 mmol/L (ref 3.5–5.1)
SODIUM: 136 mmol/L (ref 135–145)
Total Bilirubin: 0.6 mg/dL (ref 0.3–1.2)
Total Protein: 6.6 g/dL (ref 6.5–8.1)

## 2017-07-26 LAB — BODY FLUID CELL COUNT WITH DIFFERENTIAL
EOS FL: 0 %
LYMPHS FL: 27 %
Monocyte-Macrophage-Serous Fluid: 1 % — ABNORMAL LOW (ref 50–90)
NEUTROPHIL FLUID: 72 % — AB (ref 0–25)
WBC FLUID: UNDETERMINED uL (ref 0–1000)

## 2017-07-26 LAB — ECHOCARDIOGRAM COMPLETE
HEIGHTINCHES: 68 in
WEIGHTICAEL: 2880 [oz_av]

## 2017-07-26 LAB — GRAM STAIN

## 2017-07-26 LAB — LIPID PANEL
Cholesterol: 114 mg/dL (ref 0–200)
HDL: 25 mg/dL — ABNORMAL LOW (ref >40)
LDL CALC: 75 mg/dL (ref 0–99)
TRIGLYCERIDES: 71 mg/dL (ref <150)
Total CHOL/HDL Ratio: 4.6 RATIO
VLDL: 14 mg/dL (ref 0–40)

## 2017-07-26 MED ORDER — ORAL CARE MOUTH RINSE
15.0000 mL | Freq: Two times a day (BID) | OROMUCOSAL | Status: DC
  Administered 2017-07-27 – 2017-07-31 (×10): 15 mL via OROMUCOSAL

## 2017-07-26 MED ORDER — CYANOCOBALAMIN 1000 MCG/ML IJ SOLN
1000.0000 ug | Freq: Once | INTRAMUSCULAR | Status: AC
  Administered 2017-07-26: 1000 ug via INTRAMUSCULAR
  Filled 2017-07-26: qty 1

## 2017-07-26 MED ORDER — THIAMINE HCL 100 MG/ML IJ SOLN
100.0000 mg | Freq: Every day | INTRAMUSCULAR | Status: DC
  Administered 2017-07-26 – 2017-08-01 (×7): 100 mg via INTRAVENOUS
  Filled 2017-07-26 (×7): qty 2

## 2017-07-26 MED ORDER — ATORVASTATIN CALCIUM 40 MG PO TABS
40.0000 mg | ORAL_TABLET | Freq: Every day | ORAL | Status: DC
  Administered 2017-07-26 – 2017-07-31 (×4): 40 mg via ORAL
  Filled 2017-07-26 (×4): qty 1

## 2017-07-26 NOTE — Procedures (Signed)
PreOperative Dx: RT pleural effusion Postoperative Dx: Multiloculated complex RT pleural effusion Procedure:   US guided RT thoracentesis Radiologist:  Thornton Papas Anesthesia:  10 ml of 1% lidocaine Specimen:  23 mL of slightly cloudy yellow colored fluid EBL:   < 1 ml Complications: None

## 2017-07-26 NOTE — Evaluation (Signed)
Clinical/Bedside Swallow Evaluation Patient Details  Name: Melvin Hart MRN: [id number] Date of Birth: Dec 19, 1945  Today's Date: 07/26/2017 Time: SLP Start Time (ACUTE ONLY): 36 SLP Stop Time (ACUTE ONLY): 1926 SLP Time Calculation (min) (ACUTE ONLY): 26 min  Past Medical History:  Past Medical History:  Diagnosis Date  . COPD (chronic obstructive pulmonary disease) ([place]) 09/22/2010  . 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 History of Gout 10/12/2010  . History of Venereal disease 10/12/2010  . Hypertension    Past Surgical History:  Past Surgical History:  Procedure Laterality Date  . INCISE AND DRAIN ABCESS    . LYMPH NODE BIOPSY     HPI:  RogerGauldinis a37 y.o.male,with history of COPD, hypertension was brought to hospital with complaints of confusion. There is no family at bedside, history obtained from ED notes. Patient apparentlylives with his brother and brother called EMS stating that he was confused. 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 There is single lesion involving the left frontal region. There is also a right frontal small infarct. 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 SWI sequences are markedly degraded by motion artifact. FLAIR imaging shows mild periventricular white matter disease. Chest xray showed right pleural effusion with atelectasis throughout right lung, mild left base atelectasis. Pt had right thoracentesis early this afternoon. Pt passed RN swallow screen, however MD ordered BSE.    Assessment / Plan / Recommendation Clinical Impression  Pt presents with mild oropharyngeal dysphagia in setting of edentulous status, however Pt appears to masticate solids with only minimal delay. Initially, Pt had no overt signs or symptoms of reduced airway protection, however Pt with delayed prolonged congested coughing  several minutes following po. RN reports that Pt did well taking pills whole with water earlier and with dinner meal (mech soft). Recommend continuing diet as ordered and SLP to follow during acute stay. Pt will need formal SLE due to cognitive linguistic deficits noted this date. If MD suspects aspiration PNA, it may be prudent to complete MBSS. SLP will check tomorrow.   SLP Visit Diagnosis: Dysphagia, unspecified (R13.10)    Aspiration Risk  Mild aspiration risk    Diet Recommendation Dysphagia 3 (Mech soft);Thin liquid   Liquid Administration via: Cup;Straw Medication Administration: Whole meds with liquid Supervision: Staff to assist with self feeding;Full supervision/cueing for compensatory strategies Compensations: Slow rate;Small sips/bites Postural Changes: Seated upright at 90 degrees;Remain upright for at least 30 minutes after po intake    Other  Recommendations Oral Care Recommendations: Oral care BID;Staff/trained caregiver to provide oral care Other Recommendations: Clarify dietary restrictions   Follow up Recommendations Skilled Nursing facility      Frequency and Duration min 2x/week  1 week       Prognosis Prognosis for Safe Diet Advancement: Fair Barriers to Reach Goals: Cognitive deficits      Swallow Study   General Date of Onset: 07/25/17 HPI: RogerGauldinis a78 y.o.male,with history of COPD, hypertension was brought to hospital with complaints of confusion. There is no family at bedside, history obtained from ED notes. Patient apparentlylives with his brother and brother called EMS stating that he was confused. 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 There is single lesion involving the left frontal region. There is also a right frontal small infarct. MRA shows occluded M1 segment on the left  and also heavily diseased A1 segment on the left. SWI sequences are markedly degraded  by motion artifact. FLAIR imaging shows mild periventricular white matter disease. Chest xray showed right pleural effusion with atelectasis throughout right lung, mild left base atelectasis. Pt had right thoracentesis early this afternoon. Pt passed RN swallow screen, however MD ordered BSE.  Type of Study: Bedside Swallow Evaluation Previous Swallow Assessment: None on record Diet Prior to this Study: Dysphagia 3 (soft);Thin liquids Temperature Spikes Noted: No Respiratory Status: Nasal cannula History of Recent Intubation: No Behavior/Cognition: Alert;Cooperative;Requires cueing Oral Cavity Assessment: Within Functional Limits Oral Care Completed by SLP: No Oral Cavity - Dentition: Edentulous Vision: Impaired for self-feeding Self-Feeding Abilities: Needs assist;Needs set up Patient Positioning: Upright in bed Baseline Vocal Quality: Normal Volitional Cough: Strong;Congested Volitional Swallow: Able to elicit    Oral/Motor/Sensory Function Overall Oral Motor/Sensory Function: Within functional limits   Ice Chips Ice chips: Within functional limits Presentation: Spoon   Thin Liquid Thin Liquid: Within functional limits Presentation: Cup;Self Fed;Straw    Nectar Thick Nectar Thick Liquid: Not tested   Honey Thick Honey Thick Liquid: Not tested   Puree Puree: Within functional limits Presentation: Spoon Other Comments: (feeder assist)   Solid   Thank you,  Melvin Hart, CCC-SLP [phone]    Solid: Impaired Presentation: Self Fed Pharyngeal Phase Impairments: Cough - Delayed        Melvin Hart 07/26/2017,8:16 PM

## 2017-07-26 NOTE — Progress Notes (Signed)
*  PRELIMINARY RESULTS* Echocardiogram 2D Echocardiogram has been performed.  Melvin Hart 07/26/2017, 3:26 PM

## 2017-07-26 NOTE — Consult Note (Signed)
Consult requested by: [facility] Consult requested for: Abnormal chest CT  HPI: This is a 72 year old who came to the emergency department because of altered mental status.  Apparently his brother noted that he was not his normal self and called for help and he was brought to the emergency department.  In the emergency department he was found to have an acute CVA.  He was not a candidate for TPA because it was not clear how long he had been altered.  He had chest x-ray as part of his work-up in the emergency department and that showed what looks like a loculated pleural effusion and an infiltrate.  He had CT of the chest which again shows a loculated pleural effusion and atelectasis of the upper lobe.  He has COPD seen on his CT and has a significant smoking history so he is at risk of lung cancer.  He is not able to provide any meaningful history now.  Patient is seen in the x-ray department where he is having multiple studies done as part of his work-up for stroke.  Past Medical History:  Diagnosis Date  . COPD (chronic obstructive pulmonary disease) ([place]) 09/22/2010  . 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 History of Gout 10/12/2010  . History of Venereal disease 10/12/2010  . Hypertension      History reviewed. No pertinent family history.  Unable to provide  Social History   Socioeconomic History  . Marital status: Divorced    Spouse name: Not on file  . Number of children: Not on file  . Years of education: Not on file  . Highest education level: Not on file  Occupational History  . Not on file  Social Needs  . Financial resource strain: Not on file  . Food insecurity:    Worry: Not on file    Inability: Not on file  . Transportation needs:    Medical: Not on file    Non-medical: Not on file  Tobacco Use  . Smoking status: Current Every Day Smoker    Packs/day: 1.00    Types: Cigarettes  . Smokeless tobacco: Never Used  Substance and Sexual Activity   . Alcohol use: Not Currently    Comment: daily-quit 1 month ago 07/25/17  . Drug use: No  . Sexual activity: Not Currently  Lifestyle  . Physical activity:    Days per week: Not on file    Minutes per session: Not on file  . Stress: Not on file  Relationships  . Social connections:    Talks on phone: Not on file    Gets together: Not on file    Attends religious service: Not on file    Active member of club or organization: Not on file    Attends meetings of clubs or organizations: Not on file    Relationship status: Not on file  Other Topics Concern  . Not on file  Social History Narrative  . Not on file     ROS: Not obtainable    Objective: Vital signs in last 24 hours: Temp:  [98.5 F (36.9 C)-99.4 F (37.4 C)] 98.5 F (36.9 C) (05/14 0553) Pulse Rate:  [62-84] 62 (05/14 0553) Resp:  [15-27] 20 (05/14 0553) BP: (105-139)/(52-102) 131/69 (05/14 0553) SpO2:  [95 %-99 %] 98 % (05/14 0553) Weight:  [81.6 kg (180 lb)] 81.6 kg (180 lb) (05/13 1310) Weight change:     Intake/Output from previous day: 05/13 0701 - 05/14 0700 In: 294.5 [P.O.:240;  I.V.:54.5] Out: 900 [Urine:900]  PHYSICAL EXAM Constitutional: He is awake.  Eyes: Pupils react.  Ears nose mouth and throat: His throat is clear.  His hearing is grossly normal.  Cardiovascular: His heart is regular with no gallop.  Respiratory: He has bilateral rhonchi.  Gastrointestinal: Abdomen is soft bowel sounds are present.  Skin: Good turgor.  Musculoskeletal: He is moving all 4 extremities and strength is grossly normal.  Neurological: Difficult to assess.  Psychiatric: Difficult to assess  Lab Results: Basic Metabolic Panel: Recent Labs    07/25/17 1320 07/26/17 0438  NA 136 136  K 4.2 4.0  CL 99* 99*  CO2 29 29  GLUCOSE 167* 89  BUN 14 10  CREATININE 0.95 0.80  CALCIUM 8.5* 8.4*   Liver Function Tests: Recent Labs    07/25/17 1320 07/26/17 0438  AST 23 17  ALT 20 19  ALKPHOS 55 52  BILITOT 0.7  0.6  PROT 6.8 6.6  ALBUMIN 2.6* 2.5*   No results for input(s): LIPASE, AMYLASE in the last 72 hours. No results for input(s): AMMONIA in the last 72 hours. CBC: Recent Labs    07/25/17 1320 07/26/17 0438  WBC 7.1 6.9  NEUTROABS 5.1  --   HGB 9.4* 9.1*  HCT 29.3* 28.1*  MCV 79.6 79.6  PLT 278 281   Cardiac Enzymes: No results for input(s): CKTOTAL, CKMB, CKMBINDEX, TROPONINI in the last 72 hours. BNP: No results for input(s): PROBNP in the last 72 hours. D-Dimer: No results for input(s): DDIMER in the last 72 hours. CBG: No results for input(s): GLUCAP in the last 72 hours. Hemoglobin A1C: No results for input(s): HGBA1C in the last 72 hours. Fasting Lipid Panel: Recent Labs    07/26/17 0438  CHOL 114  HDL 25*  LDLCALC 75  TRIG 71  CHOLHDL 4.6   Thyroid Function Tests: No results for input(s): TSH, T4TOTAL, FREET4, T3FREE, THYROIDAB in the last 72 hours. Anemia Panel: Recent Labs    07/25/17 1559 07/25/17 1603  VITAMINB12 244  --   FOLATE  --  7.3  FERRITIN 875*  --   TIBC 130*  --   IRON 17*  --   RETICCTPCT 1.7  --    Coagulation: No results for input(s): LABPROT, INR in the last 72 hours. Urine Drug Screen: Drugs of Abuse     Component Value Date/Time   LABOPIA NONE DETECTED 07/25/2017 1340   COCAINSCRNUR NONE DETECTED 07/25/2017 1340   LABBENZ NONE DETECTED 07/25/2017 1340   AMPHETMU NONE DETECTED 07/25/2017 1340   THCU NONE DETECTED 07/25/2017 1340   LABBARB NONE DETECTED 07/25/2017 1340    Alcohol Level: Recent Labs    07/25/17 1320  ETH <10   Urinalysis: Recent Labs    07/25/17 1330  COLORURINE YELLOW  LABSPEC 1.013  PHURINE 6.0  GLUCOSEU NEGATIVE  HGBUR SMALL*  BILIRUBINUR NEGATIVE  KETONESUR NEGATIVE  PROTEINUR 30*  NITRITE NEGATIVE  LEUKOCYTESUR NEGATIVE   Misc. Labs:   ABGS: No results for input(s): PHART, PO2ART, TCO2, HCO3 in the last 72 hours.  Invalid input(s): PCO2   MICROBIOLOGY: No results found for this  or any previous visit (from the past 240 hour(s)).  Studies/Results: Ct Head Wo Contrast  Result Date: 07/25/2017 CLINICAL DATA:  Mental status change today.  Combative. EXAM: CT HEAD WITHOUT CONTRAST TECHNIQUE: Contiguous axial images were obtained from the base of the skull through the vertex without intravenous contrast. COMPARISON:  None. FINDINGS: Brain: There is a moderate-sized posterior left MCA  territory infarct predominantly involving the parietal lobe which is favored to be subacute. There could be minimal petechial hemorrhage, however no malignant hemorrhagic transformation is present. Minimal rightward midline shift measures 4 mm. Small infarcts involving the left caudate head and right parietal cortex are of indeterminate acuity. No extra-axial fluid collection is seen. Periventricular white matter hypodensities are nonspecific but compatible with mild chronic small vessel ischemic disease. There is moderate cerebral atrophy. Vascular: Calcified atherosclerosis at the skull base. No hyperdense vessel. Skull: No fracture or destructive osseous lesion. Sinuses/Orbits: Minimal bilateral ethmoid air cell mucosal thickening. Trace right mastoid fluid. Unremarkable orbits. Other: None. IMPRESSION: 1. Subacute, moderate-sized posterior left MCA infarct. 2. Small age indeterminate right parietal and left caudate infarcts. 3. Mild chronic small vessel ischemic disease and moderate cerebral atrophy. Electronically Signed   By: Logan Bores M.D.   On: 07/25/2017 15:49   Ct Chest W Contrast  Result Date: 07/25/2017 CLINICAL DATA:  Altered mental status. EXAM: CT CHEST WITH CONTRAST TECHNIQUE: Multidetector CT imaging of the chest was performed during intravenous contrast administration. CONTRAST:  58mL ISOVUE-300 IOPAMIDOL (ISOVUE-300) INJECTION 61% COMPARISON:  CT scan of May 23, 2013. FINDINGS: Cardiovascular: Atherosclerosis of thoracic aorta is noted without aneurysm or dissection. Coronary  calcifications are noted. No pericardial effusion is noted. Mediastinum/Nodes: Thyroid gland and esophagus are unremarkable. 1.9 cm subcarinal adenopathy is noted 1.3 cm right paratracheal lymph node is noted. These are new since prior exam. Lungs/Pleura: No pneumothorax is noted. Left lung is clear. Emphysematous disease is noted in the right upper lobe. There is interval development of probable loculated right pleural effusion. Atelectasis or scarring is noted in the right lung base. Atelectasis of right upper lobe is noted; central endobronchial obstruction cannot be excluded. Upper Abdomen: Bilateral nephrolithiasis is noted. Musculoskeletal: No chest wall abnormality. No acute or significant osseous findings. IMPRESSION: Interval development of loculated right pleural effusion, with atelectasis or scarring noted in right lung base. Atelectasis of right upper lobe is noted. The possibility of central obstructive endobronchial lesion cannot be excluded, and further evaluation with bronchoscopy is recommended. Interval development of right paratracheal and subcarinal adenopathy is noted; etiology is unknown, but metastatic disease cannot be excluded. Coronary artery calcifications are noted suggesting coronary artery disease. Bilateral nephrolithiasis. Aortic Atherosclerosis (ICD10-I70.0) and Emphysema (ICD10-J43.9). Electronically Signed   By: Marijo Conception, M.D.   On: 07/25/2017 15:52   Mr Jodene Nam Head Wo Contrast  Result Date: 07/26/2017 CLINICAL DATA:  72 year old male with altered mental status for 2 days. Subacute appearing posterior left MCA and other scattered age indeterminate infarcts on head CT yesterday. EXAM: MRI HEAD WITHOUT CONTRAST MRA HEAD WITHOUT CONTRAST TECHNIQUE: Multiplanar, multiecho pulse sequences of the brain and surrounding structures were obtained without intravenous contrast. Angiographic images of the head were obtained using MRA technique without contrast. COMPARISON:  Head CT  07/25/2017.  Cervical spine MRI 08/17/2011. FINDINGS: MRI HEAD FINDINGS Study is intermittently degraded by motion artifact despite repeated imaging attempts. Brain: Confluent restricted diffusion in the left parietal lobe affecting cortex and white matter in an area of 5 centimeters (series 6, image 41), corresponding to the most confluent area of hypodensity in the posterior left hemisphere on CT yesterday. Ischemia tracks into the left occipital lobe corresponding to the left MCA/PCA watershed territory. Multifocal scattered additional cortical and subcortical white matter restricted diffusion elsewhere in the left MCA territory, including some of the anterior division (series 4, image 98). T2 and FLAIR hyperintensity in keeping with cytotoxic edema in  the affected areas. No associated hemorrhage is evident. There is mild regional mass effect, including on the atrium of the left lateral ventricle. There is a solitary linear area of restricted diffusion in the contralateral right hemisphere affecting white matter in the inferior right parietal lobe seen on series 4, image 90. No associated hemorrhage or mass effect. No posterior fossa or pure posterior circulation restricted diffusion. Chronic lacunar infarcts in the left basal ganglia and left thalamus. The brainstem and cerebellum appear relatively spared. There is a small area of chronic cortical encephalomalacia in the right parietal lobe (series 9, image 33, which corresponds to the age indeterminate area seen by CT (#2 yesterday). No midline shift, evidence of mass lesion, ventriculomegaly, extra-axial collection or acute intracranial hemorrhage. Cervicomedullary junction and pituitary are within normal limits. Vascular: Major intracranial vascular flow voids are preserved. Skull and upper cervical spine: Negative visible cervical spine. Sinuses/Orbits: Grossly normal orbits soft tissues. Mild paranasal sinus mucosal thickening. Other: Trace right mastoid  fluid. Grossly negative visible internal auditory structures. Negative scalp and face soft tissues. MRA HEAD FINDINGS Intermittently degraded by motion. Antegrade flow in the posterior circulation including codominant distal vertebral arteries. No distal vertebral stenosis. Patent PICA origins. Patent vertebrobasilar junction. No basilar stenosis. Tortuous basilar artery. Patent SCA and PCA origins. Symmetric bilateral PCA flow signal. Posterior communicating arteries are diminutive or absent. Antegrade flow in both ICA siphons. Bilateral siphon irregularity, no high-grade siphon stenosis suspected. Ophthalmic artery origins appear normal. Patent carotid termini. The right ACA A1 segment appears dominant and the left diminutive. The anterior communicating artery and visible ACA branches appear within normal limits. Both MCA M1 segments are patent. Motion artifact obscures detail of the bilateral MCA bifurcations. There does appear to be asymmetrically decreased flow signal in the posterior left MCA territory. IMPRESSION: 1. Acute infarcts in the Left MCA territory, most pronounced in the posterior division which corresponds to the CT abnormality yesterday. Cytotoxic edema with minimal mass effect. No associated hemorrhage. 2. Superimposed small acute white matter infarct in the posterior right MCA territory. Given the absence of other vascular territory infarcts this may constellation reflect synchronous small vessel disease rather than a recent embolic event from the heart or proximal aorta. 3. Motion degraded MRA is negative for large vessel occlusion. However, stenosis or occlusion is suspected in at least some posterior left MCA branches. 4. Chronic small and medium-sized vessel ischemia in the left deep gray matter nuclei and right parietal lobe. Electronically Signed   By: Genevie Ann M.D.   On: 07/26/2017 08:25   Mr Brain Wo Contrast  Result Date: 07/26/2017 CLINICAL DATA:  72 year old male with altered  mental status for 2 days. Subacute appearing posterior left MCA and other scattered age indeterminate infarcts on head CT yesterday. EXAM: MRI HEAD WITHOUT CONTRAST MRA HEAD WITHOUT CONTRAST TECHNIQUE: Multiplanar, multiecho pulse sequences of the brain and surrounding structures were obtained without intravenous contrast. Angiographic images of the head were obtained using MRA technique without contrast. COMPARISON:  Head CT 07/25/2017.  Cervical spine MRI 08/17/2011. FINDINGS: MRI HEAD FINDINGS Study is intermittently degraded by motion artifact despite repeated imaging attempts. Brain: Confluent restricted diffusion in the left parietal lobe affecting cortex and white matter in an area of 5 centimeters (series 6, image 41), corresponding to the most confluent area of hypodensity in the posterior left hemisphere on CT yesterday. Ischemia tracks into the left occipital lobe corresponding to the left MCA/PCA watershed territory. Multifocal scattered additional cortical and subcortical white matter restricted  diffusion elsewhere in the left MCA territory, including some of the anterior division (series 4, image 98). T2 and FLAIR hyperintensity in keeping with cytotoxic edema in the affected areas. No associated hemorrhage is evident. There is mild regional mass effect, including on the atrium of the left lateral ventricle. There is a solitary linear area of restricted diffusion in the contralateral right hemisphere affecting white matter in the inferior right parietal lobe seen on series 4, image 90. No associated hemorrhage or mass effect. No posterior fossa or pure posterior circulation restricted diffusion. Chronic lacunar infarcts in the left basal ganglia and left thalamus. The brainstem and cerebellum appear relatively spared. There is a small area of chronic cortical encephalomalacia in the right parietal lobe (series 9, image 33, which corresponds to the age indeterminate area seen by CT (#2 yesterday). No  midline shift, evidence of mass lesion, ventriculomegaly, extra-axial collection or acute intracranial hemorrhage. Cervicomedullary junction and pituitary are within normal limits. Vascular: Major intracranial vascular flow voids are preserved. Skull and upper cervical spine: Negative visible cervical spine. Sinuses/Orbits: Grossly normal orbits soft tissues. Mild paranasal sinus mucosal thickening. Other: Trace right mastoid fluid. Grossly negative visible internal auditory structures. Negative scalp and face soft tissues. MRA HEAD FINDINGS Intermittently degraded by motion. Antegrade flow in the posterior circulation including codominant distal vertebral arteries. No distal vertebral stenosis. Patent PICA origins. Patent vertebrobasilar junction. No basilar stenosis. Tortuous basilar artery. Patent SCA and PCA origins. Symmetric bilateral PCA flow signal. Posterior communicating arteries are diminutive or absent. Antegrade flow in both ICA siphons. Bilateral siphon irregularity, no high-grade siphon stenosis suspected. Ophthalmic artery origins appear normal. Patent carotid termini. The right ACA A1 segment appears dominant and the left diminutive. The anterior communicating artery and visible ACA branches appear within normal limits. Both MCA M1 segments are patent. Motion artifact obscures detail of the bilateral MCA bifurcations. There does appear to be asymmetrically decreased flow signal in the posterior left MCA territory. IMPRESSION: 1. Acute infarcts in the Left MCA territory, most pronounced in the posterior division which corresponds to the CT abnormality yesterday. Cytotoxic edema with minimal mass effect. No associated hemorrhage. 2. Superimposed small acute white matter infarct in the posterior right MCA territory. Given the absence of other vascular territory infarcts this may constellation reflect synchronous small vessel disease rather than a recent embolic event from the heart or proximal aorta.  3. Motion degraded MRA is negative for large vessel occlusion. However, stenosis or occlusion is suspected in at least some posterior left MCA branches. 4. Chronic small and medium-sized vessel ischemia in the left deep gray matter nuclei and right parietal lobe. Electronically Signed   By: Genevie Ann M.D.   On: 07/26/2017 08:25   Dg Chest Port 1 View  Result Date: 07/25/2017 CLINICAL DATA:  Confusion and weakness EXAM: PORTABLE CHEST 1 VIEW COMPARISON:  05/23/2013 FINDINGS: Cardiac shadow is within normal limits. Increasing effusion and likely underlying infiltrate is noted on the right. The left lung remains clear. No bony abnormality is noted. IMPRESSION: Increasing opacity within the right hemithorax consistent with infiltrate and underlying effusion. Electronically Signed   By: Inez Catalina M.D.   On: 07/25/2017 14:09    Medications:  Prior to Admission:  No medications prior to admission.   Scheduled: . aspirin  325 mg Oral Daily  . enoxaparin (LOVENOX) injection  40 mg Subcutaneous Q24H  . lisinopril  10 mg Oral Daily  . mometasone-formoterol  2 puff Inhalation BID   Continuous: . sodium  chloride 10 mL/hr at 07/25/17 2233   SHU:[id number], ondansetron **OR** ondansetron (ZOFRAN) IV, oxyCODONE-acetaminophen  Assesment: He has a stroke.  That is being worked up.  He has a abnormal chest CT.  Lung mass is listed as a diagnosis but he does not actually have a lung mass.  He does have decreased volume in the right upper lobe.  He has a pleural effusion that looks like it is loculated.  He has COPD at baseline Active Problems:   COPD (chronic obstructive pulmonary disease) ([facility])   Lung mass   Pleural effusion    Plan: For thoracentesis today.  He is going to continue his work-up for stroke today.  Send thoracentesis fluid for cytology as well as cultures and this may give [place] a diagnosis as far as the concern about the upper lobe atelectasis.    LOS: 1 day    Jossalyn Forgione L 07/26/2017, 8:52 AM

## 2017-07-26 NOTE — Progress Notes (Signed)
PROGRESS NOTE  Melvin Hart PPJ:[id number] DOB: Jul 18, 1945 DOA: 07/25/2017 PCP: Henreitta Cea, MD  HPI/Recap of past 24 hours: Melvin Hart  is a 72 y.o. male, with history of COPD, hypertension was brought to hospital with complaints of confusion.  There is no family at bedside, history obtained from ED notes.  Patient apparently lives with his brother and brother called EMS stating that he was confused.  In the ED CT of the head showed subacute left MCA CVA, not a TPA candidate for unknown last time known normal and subacute presentation. CT of the chest showed interval development of loculated right pleural effusion with atelectasis or scarring in the right lung base.  Also shows development of right paratracheal and subcarinal adenopathy. Patient is a poor historian and unable to provide any significant history.  07/26/2017: Patient seen and examined at his bedside.  He is altered and does not answer questions appropriately.  Assessment/Plan: Active Problems:   COPD (chronic obstructive pulmonary disease) (HCC)   Lung mass   Pleural effusion  Acute metabolic encephalopathy secondary to subacute left MCA CVA vs others 2D echo in process LDL 75 goal of 70 Hemoglobin A1c pending Continue aspirin Start atorvastatin if the patient passes a swallow evaluation Speech therapy to evaluate PT/OT to evaluate Fall precautions  Right pleural effusion, unclear etiology/decreased volume in right upper lobe Right thoracentesis planned today with cytology and culture Pulmonology consulted and following due to suspected right upper lobe atelectasis.  Subacute MCA CVA Management as stated above  Physical debility PT to assess Fall precautions  COPD Continue breathing treatments as needed O2 supplementation as needed Pulmonology following   Code Status: Full  Family Communication: None at bedside  Disposition Plan: SNF/home when clinically  stable   Consultants:  Pulmonology  Procedures:  Right thoracentesis  Antimicrobials:  None  DVT prophylaxis: Subcu Lovenox daily   Objective: Vitals:   07/25/17 2020 07/25/17 2107 07/26/17 0553 07/26/17 1202  BP: 134/70  131/69   Pulse: 68  62   Resp: 20  20   Temp: 98.9 F (37.2 C)  98.5 F (36.9 C)   TempSrc:   Oral   SpO2: 97% 97% 98% 97%  Weight:      Height:        Intake/Output Summary (Last 24 hours) at 07/26/2017 1211 Last data filed at 07/26/2017 0800 Gross per 24 hour  Intake 294.5 ml  Output 900 ml  Net -605.5 ml   Filed Weights   07/25/17 1310  Weight: 81.6 kg (180 lb)    Exam:  . General: 72 y.o. year-old male frail in no acute distress.  Alert   But altered. . Cardiovascular: Regular rate and rhythm with no rubs or gallops.  No thyromegaly or JVD noted.   Marland Kitchen Respiratory: Clear to auscultation with no wheezes or rales. Good inspiratory effort. . Abdomen: Soft nontender nondistended with normal bowel sounds x4 quadrants. . Musculoskeletal: No lower extremity edema. 2/4 pulses in all 4 extremities. . Skin: No ulcerative lesions noted or rashes, . Psychiatry: Unable to assess due to altered mental status.   Data Reviewed: CBC: Recent Labs  Lab 07/25/17 1320 07/26/17 0438  WBC 7.1 6.9  NEUTROABS 5.1  --   HGB 9.4* 9.1*  HCT 29.3* 28.1*  MCV 79.6 79.6  PLT 278 580   Basic Metabolic Panel: Recent Labs  Lab 07/25/17 1320 07/26/17 0438  NA 136 136  K 4.2 4.0  CL 99* 99*  CO2 29 29  GLUCOSE 167* 89  BUN 14 10  CREATININE 0.95 0.80  CALCIUM 8.5* 8.4*   GFR: Estimated Creatinine Clearance: 83.1 mL/min (by C-G formula based on SCr of 0.8 mg/dL). Liver Function Tests: Recent Labs  Lab 07/25/17 1320 07/26/17 0438  AST 23 17  ALT 20 19  ALKPHOS 55 52  BILITOT 0.7 0.6  PROT 6.8 6.6  ALBUMIN 2.6* 2.5*   No results for input(s): LIPASE, AMYLASE in the last 168 hours. No results for input(s): AMMONIA in the last 168  hours. Coagulation Profile: No results for input(s): INR, PROTIME in the last 168 hours. Cardiac Enzymes: No results for input(s): CKTOTAL, CKMB, CKMBINDEX, TROPONINI in the last 168 hours. BNP (last 3 results) No results for input(s): PROBNP in the last 8760 hours. HbA1C: No results for input(s): HGBA1C in the last 72 hours. CBG: No results for input(s): GLUCAP in the last 168 hours. Lipid Profile: Recent Labs    07/26/17 0438  CHOL 114  HDL 25*  LDLCALC 75  TRIG 71  CHOLHDL 4.6   Thyroid Function Tests: No results for input(s): TSH, T4TOTAL, FREET4, T3FREE, THYROIDAB in the last 72 hours. Anemia Panel: Recent Labs    07/25/17 1559 07/25/17 1603  VITAMINB12 244  --   FOLATE  --  7.3  FERRITIN 875*  --   TIBC 130*  --   IRON 17*  --   RETICCTPCT 1.7  --    Urine analysis:    Component Value Date/Time   COLORURINE YELLOW 07/25/2017 1330   APPEARANCEUR CLEAR 07/25/2017 1330   LABSPEC 1.013 07/25/2017 1330   PHURINE 6.0 07/25/2017 1330   GLUCOSEU NEGATIVE 07/25/2017 1330   HGBUR SMALL (A) 07/25/2017 1330   BILIRUBINUR NEGATIVE 07/25/2017 1330   KETONESUR NEGATIVE 07/25/2017 1330   PROTEINUR 30 (A) 07/25/2017 1330   NITRITE NEGATIVE 07/25/2017 1330   LEUKOCYTESUR NEGATIVE 07/25/2017 1330   Sepsis Labs: @LABRCNTIP (procalcitonin:4,lacticidven:4)  )No results found for this or any previous visit (from the past 240 hour(s)).    Studies: Ct Head Wo Contrast  Result Date: 07/25/2017 CLINICAL DATA:  Mental status change today.  Combative. EXAM: CT HEAD WITHOUT CONTRAST TECHNIQUE: Contiguous axial images were obtained from the base of the skull through the vertex without intravenous contrast. COMPARISON:  None. FINDINGS: Brain: There is a moderate-sized posterior left MCA territory infarct predominantly involving the parietal lobe which is favored to be subacute. There could be minimal petechial hemorrhage, however no malignant hemorrhagic transformation is present.  Minimal rightward midline shift measures 4 mm. Small infarcts involving the left caudate head and right parietal cortex are of indeterminate acuity. No extra-axial fluid collection is seen. Periventricular white matter hypodensities are nonspecific but compatible with mild chronic small vessel ischemic disease. There is moderate cerebral atrophy. Vascular: Calcified atherosclerosis at the skull base. No hyperdense vessel. Skull: No fracture or destructive osseous lesion. Sinuses/Orbits: Minimal bilateral ethmoid air cell mucosal thickening. Trace right mastoid fluid. Unremarkable orbits. Other: None. IMPRESSION: 1. Subacute, moderate-sized posterior left MCA infarct. 2. Small age indeterminate right parietal and left caudate infarcts. 3. Mild chronic small vessel ischemic disease and moderate cerebral atrophy. Electronically Signed   By: Logan Bores M.D.   On: 07/25/2017 15:49   Ct Chest W Contrast  Result Date: 07/25/2017 CLINICAL DATA:  Altered mental status. EXAM: CT CHEST WITH CONTRAST TECHNIQUE: Multidetector CT imaging of the chest was performed during intravenous contrast administration. CONTRAST:  84mL ISOVUE-300 IOPAMIDOL (ISOVUE-300) INJECTION 61% COMPARISON:  CT scan of May 23, 2013.  FINDINGS: Cardiovascular: Atherosclerosis of thoracic aorta is noted without aneurysm or dissection. Coronary calcifications are noted. No pericardial effusion is noted. Mediastinum/Nodes: Thyroid gland and esophagus are unremarkable. 1.9 cm subcarinal adenopathy is noted 1.3 cm right paratracheal lymph node is noted. These are new since prior exam. Lungs/Pleura: No pneumothorax is noted. Left lung is clear. Emphysematous disease is noted in the right upper lobe. There is interval development of probable loculated right pleural effusion. Atelectasis or scarring is noted in the right lung base. Atelectasis of right upper lobe is noted; central endobronchial obstruction cannot be excluded. Upper Abdomen: Bilateral  nephrolithiasis is noted. Musculoskeletal: No chest wall abnormality. No acute or significant osseous findings. IMPRESSION: Interval development of loculated right pleural effusion, with atelectasis or scarring noted in right lung base. Atelectasis of right upper lobe is noted. The possibility of central obstructive endobronchial lesion cannot be excluded, and further evaluation with bronchoscopy is recommended. Interval development of right paratracheal and subcarinal adenopathy is noted; etiology is unknown, but metastatic disease cannot be excluded. Coronary artery calcifications are noted suggesting coronary artery disease. Bilateral nephrolithiasis. Aortic Atherosclerosis (ICD10-I70.0) and Emphysema (ICD10-J43.9). Electronically Signed   By: Marijo Conception, M.D.   On: 07/25/2017 15:52   Mr Jodene Nam Head Wo Contrast  Result Date: 07/26/2017 CLINICAL DATA:  72 year old male with altered mental status for 2 days. Subacute appearing posterior left MCA and other scattered age indeterminate infarcts on head CT yesterday. EXAM: MRI HEAD WITHOUT CONTRAST MRA HEAD WITHOUT CONTRAST TECHNIQUE: Multiplanar, multiecho pulse sequences of the brain and surrounding structures were obtained without intravenous contrast. Angiographic images of the head were obtained using MRA technique without contrast. COMPARISON:  Head CT 07/25/2017.  Cervical spine MRI 08/17/2011. FINDINGS: MRI HEAD FINDINGS Study is intermittently degraded by motion artifact despite repeated imaging attempts. Brain: Confluent restricted diffusion in the left parietal lobe affecting cortex and white matter in an area of 5 centimeters (series 6, image 41), corresponding to the most confluent area of hypodensity in the posterior left hemisphere on CT yesterday. Ischemia tracks into the left occipital lobe corresponding to the left MCA/PCA watershed territory. Multifocal scattered additional cortical and subcortical white matter restricted diffusion elsewhere in  the left MCA territory, including some of the anterior division (series 4, image 98). T2 and FLAIR hyperintensity in keeping with cytotoxic edema in the affected areas. No associated hemorrhage is evident. There is mild regional mass effect, including on the atrium of the left lateral ventricle. There is a solitary linear area of restricted diffusion in the contralateral right hemisphere affecting white matter in the inferior right parietal lobe seen on series 4, image 90. No associated hemorrhage or mass effect. No posterior fossa or pure posterior circulation restricted diffusion. Chronic lacunar infarcts in the left basal ganglia and left thalamus. The brainstem and cerebellum appear relatively spared. There is a small area of chronic cortical encephalomalacia in the right parietal lobe (series 9, image 33, which corresponds to the age indeterminate area seen by CT (#2 yesterday). No midline shift, evidence of mass lesion, ventriculomegaly, extra-axial collection or acute intracranial hemorrhage. Cervicomedullary junction and pituitary are within normal limits. Vascular: Major intracranial vascular flow voids are preserved. Skull and upper cervical spine: Negative visible cervical spine. Sinuses/Orbits: Grossly normal orbits soft tissues. Mild paranasal sinus mucosal thickening. Other: Trace right mastoid fluid. Grossly negative visible internal auditory structures. Negative scalp and face soft tissues. MRA HEAD FINDINGS Intermittently degraded by motion. Antegrade flow in the posterior circulation including codominant distal vertebral arteries.  No distal vertebral stenosis. Patent PICA origins. Patent vertebrobasilar junction. No basilar stenosis. Tortuous basilar artery. Patent SCA and PCA origins. Symmetric bilateral PCA flow signal. Posterior communicating arteries are diminutive or absent. Antegrade flow in both ICA siphons. Bilateral siphon irregularity, no high-grade siphon stenosis suspected. Ophthalmic  artery origins appear normal. Patent carotid termini. The right ACA A1 segment appears dominant and the left diminutive. The anterior communicating artery and visible ACA branches appear within normal limits. Both MCA M1 segments are patent. Motion artifact obscures detail of the bilateral MCA bifurcations. There does appear to be asymmetrically decreased flow signal in the posterior left MCA territory. IMPRESSION: 1. Acute infarcts in the Left MCA territory, most pronounced in the posterior division which corresponds to the CT abnormality yesterday. Cytotoxic edema with minimal mass effect. No associated hemorrhage. 2. Superimposed small acute white matter infarct in the posterior right MCA territory. Given the absence of other vascular territory infarcts this may constellation reflect synchronous small vessel disease rather than a recent embolic event from the heart or proximal aorta. 3. Motion degraded MRA is negative for large vessel occlusion. However, stenosis or occlusion is suspected in at least some posterior left MCA branches. 4. Chronic small and medium-sized vessel ischemia in the left deep gray matter nuclei and right parietal lobe. Electronically Signed   By: Genevie Ann M.D.   On: 07/26/2017 08:25   Mr Brain Wo Contrast  Result Date: 07/26/2017 CLINICAL DATA:  72 year old male with altered mental status for 2 days. Subacute appearing posterior left MCA and other scattered age indeterminate infarcts on head CT yesterday. EXAM: MRI HEAD WITHOUT CONTRAST MRA HEAD WITHOUT CONTRAST TECHNIQUE: Multiplanar, multiecho pulse sequences of the brain and surrounding structures were obtained without intravenous contrast. Angiographic images of the head were obtained using MRA technique without contrast. COMPARISON:  Head CT 07/25/2017.  Cervical spine MRI 08/17/2011. FINDINGS: MRI HEAD FINDINGS Study is intermittently degraded by motion artifact despite repeated imaging attempts. Brain: Confluent restricted  diffusion in the left parietal lobe affecting cortex and white matter in an area of 5 centimeters (series 6, image 41), corresponding to the most confluent area of hypodensity in the posterior left hemisphere on CT yesterday. Ischemia tracks into the left occipital lobe corresponding to the left MCA/PCA watershed territory. Multifocal scattered additional cortical and subcortical white matter restricted diffusion elsewhere in the left MCA territory, including some of the anterior division (series 4, image 98). T2 and FLAIR hyperintensity in keeping with cytotoxic edema in the affected areas. No associated hemorrhage is evident. There is mild regional mass effect, including on the atrium of the left lateral ventricle. There is a solitary linear area of restricted diffusion in the contralateral right hemisphere affecting white matter in the inferior right parietal lobe seen on series 4, image 90. No associated hemorrhage or mass effect. No posterior fossa or pure posterior circulation restricted diffusion. Chronic lacunar infarcts in the left basal ganglia and left thalamus. The brainstem and cerebellum appear relatively spared. There is a small area of chronic cortical encephalomalacia in the right parietal lobe (series 9, image 33, which corresponds to the age indeterminate area seen by CT (#2 yesterday). No midline shift, evidence of mass lesion, ventriculomegaly, extra-axial collection or acute intracranial hemorrhage. Cervicomedullary junction and pituitary are within normal limits. Vascular: Major intracranial vascular flow voids are preserved. Skull and upper cervical spine: Negative visible cervical spine. Sinuses/Orbits: Grossly normal orbits soft tissues. Mild paranasal sinus mucosal thickening. Other: Trace right mastoid fluid. Grossly negative visible  internal auditory structures. Negative scalp and face soft tissues. MRA HEAD FINDINGS Intermittently degraded by motion. Antegrade flow in the posterior  circulation including codominant distal vertebral arteries. No distal vertebral stenosis. Patent PICA origins. Patent vertebrobasilar junction. No basilar stenosis. Tortuous basilar artery. Patent SCA and PCA origins. Symmetric bilateral PCA flow signal. Posterior communicating arteries are diminutive or absent. Antegrade flow in both ICA siphons. Bilateral siphon irregularity, no high-grade siphon stenosis suspected. Ophthalmic artery origins appear normal. Patent carotid termini. The right ACA A1 segment appears dominant and the left diminutive. The anterior communicating artery and visible ACA branches appear within normal limits. Both MCA M1 segments are patent. Motion artifact obscures detail of the bilateral MCA bifurcations. There does appear to be asymmetrically decreased flow signal in the posterior left MCA territory. IMPRESSION: 1. Acute infarcts in the Left MCA territory, most pronounced in the posterior division which corresponds to the CT abnormality yesterday. Cytotoxic edema with minimal mass effect. No associated hemorrhage. 2. Superimposed small acute white matter infarct in the posterior right MCA territory. Given the absence of other vascular territory infarcts this may constellation reflect synchronous small vessel disease rather than a recent embolic event from the heart or proximal aorta. 3. Motion degraded MRA is negative for large vessel occlusion. However, stenosis or occlusion is suspected in at least some posterior left MCA branches. 4. Chronic small and medium-sized vessel ischemia in the left deep gray matter nuclei and right parietal lobe. Electronically Signed   By: Genevie Ann M.D.   On: 07/26/2017 08:25   US Carotid Bilateral  Result Date: 07/26/2017 CLINICAL DATA:  Left MCA acute strokes EXAM: BILATERAL CAROTID DUPLEX ULTRASOUND TECHNIQUE: Pearline Cables scale imaging, color Doppler and duplex ultrasound were performed of bilateral carotid and vertebral arteries in the neck. COMPARISON:   07/26/2017 MRI FINDINGS: Criteria: Quantification of carotid stenosis is based on velocity parameters that correlate the residual internal carotid diameter with NASCET-based stenosis levels, using the diameter of the distal internal carotid lumen as the denominator for stenosis measurement. The following velocity measurements were obtained: RIGHT ICA: 108/32 cm/sec CCA: 57/32 cm/sec SYSTOLIC ICA/CCA RATIO:  1.1 ECA:  104 cm/sec LEFT ICA: 193/20 cm/sec CCA: 20/25 cm/sec SYSTOLIC ICA/CCA RATIO:  4.0 ECA:  69 cm/sec RIGHT CAROTID ARTERY: Minor echogenic shadowing plaque formation. No hemodynamically significant right ICA stenosis, velocity elevation, or turbulent flow. Degree of narrowing less than 50%. RIGHT VERTEBRAL ARTERY:  Antegrade LEFT CAROTID ARTERY: Moderate to severe heterogeneous calcified bifurcation atherosclerosis. This results in luminal narrowing by grayscale imaging. Proximal left ICA velocity elevation and turbulent flow noted. Left proximal ICA velocity measures 193/25 centimeters/second. By ultrasound criteria left ICA stenosis estimated at 50-69%. LEFT VERTEBRAL ARTERY:  Antegrade IMPRESSION: Left greater than right carotid atherosclerosis. Left ICA stenosis estimated at 50-69% by ultrasound criteria Right ICA narrowing less than 50% Patent antegrade vertebral flow bilaterally Electronically Signed   By: Jerilynn Mages.  Shick M.D.   On: 07/26/2017 09:15   Dg Chest Port 1 View  Result Date: 07/25/2017 CLINICAL DATA:  Confusion and weakness EXAM: PORTABLE CHEST 1 VIEW COMPARISON:  05/23/2013 FINDINGS: Cardiac shadow is within normal limits. Increasing effusion and likely underlying infiltrate is noted on the right. The left lung remains clear. No bony abnormality is noted. IMPRESSION: Increasing opacity within the right hemithorax consistent with infiltrate and underlying effusion. Electronically Signed   By: Inez Catalina M.D.   On: 07/25/2017 14:09    Scheduled Meds: . aspirin  325 mg Oral Daily  .  enoxaparin (  LOVENOX) injection  40 mg Subcutaneous Q24H  . lisinopril  10 mg Oral Daily  . mometasone-formoterol  2 puff Inhalation BID    Continuous Infusions: . sodium chloride 10 mL/hr at 07/25/17 2233     LOS: 1 day     Kayleen Memos, MD [facility] Pager [phone]  If 7PM-7AM, please contact night-coverage [email or web address] Password [facility] 07/26/2017, 12:11 PM

## 2017-07-26 NOTE — Progress Notes (Signed)
Thoracentesis complete no signs of distress.  

## 2017-07-26 NOTE — Consult Note (Signed)
Melvin Melvin A. Merlene Laughter, MD     [email or web address]          Melvin Hart is an 72 y.o. male.   ASSESSMENT/PLAN: 1.  Altered mental status due to acute left hemispheric infarct: Risk factors age and hypertension.  I suspect that the patient most likely has intracranial disease as the etiology of the left hemispheric MCA infarct.  However, the patient does have right frontal infarct which occurred simultaneously and worrisome for embolic etiology.  A 30-day event monitor is therefore recommended.  He has been upgraded to aspirin 325 which I think is appropriate.  He also is on Lipitor. The patient would likely need to be placed in a skilled nursing facility. 2.  Poor nutritional state: Vitamin B12 and thiamine will be replaced.     Patient is a 72 year old white male who was found confused.  Last known time was unclear therefore the patient was not a candidate for thrombolysis or other interventional procedures.  Imaging showed evidence of bilateral ischemic stroke and hence a consultation.  He was on 81 mg of aspirin although it is unclear if he was compliant with this.  The review of systems is very limited given the confusion.   GENERAL: Patient is unkempt and malnourished appearing.  HEENT: This is normal.  No trauma and the neck is supple.  ABDOMEN: soft  EXTREMITIES: No edema; there is evidence of bruising involving the hands on both sides.  BACK: This is normal.  SKIN: Normal by inspection.    MENTAL STATUS:  The patient is awake and alert and cooperates with evaluation.  He clearly has significant language impairment both with reception and expression of language.  This causes significant impairment in the patient following instructions.  He is not oriented but only to self.  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  No drift of the other extremities.  The tone and bulk are normal.  Strength is difficult but about 4/5 throughout.  COORDINATION: Left finger to nose is normal, right finger to nose is normal, No rest tremor; no intention tremor; no postural tremor; no bradykinesia.  REFLEXES: Deep tendon reflexes are symmetrical and normal. Plantar reflexes are flexor bilaterally.   SENSATION: Normal to light pain.   NIH stroke scale 2, 2, 2, 2 total 8.    Blood pressure 120/65, pulse 80, temperature 98.6 F (37 C), temperature source Oral, resp. rate 20, height _0  (1.727 m), weight 180 lb (81.6 kg), SpO2 98 %.  Past Medical History:  Diagnosis Date  . COPD (chronic obstructive pulmonary disease) ([place]) 09/22/2010  . 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 History of Gout 10/12/2010  . History of Venereal disease 10/12/2010  . Hypertension     Past Surgical History:  Procedure Laterality Date  . INCISE AND DRAIN ABCESS    . LYMPH NODE BIOPSY      History reviewed. No pertinent family history.  Social History:  reports that he has been smoking cigarettes.  He has been smoking about 1.00 pack per day. He has never used smokeless tobacco. He reports that he drank alcohol. He reports that  he does not use drugs.  Allergies:  Allergies  Allergen Reactions  . Cephalexin Itching  . Codeine Anxiety    Medications: Prior to Admission medications   Not on File    Scheduled Meds: . aspirin  325 mg Oral Daily  . atorvastatin  40 mg Oral q1800  . enoxaparin (LOVENOX) injection  40 mg Subcutaneous Q24H  . lisinopril  10 mg Oral Daily  . mometasone-formoterol  2 puff Inhalation BID   Continuous Infusions: . sodium chloride 10 mL/hr at  07/25/17 2233   PRN Meds:.ipratropium-albuterol, ondansetron **OR** ondansetron (ZOFRAN) IV, oxyCODONE-acetaminophen     Results for orders placed or performed during the hospital encounter of 07/25/17 (from the past 48 hour(s))  CBC with Differential/Platelet     Status: Abnormal   Collection Time: 07/25/17  1:20 PM  Result Value Ref Range   WBC 7.1 4.0 - 10.5 K/uL   RBC 3.68 (L) 4.22 - 5.81 MIL/uL   Hemoglobin 9.4 (L) 13.0 - 17.0 g/dL   HCT 29.3 (L) 39.0 - 52.0 %   MCV 79.6 78.0 - 100.0 fL   MCH 25.5 (L) 26.0 - 34.0 pg   MCHC 32.1 30.0 - 36.0 g/dL   RDW 14.6 11.5 - 15.5 %   Platelets 278 150 - 400 K/uL   Neutrophils Relative % 72 %   Neutro Abs 5.1 1.7 - 7.7 K/uL   Lymphocytes Relative 21 %   Lymphs Abs 1.5 0.7 - 4.0 K/uL   Monocytes Relative 6 %   Monocytes Absolute 0.4 0.1 - 1.0 K/uL   Eosinophils Relative 1 %   Eosinophils Absolute 0.1 0.0 - 0.7 K/uL   Basophils Relative 0 %   Basophils Absolute 0.0 0.0 - 0.1 K/uL    Comment: Performed at [facility], [address], [place], [place] [address]  Comprehensive metabolic panel     Status: Abnormal   Collection Time: 07/25/17  1:20 PM  Result Value Ref Range   Sodium 136 135 - 145 mmol/L   Potassium 4.2 3.5 - 5.1 mmol/L   Chloride 99 (L) 101 - 111 mmol/L   CO2 29 22 - 32 mmol/L   Glucose, Bld 167 (H) 65 - 99 mg/dL   BUN 14 6 - 20 mg/dL   Creatinine, Ser 0.95 0.61 - 1.24 mg/dL   Calcium 8.5 (L) 8.9 - 10.3 mg/dL   Total Protein 6.8 6.5 - 8.1 g/dL   Albumin 2.6 (L) 3.5 - 5.0 g/dL   AST 23 15 - 41 U/L   ALT 20 17 - 63 U/L   Alkaline Phosphatase 55 38 - 126 U/L   Total Bilirubin 0.7 0.3 - 1.2 mg/dL   GFR calc non Af Amer >60 >60 mL/min   GFR calc Af Amer >60 >60 mL/min    Comment: (NOTE) The eGFR has been calculated using the CKD EPI equation. This calculation has not been validated in all clinical situations. eGFR's persistently <60 mL/min signify possible Chronic Kidney Disease.    Anion gap 8 5 - 15    Comment:  Performed at [facility], [address], [place], [place] [address]  Ethanol     Status: None   Collection Time: 07/25/17  1:20 PM  Result Value Ref Range   Alcohol, Ethyl (B) <10 <10 mg/dL    Comment:        LOWEST DETECTABLE LIMIT FOR SERUM ALCOHOL IS 10 mg/dL FOR MEDICAL PURPOSES ONLY Performed at [facility], [address] [place], [place] [address]  Urinalysis, Routine w reflex microscopic     Status: Abnormal   Collection Time: 07/25/17  1:30 PM  Result Value Ref Range   Color, Urine YELLOW YELLOW   APPearance CLEAR CLEAR   Specific Gravity, Urine 1.013 1.005 - 1.030   pH 6.0 5.0 - 8.0   Glucose, UA NEGATIVE NEGATIVE mg/dL   Hgb urine dipstick SMALL (A) NEGATIVE   Bilirubin Urine NEGATIVE NEGATIVE   Ketones, ur NEGATIVE NEGATIVE mg/dL   Protein, ur 30 (A) NEGATIVE mg/dL   Nitrite NEGATIVE NEGATIVE   Leukocytes, UA NEGATIVE NEGATIVE   RBC / HPF 11-20 0 - 5 RBC/hpf   WBC, UA 0-5 0 - 5 WBC/hpf   Bacteria, UA NONE SEEN NONE SEEN    Comment: Performed at [facility], [address], [place], [place] [address]  Rapid urine drug screen (hospital performed)     Status: None   Collection Time: 07/25/17  1:40 PM  Result Value Ref Range   Opiates NONE DETECTED NONE DETECTED   Cocaine NONE DETECTED NONE DETECTED   Benzodiazepines NONE DETECTED NONE DETECTED   Amphetamines NONE DETECTED NONE DETECTED   Tetrahydrocannabinol NONE DETECTED NONE DETECTED   Barbiturates NONE DETECTED NONE DETECTED    Comment: (NOTE) DRUG SCREEN FOR MEDICAL PURPOSES ONLY.  IF CONFIRMATION IS NEEDED FOR ANY PURPOSE, NOTIFY LAB WITHIN 5 DAYS. LOWEST DETECTABLE LIMITS FOR URINE DRUG SCREEN Drug Class                     Cutoff (ng/mL) Amphetamine and metabolites    1000 Barbiturate and metabolites    200 Benzodiazepine                 202 Tricyclics and metabolites     300 Opiates and metabolites        300 Cocaine and metabolites        300 THC                            50 Performed at  [facility], [address] [place], [place] [address]   I-stat troponin, ED     Status: None   Collection Time: 07/25/17  1:48 PM  Result Value Ref Range   Troponin i, poc 0.00 0.00 - 0.08 ng/mL   Comment 3            Comment: Due to the release kinetics of cTnI, a negative result within the first hours of the onset of symptoms does not rule out myocardial infarction with certainty. If myocardial infarction is still suspected, repeat the test at appropriate intervals.   Vitamin B12     Status: None   Collection Time: 07/25/17  3:59 PM  Result Value Ref Range   Vitamin B-12 244 180 - 914 pg/mL    Comment: (NOTE) This assay is not validated for testing neonatal or myeloproliferative syndrome specimens for Vitamin B12 levels. Performed at [facility], [place] [address], [place], [place] [address]   Iron and TIBC     Status: Abnormal   Collection Time: 07/25/17  3:59 PM  Result Value Ref Range   Iron 17 (L) 45 - 182 ug/dL   TIBC 130 (L) 250 - 450 ug/dL   Saturation Ratios 13 (L) 17.9 - 39.5 %   UIBC 113 ug/dL    Comment: Performed at [facility] [address], [place], [place] [address]  Ferritin  Status: Abnormal   Collection Time: 07/25/17  3:59 PM  Result Value Ref Range   Ferritin 875 (H) 24 - 336 ng/mL    Comment: Performed at [facility], [place] [address], [place], [place] [address]  Reticulocytes     Status: Abnormal   Collection Time: 07/25/17  3:59 PM  Result Value Ref Range   Retic Ct Pct 1.7 0.4 - 3.1 %   RBC. 3.66 (L) 4.22 - 5.81 MIL/uL   Retic Count, Absolute 62.2 19.0 - 186.0 K/uL    Comment: Performed at [facility], [address] [place], [place] [address]  Folate     Status: None   Collection Time: 07/25/17  4:03 PM  Result Value Ref Range   Folate 7.3 >5.9 ng/mL    Comment: Performed at [facility], [place] [address], [place], [place] [address]  Lipid panel     Status: Abnormal   Collection Time: 07/26/17  4:38 AM  Result  Value Ref Range   Cholesterol 114 0 - 200 mg/dL   Triglycerides 71 <150 mg/dL   HDL 25 (L) >40 mg/dL   Total CHOL/HDL Ratio 4.6 RATIO   VLDL 14 0 - 40 mg/dL   LDL Cholesterol 75 0 - 99 mg/dL    Comment:        Total Cholesterol/HDL:CHD Risk Coronary Heart Disease Risk Table                     Men   Women  1/2 Average Risk   3.4   3.3  Average Risk       5.0   4.4  2 X Average Risk   9.6   7.1  3 X Average Risk  23.4   11.0        Use the calculated Patient Ratio above and the CHD Risk Table to determine the patient's CHD Risk.        ATP III CLASSIFICATION (LDL):  <100     mg/dL   Optimal  100-129  mg/dL   Near or Above                    Optimal  130-159  mg/dL   Borderline  160-189  mg/dL   High  >190     mg/dL   Very High Performed at [facility], [address], [place], [place] [address]   CBC     Status: Abnormal   Collection Time: 07/26/17  4:38 AM  Result Value Ref Range   WBC 6.9 4.0 - 10.5 K/uL   RBC 3.53 (L) 4.22 - 5.81 MIL/uL   Hemoglobin 9.1 (L) 13.0 - 17.0 g/dL   HCT 28.1 (L) 39.0 - 52.0 %   MCV 79.6 78.0 - 100.0 fL   MCH 25.8 (L) 26.0 - 34.0 pg   MCHC 32.4 30.0 - 36.0 g/dL   RDW 14.8 11.5 - 15.5 %   Platelets 281 150 - 400 K/uL    Comment: Performed at [facility], [address], [place], [place] [address]  Comprehensive metabolic panel     Status: Abnormal   Collection Time: 07/26/17  4:38 AM  Result Value Ref Range   Sodium 136 135 - 145 mmol/L   Potassium 4.0 3.5 - 5.1 mmol/L   Chloride 99 (L) 101 - 111 mmol/L   CO2 29 22 - 32 mmol/L   Glucose, Bld 89 65 - 99 mg/dL   BUN 10 6 - 20  mg/dL   Creatinine, Ser 0.80 0.61 - 1.24 mg/dL   Calcium 8.4 (L) 8.9 - 10.3 mg/dL   Total Protein 6.6 6.5 - 8.1 g/dL   Albumin 2.5 (L) 3.5 - 5.0 g/dL   AST 17 15 - 41 U/L   ALT 19 17 - 63 U/L   Alkaline Phosphatase 52 38 - 126 U/L   Total Bilirubin 0.6 0.3 - 1.2 mg/dL   GFR calc non Af Amer >60 >60 mL/min   GFR calc Af Amer >60 >60 mL/min    Comment:  (NOTE) The eGFR has been calculated using the CKD EPI equation. This calculation has not been validated in all clinical situations. eGFR's persistently <60 mL/min signify possible Chronic Kidney Disease.    Anion gap 8 5 - 15    Comment: Performed at [facility], [address] [place], [place] [address]    Studies/Results:   ECHO - Left ventricle: The cavity size was normal. Wall thickness was   increased in a pattern of mild LVH. Systolic function was normal.   The estimated ejection fraction was in the range of 60% to 65%.   Wall motion was normal; there were no regional wall motion   abnormalities. Indeterminate diastolic function, possibly normal   for age. - Aortic valve: Mildly to moderately calcified annulus. Trileaflet;   mildly calcified leaflets. - Mitral valve: Mildly calcified annulus. There was trivial   regurgitation. - Right atrium: Central venous pressure (est): 3 mm Hg. - Atrial septum: No defect or patent foramen ovale was identified. - Tricuspid valve: There was mild regurgitation. - Pulmonary arteries: Systolic pressure could not be accurately   estimated. - Pericardium, extracardiac: Small, apical lateral pericardial   effusion.     MRI MRA BRAIN FINDINGS: MRI HEAD FINDINGS  Study is intermittently degraded by motion artifact despite repeated imaging attempts.  Brain: Confluent restricted diffusion in the left parietal lobe affecting cortex and white matter in an area of 5 centimeters (series 6, image 41), corresponding to the most confluent area of hypodensity in the posterior left hemisphere on CT yesterday. Ischemia tracks into the left occipital lobe corresponding to the left MCA/PCA watershed territory. Multifocal scattered additional cortical and subcortical white matter restricted diffusion elsewhere in the left MCA territory, including some of the anterior division (series 4, image 98). T2 and FLAIR hyperintensity in keeping  with cytotoxic edema in the affected areas. No associated hemorrhage is evident. There is mild regional mass effect, including on the atrium of the left lateral ventricle.  There is a solitary linear area of restricted diffusion in the contralateral right hemisphere affecting white matter in the inferior right parietal lobe seen on series 4, image 90. No associated hemorrhage or mass effect.  No posterior fossa or pure posterior circulation restricted diffusion.  Chronic lacunar infarcts in the left basal ganglia and left thalamus. The brainstem and cerebellum appear relatively spared. There is a small area of chronic cortical encephalomalacia in the right parietal lobe (series 9, image 33, which corresponds to the age indeterminate area seen by CT (#2 yesterday). No midline shift, evidence of mass lesion, ventriculomegaly, extra-axial collection or acute intracranial hemorrhage. Cervicomedullary junction and pituitary are within normal limits.  Vascular: Major intracranial vascular flow voids are preserved.  Skull and upper cervical spine: Negative visible cervical spine.  Sinuses/Orbits: Grossly normal orbits soft tissues. Mild paranasal sinus mucosal thickening.  Other: Trace right mastoid fluid. Grossly negative visible internal auditory structures. Negative scalp and face soft tissues.  MRA  HEAD FINDINGS  Intermittently degraded by motion.  Antegrade flow in the posterior circulation including codominant distal vertebral arteries. No distal vertebral stenosis. Patent PICA origins. Patent vertebrobasilar junction. No basilar stenosis. Tortuous basilar artery. Patent SCA and PCA origins. Symmetric bilateral PCA flow signal. Posterior communicating arteries are diminutive or absent.  Antegrade flow in both ICA siphons. Bilateral siphon irregularity, no high-grade siphon stenosis suspected. Ophthalmic artery origins appear normal. Patent carotid termini. The  right ACA A1 segment appears dominant and the left diminutive. The anterior communicating artery and visible ACA branches appear within normal limits. Both MCA M1 segments are patent. Motion artifact obscures detail of the bilateral MCA bifurcations. There does appear to be asymmetrically decreased flow signal in the posterior left MCA territory.  IMPRESSION: 1. Acute infarcts in the Left MCA territory, most pronounced in the posterior division which corresponds to the CT abnormality yesterday. Cytotoxic edema with minimal mass effect. No associated hemorrhage.  2. Superimposed small acute white matter infarct in the posterior right MCA territory. Given the absence of other vascular territory infarcts this may constellation reflect synchronous small vessel disease rather than a recent embolic event from the heart or proximal aorta.  3. Motion degraded MRA is negative for large vessel occlusion. However, stenosis or occlusion is suspected in at least some posterior left MCA branches.  4. Chronic small and medium-sized vessel ischemia in the left deep gray matter nuclei and right parietal lobe.      CAROTID DOPPLERS IMPRESSION: Left greater than right carotid atherosclerosis.  Left ICA stenosis estimated at 50-69% by ultrasound criteria  Right ICA narrowing less than 50%  Patent antegrade vertebral flow bilaterally       The brain MRI and MRA are reviewed impression.  There are several infarcts seen on DWI involving the left MCA particular the parietal frontal region.  There is single lesion involving the left frontal region.  There is also a right frontal small infarct.  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  SWI sequences are markedly degraded by motion artifact.  FLAIR imaging shows mild periventricular white matter disease.     Monish Haliburton A. Merlene Hart, M.D.  Diplomate, Tax adviser of Psychiatry and Neurology  ( Neurology). 07/26/2017, 6:00 PM

## 2017-07-27 LAB — BASIC METABOLIC PANEL
Anion gap: 6 (ref 5–15)
BUN: 10 mg/dL (ref 6–20)
CALCIUM: 8.5 mg/dL — AB (ref 8.9–10.3)
CO2: 29 mmol/L (ref 22–32)
CREATININE: 0.86 mg/dL (ref 0.61–1.24)
Chloride: 100 mmol/L — ABNORMAL LOW (ref 101–111)
Glucose, Bld: 101 mg/dL — ABNORMAL HIGH (ref 65–99)
Potassium: 4 mmol/L (ref 3.5–5.1)
SODIUM: 135 mmol/L (ref 135–145)

## 2017-07-27 LAB — CBC
HCT: 29.9 % — ABNORMAL LOW (ref 39.0–52.0)
HEMOGLOBIN: 9.5 g/dL — AB (ref 13.0–17.0)
MCH: 25.3 pg — ABNORMAL LOW (ref 26.0–34.0)
MCHC: 31.8 g/dL (ref 30.0–36.0)
MCV: 79.7 fL (ref 78.0–100.0)
PLATELETS: 306 10*3/uL (ref 150–400)
RBC: 3.75 MIL/uL — ABNORMAL LOW (ref 4.22–5.81)
RDW: 14.8 % (ref 11.5–15.5)
WBC: 7.1 10*3/uL (ref 4.0–10.5)

## 2017-07-27 LAB — HEMOGLOBIN A1C
Hgb A1c MFr Bld: 5.5 % (ref 4.8–5.6)
Mean Plasma Glucose: 111 mg/dL

## 2017-07-27 NOTE — Plan of Care (Signed)
  Problem: Acute Rehab PT Goals(only PT should resolve) Goal: Pt Will Go Supine/Side To Sit Outcome: Progressing Flowsheets (Taken 07/27/2017 1046) Pt will go Supine/Side to Sit: with modified independence Goal: Patient Will Transfer Sit To/From Stand Outcome: Progressing Flowsheets (Taken 07/27/2017 1046) Patient will transfer sit to/from stand: with supervision Goal: Pt Will Transfer Bed To Chair/Chair To Bed Outcome: Progressing Flowsheets (Taken 07/27/2017 1046) Pt will Transfer Bed to Chair/Chair to Bed: with supervision Goal: Pt Will Ambulate Outcome: Progressing Flowsheets (Taken 07/27/2017 1046) Pt will Ambulate: with min guard assist;100 feet;with least restrictive assistive device   10:46 AM, 07/27/17 Melvin Hart, MPT Physical Therapist with [facility] [phone] office [phone] mobile phone

## 2017-07-27 NOTE — Progress Notes (Signed)
PROGRESS NOTE    Melvin Hart  ZOX:[id number] DOB: [birth year] DOA: 07/25/2017 PCP: Henreitta Cea, MD     Brief Narrative:  72 year old man admitted from home on 5/13 due to confusion.  He has a history of COPD and hypertension.  CT of the head showed a subacute left MCA CVA, CT scan of the chest showed a loculated right pleural effusion and admission has been requested.   Assessment & Plan:   Active Problems:   COPD (chronic obstructive pulmonary disease) (HCC)   Lung mass   Pleural effusion   Acute metabolic encephalopathy -Secondary to subacute left MCA CVA. -Seen by neurology who is recommending a 30-day event monitor due to a simultaneous right frontal infarct which makes this worrisome for an embolic etiology. -Continue full dose aspirin for secondary stroke prevention. -LDL is 75, continue Lipitor. -2D echo: Ejection fraction of 60 to 65% with indeterminate diastolic function, normal wall motion.  Small apical lateral pericardial effusion with no tamponade physiology. -Carotid Dopplers: Left ICA stenosis 50 to 69%, right ICA stenosis less than 50%.  Antegrade vertebral flow bilaterally. -Seen by PT, will likely need short-term rehab. -Seen by speech therapy was recommended a dysphagia 3 diet with thin liquids.  Right loculated pleural effusion -Attempted thoracentesis on 5/14 which was unsuccessful due to loculation. -Seen by Dr. Luan Pulling, next step would be to refer to CT surgery for consideration of VATS.  Unclear if he would be a good candidate in light of his recent CVA with severe deficits.  We will need to further discuss with family scope of care.  To this effect we will also request palliative evaluation. -Possibility of central obstructive endobronchial lesion is not excluded on the CT, there is also subcarinal and paratracheal adenopathy with unknown etiology but metastatic disease cannot be ruled out.  COPD -Stable, not exacerbated at present.   DVT  prophylaxis: Lovenox Code Status: Full code Family Communication: Patient only Disposition Plan: SNF  Consultants:   Neurology  Pulmonology  Procedures:   As above  Antimicrobials:  Anti-infectives (From admission, onward)   None       Subjective: Lying in bed, opens eyes to voice, is very confused, speech is difficult to understand  Objective: Vitals:   07/26/17 2112 07/27/17 0453 07/27/17 0731 07/27/17 1420  BP: 128/64 118/67  101/69  Pulse: 70 84  70  Resp: 18 20  18   Temp: 99.2 F (37.3 C) 98.2 F (36.8 C)  98.3 F (36.8 C)  TempSrc: Oral   Oral  SpO2: 96% 96% 95% 98%  Weight:      Height:        Intake/Output Summary (Last 24 hours) at 07/27/2017 1850 Last data filed at 07/27/2017 1300 Gross per 24 hour  Intake 720 ml  Output -  Net 720 ml   Filed Weights   07/25/17 1310  Weight: 81.6 kg (180 lb)    Examination:  General exam: Awake, unable to fully assess orientation given garbled speech Respiratory system: Decreased breath sounds right base Cardiovascular system:RRR. No murmurs, rubs, gallops. Gastrointestinal system: Abdomen is nondistended, soft and nontender. No organomegaly or masses felt. Normal bowel sounds heard. Central nervous system: Appears confused, moves all 4 spontaneously Extremities: No C/C/E, +pedal pulses Skin: No rashes, lesions or ulcers Psychiatry: Appear mood & affect appropriate.     Data Reviewed: I have personally reviewed following labs and imaging studies  CBC: Recent Labs  Lab 07/25/17 1320 07/26/17 0438 07/27/17 0509  WBC 7.1 6.9 7.1  NEUTROABS 5.1  --   --   HGB 9.4* 9.1* 9.5*  HCT 29.3* 28.1* 29.9*  MCV 79.6 79.6 79.7  PLT 278 281 970   Basic Metabolic Panel: Recent Labs  Lab 07/25/17 1320 07/26/17 0438 07/27/17 0509  NA 136 136 135  K 4.2 4.0 4.0  CL 99* 99* 100*  CO2 29 29 29   GLUCOSE 167* 89 101*  BUN 14 10 10   CREATININE 0.95 0.80 0.86  CALCIUM 8.5* 8.4* 8.5*   GFR: Estimated  Creatinine Clearance: 77.3 mL/min (by C-G formula based on SCr of 0.86 mg/dL). Liver Function Tests: Recent Labs  Lab 07/25/17 1320 07/26/17 0438  AST 23 17  ALT 20 19  ALKPHOS 55 52  BILITOT 0.7 0.6  PROT 6.8 6.6  ALBUMIN 2.6* 2.5*   No results for input(s): LIPASE, AMYLASE in the last 168 hours. No results for input(s): AMMONIA in the last 168 hours. Coagulation Profile: No results for input(s): INR, PROTIME in the last 168 hours. Cardiac Enzymes: No results for input(s): CKTOTAL, CKMB, CKMBINDEX, TROPONINI in the last 168 hours. BNP (last 3 results) No results for input(s): PROBNP in the last 8760 hours. HbA1C: Recent Labs    07/25/17 1320  HGBA1C 5.5   CBG: No results for input(s): GLUCAP in the last 168 hours. Lipid Profile: Recent Labs    07/26/17 0438  CHOL 114  HDL 25*  LDLCALC 75  TRIG 71  CHOLHDL 4.6   Thyroid Function Tests: No results for input(s): TSH, T4TOTAL, FREET4, T3FREE, THYROIDAB in the last 72 hours. Anemia Panel: Recent Labs    07/25/17 1559 07/25/17 1603  VITAMINB12 244  --   FOLATE  --  7.3  FERRITIN 875*  --   TIBC 130*  --   IRON 17*  --   RETICCTPCT 1.7  --    Urine analysis:    Component Value Date/Time   COLORURINE YELLOW 07/25/2017 1330   APPEARANCEUR CLEAR 07/25/2017 1330   LABSPEC 1.013 07/25/2017 1330   PHURINE 6.0 07/25/2017 1330   GLUCOSEU NEGATIVE 07/25/2017 1330   HGBUR SMALL (A) 07/25/2017 1330   BILIRUBINUR NEGATIVE 07/25/2017 1330   KETONESUR NEGATIVE 07/25/2017 1330   PROTEINUR 30 (A) 07/25/2017 1330   NITRITE NEGATIVE 07/25/2017 1330   LEUKOCYTESUR NEGATIVE 07/25/2017 1330   Sepsis Labs: @LABRCNTIP (procalcitonin:4,lacticidven:4)  ) Recent Results (from the past 240 hour(s))  Stat Gram stain     Status: None   Collection Time: 07/26/17  4:19 PM  Result Value Ref Range Status   Specimen Description PLEURAL RIGHT  Final   Special Requests NONE  Final   Gram Stain   Final    CYTOSPIN SMEAR WBC  PRESENT, PREDOMINANTLY MONONUCLEAR NO ORGANISMS SEEN Performed at [facility] Performed at [facility], [address] [place], [place] [address]    Report Status 07/26/2017 FINAL  Final         Radiology Studies: Dg Chest 1 View  Result Date: 07/26/2017 CLINICAL DATA:  RIGHT pleural effusion post thoracentesis EXAM: CHEST  1 VIEW COMPARISON:  07/25/2017 FINDINGS: Normal heart size, mediastinal contours, and pulmonary vascularity. Persistent RIGHT pleural effusion with atelectasis throughout RIGHT lung. A more rounded area of opacity is seen at the mid to lower RIGHT hemithorax, question confluent atelectasis or infiltrate, without definite discrete mass by recent CT. No pneumothorax. Mild atelectasis at LEFT base. Bones demineralized. IMPRESSION: No pneumothorax following RIGHT thoracentesis. Electronically Signed   By: Lavonia Dana M.D.   On: 07/26/2017 15:56  Mr Virgel Paling XL Contrast  Result Date: 07/26/2017 CLINICAL DATA:  72 year old male with altered mental status for 2 days. Subacute appearing posterior left MCA and other scattered age indeterminate infarcts on head CT yesterday. EXAM: MRI HEAD WITHOUT CONTRAST MRA HEAD WITHOUT CONTRAST TECHNIQUE: Multiplanar, multiecho pulse sequences of the brain and surrounding structures were obtained without intravenous contrast. Angiographic images of the head were obtained using MRA technique without contrast. COMPARISON:  Head CT 07/25/2017.  Cervical spine MRI 08/17/2011. FINDINGS: MRI HEAD FINDINGS Study is intermittently degraded by motion artifact despite repeated imaging attempts. Brain: Confluent restricted diffusion in the left parietal lobe affecting cortex and white matter in an area of 5 centimeters (series 6, image 41), corresponding to the most confluent area of hypodensity in the posterior left hemisphere on CT yesterday. Ischemia tracks into the left occipital lobe corresponding to the left MCA/PCA watershed territory.  Multifocal scattered additional cortical and subcortical white matter restricted diffusion elsewhere in the left MCA territory, including some of the anterior division (series 4, image 98). T2 and FLAIR hyperintensity in keeping with cytotoxic edema in the affected areas. No associated hemorrhage is evident. There is mild regional mass effect, including on the atrium of the left lateral ventricle. There is a solitary linear area of restricted diffusion in the contralateral right hemisphere affecting white matter in the inferior right parietal lobe seen on series 4, image 90. No associated hemorrhage or mass effect. No posterior fossa or pure posterior circulation restricted diffusion. Chronic lacunar infarcts in the left basal ganglia and left thalamus. The brainstem and cerebellum appear relatively spared. There is a small area of chronic cortical encephalomalacia in the right parietal lobe (series 9, image 33, which corresponds to the age indeterminate area seen by CT (#2 yesterday). No midline shift, evidence of mass lesion, ventriculomegaly, extra-axial collection or acute intracranial hemorrhage. Cervicomedullary junction and pituitary are within normal limits. Vascular: Major intracranial vascular flow voids are preserved. Skull and upper cervical spine: Negative visible cervical spine. Sinuses/Orbits: Grossly normal orbits soft tissues. Mild paranasal sinus mucosal thickening. Other: Trace right mastoid fluid. Grossly negative visible internal auditory structures. Negative scalp and face soft tissues. MRA HEAD FINDINGS Intermittently degraded by motion. Antegrade flow in the posterior circulation including codominant distal vertebral arteries. No distal vertebral stenosis. Patent PICA origins. Patent vertebrobasilar junction. No basilar stenosis. Tortuous basilar artery. Patent SCA and PCA origins. Symmetric bilateral PCA flow signal. Posterior communicating arteries are diminutive or absent. Antegrade flow  in both ICA siphons. Bilateral siphon irregularity, no high-grade siphon stenosis suspected. Ophthalmic artery origins appear normal. Patent carotid termini. The right ACA A1 segment appears dominant and the left diminutive. The anterior communicating artery and visible ACA branches appear within normal limits. Both MCA M1 segments are patent. Motion artifact obscures detail of the bilateral MCA bifurcations. There does appear to be asymmetrically decreased flow signal in the posterior left MCA territory. IMPRESSION: 1. Acute infarcts in the Left MCA territory, most pronounced in the posterior division which corresponds to the CT abnormality yesterday. Cytotoxic edema with minimal mass effect. No associated hemorrhage. 2. Superimposed small acute white matter infarct in the posterior right MCA territory. Given the absence of other vascular territory infarcts this may constellation reflect synchronous small vessel disease rather than a recent embolic event from the heart or proximal aorta. 3. Motion degraded MRA is negative for large vessel occlusion. However, stenosis or occlusion is suspected in at least some posterior left MCA branches. 4. Chronic small and medium-sized vessel  ischemia in the left deep gray matter nuclei and right parietal lobe. Electronically Signed   By: Genevie Ann M.D.   On: 07/26/2017 08:25   Mr Brain Wo Contrast  Result Date: 07/26/2017 CLINICAL DATA:  72 year old male with altered mental status for 2 days. Subacute appearing posterior left MCA and other scattered age indeterminate infarcts on head CT yesterday. EXAM: MRI HEAD WITHOUT CONTRAST MRA HEAD WITHOUT CONTRAST TECHNIQUE: Multiplanar, multiecho pulse sequences of the brain and surrounding structures were obtained without intravenous contrast. Angiographic images of the head were obtained using MRA technique without contrast. COMPARISON:  Head CT 07/25/2017.  Cervical spine MRI 08/17/2011. FINDINGS: MRI HEAD FINDINGS Study is  intermittently degraded by motion artifact despite repeated imaging attempts. Brain: Confluent restricted diffusion in the left parietal lobe affecting cortex and white matter in an area of 5 centimeters (series 6, image 41), corresponding to the most confluent area of hypodensity in the posterior left hemisphere on CT yesterday. Ischemia tracks into the left occipital lobe corresponding to the left MCA/PCA watershed territory. Multifocal scattered additional cortical and subcortical white matter restricted diffusion elsewhere in the left MCA territory, including some of the anterior division (series 4, image 98). T2 and FLAIR hyperintensity in keeping with cytotoxic edema in the affected areas. No associated hemorrhage is evident. There is mild regional mass effect, including on the atrium of the left lateral ventricle. There is a solitary linear area of restricted diffusion in the contralateral right hemisphere affecting white matter in the inferior right parietal lobe seen on series 4, image 90. No associated hemorrhage or mass effect. No posterior fossa or pure posterior circulation restricted diffusion. Chronic lacunar infarcts in the left basal ganglia and left thalamus. The brainstem and cerebellum appear relatively spared. There is a small area of chronic cortical encephalomalacia in the right parietal lobe (series 9, image 33, which corresponds to the age indeterminate area seen by CT (#2 yesterday). No midline shift, evidence of mass lesion, ventriculomegaly, extra-axial collection or acute intracranial hemorrhage. Cervicomedullary junction and pituitary are within normal limits. Vascular: Major intracranial vascular flow voids are preserved. Skull and upper cervical spine: Negative visible cervical spine. Sinuses/Orbits: Grossly normal orbits soft tissues. Mild paranasal sinus mucosal thickening. Other: Trace right mastoid fluid. Grossly negative visible internal auditory structures. Negative scalp and  face soft tissues. MRA HEAD FINDINGS Intermittently degraded by motion. Antegrade flow in the posterior circulation including codominant distal vertebral arteries. No distal vertebral stenosis. Patent PICA origins. Patent vertebrobasilar junction. No basilar stenosis. Tortuous basilar artery. Patent SCA and PCA origins. Symmetric bilateral PCA flow signal. Posterior communicating arteries are diminutive or absent. Antegrade flow in both ICA siphons. Bilateral siphon irregularity, no high-grade siphon stenosis suspected. Ophthalmic artery origins appear normal. Patent carotid termini. The right ACA A1 segment appears dominant and the left diminutive. The anterior communicating artery and visible ACA branches appear within normal limits. Both MCA M1 segments are patent. Motion artifact obscures detail of the bilateral MCA bifurcations. There does appear to be asymmetrically decreased flow signal in the posterior left MCA territory. IMPRESSION: 1. Acute infarcts in the Left MCA territory, most pronounced in the posterior division which corresponds to the CT abnormality yesterday. Cytotoxic edema with minimal mass effect. No associated hemorrhage. 2. Superimposed small acute white matter infarct in the posterior right MCA territory. Given the absence of other vascular territory infarcts this may constellation reflect synchronous small vessel disease rather than a recent embolic event from the heart or proximal aorta. 3. Motion degraded  MRA is negative for large vessel occlusion. However, stenosis or occlusion is suspected in at least some posterior left MCA branches. 4. Chronic small and medium-sized vessel ischemia in the left deep gray matter nuclei and right parietal lobe. Electronically Signed   By: Genevie Ann M.D.   On: 07/26/2017 08:25   US Carotid Bilateral  Result Date: 07/26/2017 CLINICAL DATA:  Left MCA acute strokes EXAM: BILATERAL CAROTID DUPLEX ULTRASOUND TECHNIQUE: Pearline Cables scale imaging, color Doppler and  duplex ultrasound were performed of bilateral carotid and vertebral arteries in the neck. COMPARISON:  07/26/2017 MRI FINDINGS: Criteria: Quantification of carotid stenosis is based on velocity parameters that correlate the residual internal carotid diameter with NASCET-based stenosis levels, using the diameter of the distal internal carotid lumen as the denominator for stenosis measurement. The following velocity measurements were obtained: RIGHT ICA: 108/32 cm/sec CCA: 53/97 cm/sec SYSTOLIC ICA/CCA RATIO:  1.1 ECA:  104 cm/sec LEFT ICA: 193/20 cm/sec CCA: 67/34 cm/sec SYSTOLIC ICA/CCA RATIO:  4.0 ECA:  69 cm/sec RIGHT CAROTID ARTERY: Minor echogenic shadowing plaque formation. No hemodynamically significant right ICA stenosis, velocity elevation, or turbulent flow. Degree of narrowing less than 50%. RIGHT VERTEBRAL ARTERY:  Antegrade LEFT CAROTID ARTERY: Moderate to severe heterogeneous calcified bifurcation atherosclerosis. This results in luminal narrowing by grayscale imaging. Proximal left ICA velocity elevation and turbulent flow noted. Left proximal ICA velocity measures 193/25 centimeters/second. By ultrasound criteria left ICA stenosis estimated at 50-69%. LEFT VERTEBRAL ARTERY:  Antegrade IMPRESSION: Left greater than right carotid atherosclerosis. Left ICA stenosis estimated at 50-69% by ultrasound criteria Right ICA narrowing less than 50% Patent antegrade vertebral flow bilaterally Electronically Signed   By: Jerilynn Mages.  Shick M.D.   On: 07/26/2017 09:15   US Thoracentesis Asp Pleural Space W/img Guide  Result Date: 07/26/2017 INDICATION: RIGHT pleural effusion, for diagnostic and therapeutic thoracentesis EXAM: ULTRASOUND GUIDED DIAGNOSTIC RIGHT THORACENTESIS MEDICATIONS: None. COMPLICATIONS: None immediate. PROCEDURE: Procedure, benefits, and risks of procedure were discussed with patient. Written informed consent for procedure was obtained. Time out protocol followed. Pleural effusion localized by  ultrasound at the posterior RIGHT hemithorax. RIGHT pleural effusion is markedly loculated and highly complex in appearance. Skin prepped and draped in usual sterile fashion. Skin and soft tissues anesthetized with 10 mL of 1% lidocaine. 8 French thoracentesis catheter placed into the RIGHT pleural space. Only 23 mL of slightly cloudy RIGHT pleural fluid was able to be aspirated by syringe. Procedure tolerated well by patient without immediate complication. FINDINGS: Only 23 mL of RIGHT pleural fluid could be removed. Entire sample was sent to the laboratory as requested by the clinical team. IMPRESSION: Successful ultrasound guided RIGHT thoracentesis yielding 23 mL of pleural fluid. Electronically Signed   By: Lavonia Dana M.D.   On: 07/26/2017 16:02        Scheduled Meds: . aspirin  325 mg Oral Daily  . atorvastatin  40 mg Oral q1800  . enoxaparin (LOVENOX) injection  40 mg Subcutaneous Q24H  . lisinopril  10 mg Oral Daily  . mouth rinse  15 mL Mouth Rinse BID  . mometasone-formoterol  2 puff Inhalation BID  . thiamine injection  100 mg Intravenous Daily   Continuous Infusions: . sodium chloride 10 mL/hr at 07/25/17 2233     LOS: 2 days    Time spent: 25 minutes.     Lelon Frohlich, MD [facility] Pager [phone]  If 7PM-7AM, please contact night-coverage [email or web address] Password [email or web address] 07/27/2017, 6:50 PM

## 2017-07-27 NOTE — Evaluation (Signed)
Occupational Therapy Evaluation Patient Details Name: Melvin Hart MRN: [id number] DOB: October 12, 1945 Today's Date: 07/27/2017    History of Present Illness Melvin Hart  is a 72 y.o. male, with history of COPD, hypertension was brought to hospital with complaints of confusion.  There is no family at bedside, history obtained from ED notes.  Patient apparently lives with his brother and brother called EMS stating that he was confused. MRI positive for CVA   Clinical Impression   Pt received supine in bed, agreeable to OT evaluation. Pt unable to provide PLOF information, stating "I don't know" for majority of questions. Pt oriented to self only this am, able to follow simple commands consistently. Pt's BUE strength appears to be functional at 4/5, pt unable to follow coordination testing however appears [facility] with task completion. Pt stating he is right handed but using left hand for grooming tasks, unsure if due to possible inattention to RUE. Pt unable to follow vision testing, appears to prefer left visual field, is able to locate items when cued to look towards the right. Pt will benefit from continued OT services in acute care to further assess functional status and work towards greater independence. Pt requires assistance with ADLs and functional mobility and is not safe to return home without supervision, therefore recommend SNF on discharge to improve independence and safety during functional tasks.     Follow Up Recommendations    SNF; 24/7 supervision   Equipment Recommendations  None recommended by OT       Precautions / Restrictions Precautions Precautions: Fall Restrictions Weight Bearing Restrictions: No      Mobility Bed Mobility Overal bed mobility: Needs Assistance Bed Mobility: Supine to Sit     Supine to sit: Supervision     General bed mobility comments: cuing to initiate sequencing  Transfers Overall transfer level: Needs assistance Equipment used: Rolling  walker (2 wheeled) Transfers: Sit to/from [facility] Sit to Stand: Min guard Stand pivot transfers: Min guard                ADL either performed or assessed with clinical judgement   ADL Overall ADL's : Needs assistance/impaired     Grooming: Wash/dry hands;Wash/dry face;Modified independent;Sitting               Lower Body Dressing: Supervision/safety;Sitting/lateral leans   Toilet Transfer: Economist and Hygiene: Supervision/safety;Sitting/lateral lean         General ADL Comments: Pt requiring cuing for initiating tasks     Vision Baseline Vision/History: (unable to remember if wears glasses) Patient Visual Report: Other (comment)(Per pt: "vision seems confused") Vision Assessment?: Vision impaired- to be further tested in functional context Additional Comments: Pt unable to follow formal vision testing, appears to prefer left gaze, will look to right for objects when prompted            Pertinent Vitals/Pain Pain Assessment: No/denies pain     Hand Dominance Right   Extremity/Trunk Assessment Upper Extremity Assessment Upper Extremity Assessment: Overall WFL for tasks assessed(strength grossly 4/5 in BUE; coordination intact)   Lower Extremity Assessment Lower Extremity Assessment: Defer to PT evaluation   Cervical / Trunk Assessment Cervical / Trunk Assessment: Normal   Communication Communication Communication: No difficulties   Cognition Arousal/Alertness: Awake/alert Behavior During Therapy: WFL for tasks assessed/performed Overall Cognitive Status: Impaired/Different from baseline Area of Impairment: Orientation;Memory  Orientation Level: Disoriented to;Place;Time;Situation   Memory: Decreased short-term memory         General Comments: Pt confused during evaluation, unable to provide PLOF. Able to follow simple commands  consistently              Home Living Family/patient expects to be discharged to:: Private residence Living Arrangements: Other relatives(brother) Available Help at Discharge: Family;Available PRN/intermittently                             Additional Comments: Pt unable to provide any additional PLOF information      Prior Functioning/Environment Level of Independence: Independent                 OT Problem List: Decreased activity tolerance;Decreased safety awareness;Decreased knowledge of use of DME or AE;Decreased cognition      OT Treatment/Interventions: Self-care/ADL training;Therapeutic exercise;Neuromuscular education;Therapeutic activities;Patient/family education    OT Goals(Current goals can be found in the care plan section) Acute Rehab OT Goals Patient Stated Goal: To get better OT Goal Formulation: Patient unable to participate in goal setting Time For Goal Achievement: 08/10/17 Potential to Achieve Goals: Good  OT Frequency: Min 2X/week    End of Session Equipment Utilized During Treatment: Gait belt;Rolling walker Nurse Communication: Mobility status;Other (comment)(chair alarm set)  Activity Tolerance: Patient tolerated treatment well Patient left: in chair;with call bell/phone within reach;with chair alarm set(PT in room)  OT Visit Diagnosis: Muscle weakness (generalized) (M62.81);Other symptoms and signs involving cognitive function                Time: 6759-1638 OT Time Calculation (min): 36 min Charges:  OT General Charges $OT Visit: 1 Visit OT Evaluation $OT Eval Moderate Complexity: [place], OTR/L  [phone] 07/27/2017, 8:40 AM

## 2017-07-27 NOTE — Clinical Social Work Note (Signed)
Brother, Enis Slipper, states that he will speak with "the rest of them" and determine if they are interested to STR at [facility].     Jamyrah Saur, Clydene Pugh, LCSW

## 2017-07-27 NOTE — Evaluation (Signed)
Physical Therapy Evaluation Patient Details Name: Melvin Hart MRN: [id number] DOB: 1945/12/19 Today's Date: 07/27/2017   History of Present Illness  Bralen Wiltgen  is a 72 y.o. male, with history of COPD, hypertension was brought to hospital with complaints of confusion.  There is no family at bedside, history obtained from ED notes.  Patient apparently lives with his brother and brother called EMS stating that he was confused. MRI positive for CVA    Clinical Impression  Patient presents up in chair (assisted by OT), agreeable for therapy and appears slightly confused with inability to provide information about living situation or PLOF.  Patient very unsteady on feet with tendency to lean on nearby objects for support when standing, requires frequent verbal/tactile cueing for safety, limited for ambulation due mostly to c./o dizziness and put back to bed after therapy - RN notified.  Patient will benefit from continued physical therapy in hospital and recommended venue below to increase strength, balance, endurance for safe ADLs and gait.    Follow Up Recommendations SNF;Supervision/Assistance - 24 hour    Equipment Recommendations  Rolling walker with 5" wheels    Recommendations for Other Services       Precautions / Restrictions Precautions Precautions: Fall Restrictions Weight Bearing Restrictions: No      Mobility  Bed Mobility Overal bed mobility: Needs Assistance Bed Mobility: Supine to Sit;Sit to Supine     Supine to sit: Supervision Sit to supine: Supervision   General bed mobility comments: cuing to initiate sequencing  Transfers Overall transfer level: Needs assistance Equipment used: None;Rolling walker (2 wheeled) Transfers: Sit to/from [facility] Sit to Stand: Min guard Stand pivot transfers: Min guard       General transfer comment: unsteady on feet with near loss of balance, has to reach for nearby objects for support when not using  AD  Ambulation/Gait Ambulation/Gait assistance: Min assist Ambulation Distance (Feet): 30 Feet Assistive device: Rolling walker (2 wheeled);None Gait Pattern/deviations: Decreased step length - right;Decreased step length - left;Decreased stride length Gait velocity: slow   General Gait Details: slow unsteady cadence with tendency to lean on nearby objects for support when not using RW, able to ambulate into hallway without loss of balance, but limited mostly due to c/o dizziness/fatigue  Stairs            Wheelchair Mobility    Modified Rankin (Stroke Patients Only)       Balance Overall balance assessment: Needs assistance Sitting-balance support: Feet supported;No upper extremity supported Sitting balance-Leahy Scale: Good     Standing balance support: No upper extremity supported;During functional activity Standing balance-Leahy Scale: Poor Standing balance comment: fair/poor without AD, fair using RW                             Pertinent Vitals/Pain Pain Assessment: No/denies pain    Home Living Family/patient expects to be discharged to:: Private residence Living Arrangements: Other relatives(brother per chart) Available Help at Discharge: Family;Available PRN/intermittently             Additional Comments: Patient is poor historian    Prior Function Level of Independence: Independent               Hand Dominance   Dominant Hand: Right    Extremity/Trunk Assessment   Upper Extremity Assessment Upper Extremity Assessment: Defer to OT evaluation    Lower Extremity Assessment Lower Extremity Assessment: Generalized weakness    Cervical /  Trunk Assessment Cervical / Trunk Assessment: Normal  Communication   Communication: No difficulties  Cognition Arousal/Alertness: Awake/alert Behavior During Therapy: WFL for tasks assessed/performed Overall Cognitive Status: Impaired/Different from baseline Area of Impairment:  Orientation;Memory                 Orientation Level: Disoriented to;Place;Time;Situation   Memory: Decreased short-term memory         General Comments: Pt confused during evaluation, unable to provide PLOF. Able to follow simple commands consistently      General Comments      Exercises     Assessment/Plan    PT Assessment Patient needs continued PT services  PT Problem List Decreased strength;Decreased activity tolerance;Decreased balance;Decreased mobility       PT Treatment Interventions Gait training;Stair training;Functional mobility training;Therapeutic activities;Therapeutic exercise;Patient/family education    PT Goals (Current goals can be found in the Care Plan section)  Acute Rehab PT Goals Patient Stated Goal: return home PT Goal Formulation: With patient Time For Goal Achievement: 08/10/17 Potential to Achieve Goals: Good    Frequency 7X/week   Barriers to discharge        Co-evaluation               AM-PAC PT "6 Clicks" Daily Activity  Outcome Measure Difficulty turning over in bed (including adjusting bedclothes, sheets and blankets)?: None Difficulty moving from lying on back to sitting on the side of the bed? : None Difficulty sitting down on and standing up from a chair with arms (e.g., wheelchair, bedside commode, etc,.)?: A Little Help needed moving to and from a bed to chair (including a wheelchair)?: A Little Help needed walking in hospital room?: A Little Help needed climbing 3-5 steps with a railing? : A Little 6 Click Score: 20    End of Session Equipment Utilized During Treatment: Gait belt Activity Tolerance: Patient tolerated treatment well;Patient limited by fatigue(Patient limited by dizziness) Patient left: in bed;with bed alarm set;with call bell/phone within reach Nurse Communication: Mobility status PT Visit Diagnosis: Unsteadiness on feet (R26.81);Other abnormalities of gait and mobility (R26.89);Muscle  weakness (generalized) (M62.81)    Time: 7322-0254 PT Time Calculation (min) (ACUTE ONLY): 19 min   Charges:   PT Evaluation $PT Eval Moderate Complexity: 1 Mod PT Treatments $Therapeutic Activity: 8-22 mins   PT G Codes:        10:42 AM, 08/20/17 Lonell Grandchild, MPT Physical Therapist with [facility] [phone] office [phone] mobile phone

## 2017-07-27 NOTE — Plan of Care (Signed)
  Problem: Acute Rehab OT Goals (only OT should resolve) Goal: Pt. Will Perform Grooming Flowsheets (Taken 07/27/2017 0844) Pt Will Perform Grooming: with modified independence;standing Goal: Pt. Will Perform Upper Body Dressing Flowsheets (Taken 07/27/2017 0844) Pt Will Perform Upper Body Dressing: with modified independence;sitting Goal: Pt. Will Transfer To Toilet Flowsheets (Taken 07/27/2017 0844) Pt Will Transfer to Toilet: with modified independence;regular height toilet;ambulating Goal: Pt. Will Perform Toileting-Clothing Manipulation Flowsheets (Taken 07/27/2017 0844) Pt Will Perform Toileting - Clothing Manipulation and hygiene: with modified independence;sitting/lateral leans;sit to/from stand Goal: Pt/Caregiver Will Perform Home Exercise Program Flowsheets (Taken 07/27/2017 0844) Pt/caregiver will Perform Home Exercise Program: Increased strength;Both right and left upper extremity;With Supervision;With written HEP provided

## 2017-07-27 NOTE — NC FL2 (Signed)
[place] LEVEL OF CARE SCREENING TOOL     IDENTIFICATION  Patient Name: Melvin Hart Birthdate: 07-31-1945 Sex: male Admission Date (Current Location): 07/25/2017  [facility] and [place] Number:  [employer] and Address:  [place] [address] [place]      Provider Number: [id number]  Attending Physician Name and Address:  Isaac Bliss, Olam Idler*  Relative Name and Phone Number:       Current Level of Care: Hospital Recommended Level of Care: [place] Prior Approval Number:    Date Approved/Denied:   PASRR Number: [id number]  Discharge Plan: SNF    Current Diagnoses: Patient Active Problem List   Diagnosis Date Noted  . Lung mass 07/25/2017  . Pleural effusion 07/25/2017  . History of Venereal disease 10/12/2010  . History of Gout 10/12/2010  . 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 COPD (chronic obstructive pulmonary disease) ([place]) 09/22/2010    Orientation RESPIRATION BLADDER Height & Weight     Self  Normal Continent Weight: 180 lb (81.6 kg) Height:  5\' 8"  (172.7 cm)  BEHAVIORAL SYMPTOMS/MOOD NEUROLOGICAL BOWEL NUTRITION STATUS      Continent Diet(DYS 3)  AMBULATORY STATUS COMMUNICATION OF NEEDS Skin   Limited Assist Verbally Normal                       Personal Care Assistance Level of Assistance  Bathing, Feeding, Dressing Bathing Assistance: Limited assistance Feeding assistance: Independent Dressing Assistance: Limited assistance     Functional Limitations Info  Sight, Hearing, Speech Sight Info: Adequate Hearing Info: Adequate Speech Info: Adequate    SPECIAL CARE FACTORS FREQUENCY  PT (By licensed PT), OT (By licensed OT), Speech therapy     PT Frequency: 5x/week OT Frequency: 3x/week     Speech Therapy Frequency: 3x/week      Contractures Contractures Info: Not present    Additional Factors Info  Code Status, Allergies Code Status  Info: Full Code Allergies Info: Cephalexin, Codeine           Current Medications (07/27/2017):  This is the current hospital active medication list Current Facility-Administered Medications  Medication Dose Route Frequency Provider Last Rate Last Dose  . 0.9 %  sodium chloride infusion   Intravenous Continuous Oswald Hillock, MD 10 mL/hr at 07/25/17 2233    . aspirin tablet 325 mg  325 mg Oral Daily Oswald Hillock, MD   325 mg at 07/27/17 1113  . atorvastatin (LIPITOR) tablet 40 mg  40 mg Oral q1800 Irene Pap N, DO   40 mg at 07/26/17 1753  . enoxaparin (LOVENOX) injection 40 mg  40 mg Subcutaneous Q24H Oswald Hillock, MD   40 mg at 07/26/17 2119  . ipratropium-albuterol (DUONEB) 0.5-2.5 (3) MG/3ML nebulizer solution 3 mL  3 mL Nebulization Q6H PRN Oswald Hillock, MD      . lisinopril (PRINIVIL,ZESTRIL) tablet 10 mg  10 mg Oral Daily Oswald Hillock, MD   10 mg at 07/27/17 1112  . MEDLINE mouth rinse  15 mL Mouth Rinse BID Irene Pap N, DO   15 mL at 07/27/17 1113  . mometasone-formoterol (DULERA) 200-5 MCG/ACT inhaler 2 puff  2 puff Inhalation BID Oswald Hillock, MD   2 puff at 07/27/17 0730  . ondansetron (ZOFRAN) tablet 4 mg  4 mg Oral Q6H PRN Oswald Hillock, MD       Or  . ondansetron [facility]) injection  4 mg  4 mg Intravenous Q6H PRN Oswald Hillock, MD      . oxyCODONE-acetaminophen (PERCOCET/ROXICET) 5-325 MG per tablet 1 tablet  1 tablet Oral Q4H PRN Oswald Hillock, MD   1 tablet at 07/25/17 2234  . thiamine (B-1) injection 100 mg  100 mg Intravenous Daily Phillips Odor, MD   100 mg at 07/27/17 1113     Discharge Medications: Please see discharge summary for a list of discharge medications.  Relevant Imaging Results:  Relevant Lab Results:   Additional Information SSN [id number], Clydene Pugh, LCSW

## 2017-07-27 NOTE — Progress Notes (Signed)
Subjective: He is overall about the same.  He had thoracentesis yesterday and results were incomplete.  His pleural effusion is described as complicated and multiloculated.  Objective: Vital signs in last 24 hours: Temp:  [98.2 F (36.8 C)-99.2 F (37.3 C)] 98.2 F (36.8 C) (05/15 0453) Pulse Rate:  [70-84] 84 (05/15 0453) Resp:  [18-20] 20 (05/15 0453) BP: (112-128)/(64-71) 118/67 (05/15 0453) SpO2:  [95 %-98 %] 95 % (05/15 0731) Weight change:  Last BM Date: (pt does not know)  Intake/Output from previous day: 05/14 0701 - 05/15 0700 In: 240 [P.O.:240] Out: -   PHYSICAL EXAM General appearance: alert and Slow to respond Resp: rhonchi bilaterally Cardio: regular rate and rhythm, S1, S2 normal, no murmur, click, rub or gallop GI: soft, non-tender; bowel sounds normal; no masses,  no organomegaly Extremities: extremities normal, atraumatic, no cyanosis or edema  Lab Results:  Results for orders placed or performed during the hospital encounter of 07/25/17 (from the past 48 hour(s))  CBC with Differential/Platelet     Status: Abnormal   Collection Time: 07/25/17  1:20 PM  Result Value Ref Range   WBC 7.1 4.0 - 10.5 K/uL   RBC 3.68 (L) 4.22 - 5.81 MIL/uL   Hemoglobin 9.4 (L) 13.0 - 17.0 g/dL   HCT 29.3 (L) 39.0 - 52.0 %   MCV 79.6 78.0 - 100.0 fL   MCH 25.5 (L) 26.0 - 34.0 pg   MCHC 32.1 30.0 - 36.0 g/dL   RDW 14.6 11.5 - 15.5 %   Platelets 278 150 - 400 K/uL   Neutrophils Relative % 72 %   Neutro Abs 5.1 1.7 - 7.7 K/uL   Lymphocytes Relative 21 %   Lymphs Abs 1.5 0.7 - 4.0 K/uL   Monocytes Relative 6 %   Monocytes Absolute 0.4 0.1 - 1.0 K/uL   Eosinophils Relative 1 %   Eosinophils Absolute 0.1 0.0 - 0.7 K/uL   Basophils Relative 0 %   Basophils Absolute 0.0 0.0 - 0.1 K/uL    Comment: Performed at [facility], [address] [place], [place] [address]  Comprehensive metabolic panel     Status: Abnormal   Collection Time: 07/25/17  1:20 PM  Result Value Ref Range    Sodium 136 135 - 145 mmol/L   Potassium 4.2 3.5 - 5.1 mmol/L   Chloride 99 (L) 101 - 111 mmol/L   CO2 29 22 - 32 mmol/L   Glucose, Bld 167 (H) 65 - 99 mg/dL   BUN 14 6 - 20 mg/dL   Creatinine, Ser 0.95 0.61 - 1.24 mg/dL   Calcium 8.5 (L) 8.9 - 10.3 mg/dL   Total Protein 6.8 6.5 - 8.1 g/dL   Albumin 2.6 (L) 3.5 - 5.0 g/dL   AST 23 15 - 41 U/L   ALT 20 17 - 63 U/L   Alkaline Phosphatase 55 38 - 126 U/L   Total Bilirubin 0.7 0.3 - 1.2 mg/dL   GFR calc non Af Amer >60 >60 mL/min   GFR calc Af Amer >60 >60 mL/min    Comment: (NOTE) The eGFR has been calculated using the CKD EPI equation. This calculation has not been validated in all clinical situations. eGFR's persistently <60 mL/min signify possible Chronic Kidney Disease.    Anion gap 8 5 - 15    Comment: Performed at [facility], [address], [place],  [address]  Ethanol     Status: None   Collection Time: 07/25/17  1:20 PM  Result Value  Ref Range   Alcohol, Ethyl (B) <10 <10 mg/dL    Comment:        LOWEST DETECTABLE LIMIT FOR SERUM ALCOHOL IS 10 mg/dL FOR MEDICAL PURPOSES ONLY Performed at [facility], [address] [name], [place] [address]   Hemoglobin A1c     Status: None   Collection Time: 07/25/17  1:20 PM  Result Value Ref Range   Hgb A1c MFr Bld 5.5 4.8 - 5.6 %    Comment: (NOTE)         Prediabetes: 5.7 - 6.4         Diabetes: >6.4         Glycemic control for adults with diabetes: <7.0    Mean Plasma Glucose 111 mg/dL    Comment: (NOTE) Performed At: [facility] [place], [place] [id number] Rush Farmer MD [id number] Performed at [facility] [address] [place], [place] [address]   Urinalysis, Routine w reflex microscopic     Status: Abnormal   Collection Time: 07/25/17  1:30 PM  Result Value Ref Range   Color, Urine YELLOW YELLOW   APPearance CLEAR CLEAR   Specific Gravity, Urine 1.013 1.005 - 1.030   pH 6.0 5.0 - 8.0   Glucose, UA NEGATIVE NEGATIVE  mg/dL   Hgb urine dipstick SMALL (A) NEGATIVE   Bilirubin Urine NEGATIVE NEGATIVE   Ketones, ur NEGATIVE NEGATIVE mg/dL   Protein, ur 30 (A) NEGATIVE mg/dL   Nitrite NEGATIVE NEGATIVE   Leukocytes, UA NEGATIVE NEGATIVE   RBC / HPF 11-20 0 - 5 RBC/hpf   WBC, UA 0-5 0 - 5 WBC/hpf   Bacteria, UA NONE SEEN NONE SEEN    Comment: Performed at [facility], [address] [place], [place] [address]  Rapid urine drug screen (hospital performed)     Status: None   Collection Time: 07/25/17  1:40 PM  Result Value Ref Range   Opiates NONE DETECTED NONE DETECTED   Cocaine NONE DETECTED NONE DETECTED   Benzodiazepines NONE DETECTED NONE DETECTED   Amphetamines NONE DETECTED NONE DETECTED   Tetrahydrocannabinol NONE DETECTED NONE DETECTED   Barbiturates NONE DETECTED NONE DETECTED    Comment: (NOTE) DRUG SCREEN FOR MEDICAL PURPOSES ONLY.  IF CONFIRMATION IS NEEDED FOR ANY PURPOSE, NOTIFY LAB WITHIN 5 DAYS. LOWEST DETECTABLE LIMITS FOR URINE DRUG SCREEN Drug Class                     Cutoff (ng/mL) Amphetamine and metabolites    1000 Barbiturate and metabolites    200 Benzodiazepine                 629 Tricyclics and metabolites     300 Opiates and metabolites        300 Cocaine and metabolites        300 THC                            50 Performed at [facility], [address] [place],  [address]   I-stat troponin, ED     Status: None   Collection Time: 07/25/17  1:48 PM  Result Value Ref Range   Troponin i, poc 0.00 0.00 - 0.08 ng/mL   Comment 3            Comment: Due to the release kinetics of cTnI, a negative result within the first hours of the onset of symptoms does not rule  out myocardial infarction with certainty. If myocardial infarction is still suspected, repeat the test at appropriate intervals.   Vitamin B12     Status: None   Collection Time: 07/25/17  3:59 PM  Result Value Ref Range   Vitamin B-12 244 180 - 914 pg/mL    Comment: (NOTE) This assay is not  validated for testing neonatal or myeloproliferative syndrome specimens for Vitamin B12 levels. Performed at [facility], [place] [address], [place], [place] [address]   Iron and TIBC     Status: Abnormal   Collection Time: 07/25/17  3:59 PM  Result Value Ref Range   Iron 17 (L) 45 - 182 ug/dL   TIBC 130 (L) 250 - 450 ug/dL   Saturation Ratios 13 (L) 17.9 - 39.5 %   UIBC 113 ug/dL    Comment: Performed at [facility] [address], [place], [place] [address]  Ferritin     Status: Abnormal   Collection Time: 07/25/17  3:59 PM  Result Value Ref Range   Ferritin 875 (H) 24 - 336 ng/mL    Comment: Performed at [facility], [place] [address] [place], [place] [address]  Reticulocytes     Status: Abnormal   Collection Time: 07/25/17  3:59 PM  Result Value Ref Range   Retic Ct Pct 1.7 0.4 - 3.1 %   RBC. 3.66 (L) 4.22 - 5.81 MIL/uL   Retic Count, Absolute 62.2 19.0 - 186.0 K/uL    Comment: Performed at [facility] [address] [place], [place] [address]  Folate     Status: None   Collection Time: 07/25/17  4:03 PM  Result Value Ref Range   Folate 7.3 >5.9 ng/mL    Comment: Performed at [facility], [place] [address], [place], [place] [address]  Lipid panel     Status: Abnormal   Collection Time: 07/26/17  4:38 AM  Result Value Ref Range   Cholesterol 114 0 - 200 mg/dL   Triglycerides 71 <150 mg/dL   HDL 25 (L) >40 mg/dL   Total CHOL/HDL Ratio 4.6 RATIO   VLDL 14 0 - 40 mg/dL   LDL Cholesterol 75 0 - 99 mg/dL    Comment:        Total Cholesterol/HDL:CHD Risk Coronary Heart Disease Risk Table                     Men   Women  1/2 Average Risk   3.4   3.3  Average Risk       5.0   4.4  2 X Average Risk   9.6   7.1  3 X Average Risk  23.4   11.0        Use the calculated Patient Ratio above and the CHD Risk Table to determine the patient's CHD Risk.        ATP III CLASSIFICATION (LDL):  <100     mg/dL   Optimal  100-129  mg/dL   Near or Above                     Optimal  130-159  mg/dL   Borderline  160-189  mg/dL   High  >190     mg/dL   Very High Performed at [facility] [place], [place] [address]   CBC     Status: Abnormal   Collection Time: 07/26/17  4:38 AM  Result Value Ref  Range   WBC 6.9 4.0 - 10.5 K/uL   RBC 3.53 (L) 4.22 - 5.81 MIL/uL   Hemoglobin 9.1 (L) 13.0 - 17.0 g/dL   HCT 28.1 (L) 39.0 - 52.0 %   MCV 79.6 78.0 - 100.0 fL   MCH 25.8 (L) 26.0 - 34.0 pg   MCHC 32.4 30.0 - 36.0 g/dL   RDW 14.8 11.5 - 15.5 %   Platelets 281 150 - 400 K/uL    Comment: Performed at [facility], [address], [place], [place] [address]  Comprehensive metabolic panel     Status: Abnormal   Collection Time: 07/26/17  4:38 AM  Result Value Ref Range   Sodium 136 135 - 145 mmol/L   Potassium 4.0 3.5 - 5.1 mmol/L   Chloride 99 (L) 101 - 111 mmol/L   CO2 29 22 - 32 mmol/L   Glucose, Bld 89 65 - 99 mg/dL   BUN 10 6 - 20 mg/dL   Creatinine, Ser 0.80 0.61 - 1.24 mg/dL   Calcium 8.4 (L) 8.9 - 10.3 mg/dL   Total Protein 6.6 6.5 - 8.1 g/dL   Albumin 2.5 (L) 3.5 - 5.0 g/dL   AST 17 15 - 41 U/L   ALT 19 17 - 63 U/L   Alkaline Phosphatase 52 38 - 126 U/L   Total Bilirubin 0.6 0.3 - 1.2 mg/dL   GFR calc non Af Amer >60 >60 mL/min   GFR calc Af Amer >60 >60 mL/min    Comment: (NOTE) The eGFR has been calculated using the CKD EPI equation. This calculation has not been validated in all clinical situations. eGFR's persistently <60 mL/min signify possible Chronic Kidney Disease.    Anion gap 8 5 - 15    Comment: Performed at [facility], [address], [place], [place] [address]  Body fluid cell count with differential     Status: Abnormal   Collection Time: 07/26/17  3:30 PM  Result Value Ref Range   Fluid Type-FCT RIGHT     Comment: PLEURAL CORRECTED ON 05/14 AT 1839: PREVIOUSLY REPORTED AS PLEURAL    Color, Fluid AMBER (A) YELLOW   Appearance, Fluid HAZY (A) CLEAR   WBC, Fluid UNABLE TO PERFORM COUNT DUE TO CLOT IN  SPECIMEN 0 - 1,000 cu mm   Neutrophil Count, Fluid 72 (H) 0 - 25 %   Lymphs, Fluid 27 %   Monocyte-Macrophage-Serous Fluid 1 (L) 50 - 90 %   Eos, Fluid 0 %    Comment: Performed at [facility], [address] [place], [place] [address]  Stat Gram stain     Status: None   Collection Time: 07/26/17  4:19 PM  Result Value Ref Range   Specimen Description PLEURAL RIGHT    Special Requests NONE    Gram Stain      CYTOSPIN SMEAR WBC PRESENT, PREDOMINANTLY MONONUCLEAR NO ORGANISMS SEEN Performed at [facility] Performed at  [facility], [address] [place],  [address]    Report Status 07/26/2017 FINAL   CBC     Status: Abnormal   Collection Time: 07/27/17  5:09 AM  Result Value Ref Range   WBC 7.1 4.0 - 10.5 K/uL   RBC 3.75 (L) 4.22 - 5.81 MIL/uL   Hemoglobin 9.5 (L) 13.0 - 17.0 g/dL   HCT 29.9 (L) 39.0 - 52.0 %   MCV 79.7 78.0 - 100.0 fL   MCH 25.3 (L) 26.0 - 34.0 pg   MCHC 31.8 30.0 - 36.0 g/dL  RDW 14.8 11.5 - 15.5 %   Platelets 306 150 - 400 K/uL    Comment: Performed at [facility], [address] [place], [place] [address]  Basic metabolic panel     Status: Abnormal   Collection Time: 07/27/17  5:09 AM  Result Value Ref Range   Sodium 135 135 - 145 mmol/L   Potassium 4.0 3.5 - 5.1 mmol/L   Chloride 100 (L) 101 - 111 mmol/L   CO2 29 22 - 32 mmol/L   Glucose, Bld 101 (H) 65 - 99 mg/dL   BUN 10 6 - 20 mg/dL   Creatinine, Ser 0.86 0.61 - 1.24 mg/dL   Calcium 8.5 (L) 8.9 - 10.3 mg/dL   GFR calc non Af Amer >60 >60 mL/min   GFR calc Af Amer >60 >60 mL/min    Comment: (NOTE) The eGFR has been calculated using the CKD EPI equation. This calculation has not been validated in all clinical situations. eGFR's persistently <60 mL/min signify possible Chronic Kidney Disease.    Anion gap 6 5 - 15    Comment: Performed at [facility], [address] [place], [place] [address]    ABGS No results for input(s): PHART, PO2ART, TCO2, HCO3 in the last 72  hours.  Invalid input(s): PCO2 CULTURES Recent Results (from the past 240 hour(s))  Stat Gram stain     Status: None   Collection Time: 07/26/17  4:19 PM  Result Value Ref Range Status   Specimen Description PLEURAL RIGHT  Final   Special Requests NONE  Final   Gram Stain   Final    CYTOSPIN SMEAR WBC PRESENT, PREDOMINANTLY MONONUCLEAR NO ORGANISMS SEEN Performed at [facility] Performed at [facility], [address] [place], [place] [address]    Report Status 07/26/2017 FINAL  Final   Studies/Results: Dg Chest 1 View  Result Date: 07/26/2017 CLINICAL DATA:  RIGHT pleural effusion post thoracentesis EXAM: CHEST  1 VIEW COMPARISON:  07/25/2017 FINDINGS: Normal heart size, mediastinal contours, and pulmonary vascularity. Persistent RIGHT pleural effusion with atelectasis throughout RIGHT lung. A more rounded area of opacity is seen at the mid to lower RIGHT hemithorax, question confluent atelectasis or infiltrate, without definite discrete mass by recent CT. No pneumothorax. Mild atelectasis at LEFT base. Bones demineralized. IMPRESSION: No pneumothorax following RIGHT thoracentesis. Electronically Signed   By: Lavonia Dana M.D.   On: 07/26/2017 15:56   Ct Head Wo Contrast  Result Date: 07/25/2017 CLINICAL DATA:  Mental status change today.  Combative. EXAM: CT HEAD WITHOUT CONTRAST TECHNIQUE: Contiguous axial images were obtained from the base of the skull through the vertex without intravenous contrast. COMPARISON:  None. FINDINGS: Brain: There is a moderate-sized posterior left MCA territory infarct predominantly involving the parietal lobe which is favored to be subacute. There could be minimal petechial hemorrhage, however no malignant hemorrhagic transformation is present. Minimal rightward midline shift measures 4 mm. Small infarcts involving the left caudate head and right parietal cortex are of indeterminate acuity. No extra-axial fluid collection is seen. Periventricular  white matter hypodensities are nonspecific but compatible with mild chronic small vessel ischemic disease. There is moderate cerebral atrophy. Vascular: Calcified atherosclerosis at the skull base. No hyperdense vessel. Skull: No fracture or destructive osseous lesion. Sinuses/Orbits: Minimal bilateral ethmoid air cell mucosal thickening. Trace right mastoid fluid. Unremarkable orbits. Other: None. IMPRESSION: 1. Subacute, moderate-sized posterior left MCA infarct. 2. Small age indeterminate right parietal and left caudate infarcts. 3. Mild chronic small vessel ischemic disease and  moderate cerebral atrophy. Electronically Signed   By: Logan Bores M.D.   On: 07/25/2017 15:49   Ct Chest W Contrast  Result Date: 07/25/2017 CLINICAL DATA:  Altered mental status. EXAM: CT CHEST WITH CONTRAST TECHNIQUE: Multidetector CT imaging of the chest was performed during intravenous contrast administration. CONTRAST:  60m ISOVUE-300 IOPAMIDOL (ISOVUE-300) INJECTION 61% COMPARISON:  CT scan of May 23, 2013. FINDINGS: Cardiovascular: Atherosclerosis of thoracic aorta is noted without aneurysm or dissection. Coronary calcifications are noted. No pericardial effusion is noted. Mediastinum/Nodes: Thyroid gland and esophagus are unremarkable. 1.9 cm subcarinal adenopathy is noted 1.3 cm right paratracheal lymph node is noted. These are new since prior exam. Lungs/Pleura: No pneumothorax is noted. Left lung is clear. Emphysematous disease is noted in the right upper lobe. There is interval development of probable loculated right pleural effusion. Atelectasis or scarring is noted in the right lung base. Atelectasis of right upper lobe is noted; central endobronchial obstruction cannot be excluded. Upper Abdomen: Bilateral nephrolithiasis is noted. Musculoskeletal: No chest wall abnormality. No acute or significant osseous findings. IMPRESSION: Interval development of loculated right pleural effusion, with atelectasis or scarring  noted in right lung base. Atelectasis of right upper lobe is noted. The possibility of central obstructive endobronchial lesion cannot be excluded, and further evaluation with bronchoscopy is recommended. Interval development of right paratracheal and subcarinal adenopathy is noted; etiology is unknown, but metastatic disease cannot be excluded. Coronary artery calcifications are noted suggesting coronary artery disease. Bilateral nephrolithiasis. Aortic Atherosclerosis (ICD10-I70.0) and Emphysema (ICD10-J43.9). Electronically Signed   By: JMarijo Conception M.D.   On: 07/25/2017 15:52   Mr MJodene NamHead Wo Contrast  Result Date: 07/26/2017 CLINICAL DATA:  72year old male with altered mental status for 2 days. Subacute appearing posterior left MCA and other scattered age indeterminate infarcts on head CT yesterday. EXAM: MRI HEAD WITHOUT CONTRAST MRA HEAD WITHOUT CONTRAST TECHNIQUE: Multiplanar, multiecho pulse sequences of the brain and surrounding structures were obtained without intravenous contrast. Angiographic images of the head were obtained using MRA technique without contrast. COMPARISON:  Head CT 07/25/2017.  Cervical spine MRI 08/17/2011. FINDINGS: MRI HEAD FINDINGS Study is intermittently degraded by motion artifact despite repeated imaging attempts. Brain: Confluent restricted diffusion in the left parietal lobe affecting cortex and white matter in an area of 5 centimeters (series 6, image 41), corresponding to the most confluent area of hypodensity in the posterior left hemisphere on CT yesterday. Ischemia tracks into the left occipital lobe corresponding to the left MCA/PCA watershed territory. Multifocal scattered additional cortical and subcortical white matter restricted diffusion elsewhere in the left MCA territory, including some of the anterior division (series 4, image 98). T2 and FLAIR hyperintensity in keeping with cytotoxic edema in the affected areas. No associated hemorrhage is evident.  There is mild regional mass effect, including on the atrium of the left lateral ventricle. There is a solitary linear area of restricted diffusion in the contralateral right hemisphere affecting white matter in the inferior right parietal lobe seen on series 4, image 90. No associated hemorrhage or mass effect. No posterior fossa or pure posterior circulation restricted diffusion. Chronic lacunar infarcts in the left basal ganglia and left thalamus. The brainstem and cerebellum appear relatively spared. There is a small area of chronic cortical encephalomalacia in the right parietal lobe (series 9, image 33, which corresponds to the age indeterminate area seen by CT (#2 yesterday). No midline shift, evidence of mass lesion, ventriculomegaly, extra-axial collection or acute intracranial hemorrhage. Cervicomedullary junction and pituitary  are within normal limits. Vascular: Major intracranial vascular flow voids are preserved. Skull and upper cervical spine: Negative visible cervical spine. Sinuses/Orbits: Grossly normal orbits soft tissues. Mild paranasal sinus mucosal thickening. Other: Trace right mastoid fluid. Grossly negative visible internal auditory structures. Negative scalp and face soft tissues. MRA HEAD FINDINGS Intermittently degraded by motion. Antegrade flow in the posterior circulation including codominant distal vertebral arteries. No distal vertebral stenosis. Patent PICA origins. Patent vertebrobasilar junction. No basilar stenosis. Tortuous basilar artery. Patent SCA and PCA origins. Symmetric bilateral PCA flow signal. Posterior communicating arteries are diminutive or absent. Antegrade flow in both ICA siphons. Bilateral siphon irregularity, no high-grade siphon stenosis suspected. Ophthalmic artery origins appear normal. Patent carotid termini. The right ACA A1 segment appears dominant and the left diminutive. The anterior communicating artery and visible ACA branches appear within normal  limits. Both MCA M1 segments are patent. Motion artifact obscures detail of the bilateral MCA bifurcations. There does appear to be asymmetrically decreased flow signal in the posterior left MCA territory. IMPRESSION: 1. Acute infarcts in the Left MCA territory, most pronounced in the posterior division which corresponds to the CT abnormality yesterday. Cytotoxic edema with minimal mass effect. No associated hemorrhage. 2. Superimposed small acute white matter infarct in the posterior right MCA territory. Given the absence of other vascular territory infarcts this may constellation reflect synchronous small vessel disease rather than a recent embolic event from the heart or proximal aorta. 3. Motion degraded MRA is negative for large vessel occlusion. However, stenosis or occlusion is suspected in at least some posterior left MCA branches. 4. Chronic small and medium-sized vessel ischemia in the left deep gray matter nuclei and right parietal lobe. Electronically Signed   By: Genevie Ann M.D.   On: 07/26/2017 08:25   Mr Brain Wo Contrast  Result Date: 07/26/2017 CLINICAL DATA:  72 year old male with altered mental status for 2 days. Subacute appearing posterior left MCA and other scattered age indeterminate infarcts on head CT yesterday. EXAM: MRI HEAD WITHOUT CONTRAST MRA HEAD WITHOUT CONTRAST TECHNIQUE: Multiplanar, multiecho pulse sequences of the brain and surrounding structures were obtained without intravenous contrast. Angiographic images of the head were obtained using MRA technique without contrast. COMPARISON:  Head CT 07/25/2017.  Cervical spine MRI 08/17/2011. FINDINGS: MRI HEAD FINDINGS Study is intermittently degraded by motion artifact despite repeated imaging attempts. Brain: Confluent restricted diffusion in the left parietal lobe affecting cortex and white matter in an area of 5 centimeters (series 6, image 41), corresponding to the most confluent area of hypodensity in the posterior left  hemisphere on CT yesterday. Ischemia tracks into the left occipital lobe corresponding to the left MCA/PCA watershed territory. Multifocal scattered additional cortical and subcortical white matter restricted diffusion elsewhere in the left MCA territory, including some of the anterior division (series 4, image 98). T2 and FLAIR hyperintensity in keeping with cytotoxic edema in the affected areas. No associated hemorrhage is evident. There is mild regional mass effect, including on the atrium of the left lateral ventricle. There is a solitary linear area of restricted diffusion in the contralateral right hemisphere affecting white matter in the inferior right parietal lobe seen on series 4, image 90. No associated hemorrhage or mass effect. No posterior fossa or pure posterior circulation restricted diffusion. Chronic lacunar infarcts in the left basal ganglia and left thalamus. The brainstem and cerebellum appear relatively spared. There is a small area of chronic cortical encephalomalacia in the right parietal lobe (series 9, image 33, which corresponds to  the age indeterminate area seen by CT (#2 yesterday). No midline shift, evidence of mass lesion, ventriculomegaly, extra-axial collection or acute intracranial hemorrhage. Cervicomedullary junction and pituitary are within normal limits. Vascular: Major intracranial vascular flow voids are preserved. Skull and upper cervical spine: Negative visible cervical spine. Sinuses/Orbits: Grossly normal orbits soft tissues. Mild paranasal sinus mucosal thickening. Other: Trace right mastoid fluid. Grossly negative visible internal auditory structures. Negative scalp and face soft tissues. MRA HEAD FINDINGS Intermittently degraded by motion. Antegrade flow in the posterior circulation including codominant distal vertebral arteries. No distal vertebral stenosis. Patent PICA origins. Patent vertebrobasilar junction. No basilar stenosis. Tortuous basilar artery. Patent SCA  and PCA origins. Symmetric bilateral PCA flow signal. Posterior communicating arteries are diminutive or absent. Antegrade flow in both ICA siphons. Bilateral siphon irregularity, no high-grade siphon stenosis suspected. Ophthalmic artery origins appear normal. Patent carotid termini. The right ACA A1 segment appears dominant and the left diminutive. The anterior communicating artery and visible ACA branches appear within normal limits. Both MCA M1 segments are patent. Motion artifact obscures detail of the bilateral MCA bifurcations. There does appear to be asymmetrically decreased flow signal in the posterior left MCA territory. IMPRESSION: 1. Acute infarcts in the Left MCA territory, most pronounced in the posterior division which corresponds to the CT abnormality yesterday. Cytotoxic edema with minimal mass effect. No associated hemorrhage. 2. Superimposed small acute white matter infarct in the posterior right MCA territory. Given the absence of other vascular territory infarcts this may constellation reflect synchronous small vessel disease rather than a recent embolic event from the heart or proximal aorta. 3. Motion degraded MRA is negative for large vessel occlusion. However, stenosis or occlusion is suspected in at least some posterior left MCA branches. 4. Chronic small and medium-sized vessel ischemia in the left deep gray matter nuclei and right parietal lobe. Electronically Signed   By: Genevie Ann M.D.   On: 07/26/2017 08:25   US Carotid Bilateral  Result Date: 07/26/2017 CLINICAL DATA:  Left MCA acute strokes EXAM: BILATERAL CAROTID DUPLEX ULTRASOUND TECHNIQUE: Pearline Cables scale imaging, color Doppler and duplex ultrasound were performed of bilateral carotid and vertebral arteries in the neck. COMPARISON:  07/26/2017 MRI FINDINGS: Criteria: Quantification of carotid stenosis is based on velocity parameters that correlate the residual internal carotid diameter with NASCET-based stenosis levels, using the  diameter of the distal internal carotid lumen as the denominator for stenosis measurement. The following velocity measurements were obtained: RIGHT ICA: 108/32 cm/sec CCA: 63/33 cm/sec SYSTOLIC ICA/CCA RATIO:  1.1 ECA:  104 cm/sec LEFT ICA: 193/20 cm/sec CCA: 54/56 cm/sec SYSTOLIC ICA/CCA RATIO:  4.0 ECA:  69 cm/sec RIGHT CAROTID ARTERY: Minor echogenic shadowing plaque formation. No hemodynamically significant right ICA stenosis, velocity elevation, or turbulent flow. Degree of narrowing less than 50%. RIGHT VERTEBRAL ARTERY:  Antegrade LEFT CAROTID ARTERY: Moderate to severe heterogeneous calcified bifurcation atherosclerosis. This results in luminal narrowing by grayscale imaging. Proximal left ICA velocity elevation and turbulent flow noted. Left proximal ICA velocity measures 193/25 centimeters/second. By ultrasound criteria left ICA stenosis estimated at 50-69%. LEFT VERTEBRAL ARTERY:  Antegrade IMPRESSION: Left greater than right carotid atherosclerosis. Left ICA stenosis estimated at 50-69% by ultrasound criteria Right ICA narrowing less than 50% Patent antegrade vertebral flow bilaterally Electronically Signed   By: Jerilynn Mages.  Shick M.D.   On: 07/26/2017 09:15   Dg Chest Port 1 View  Result Date: 07/25/2017 CLINICAL DATA:  Confusion and weakness EXAM: PORTABLE CHEST 1 VIEW COMPARISON:  05/23/2013 FINDINGS: Cardiac shadow is  within normal limits. Increasing effusion and likely underlying infiltrate is noted on the right. The left lung remains clear. No bony abnormality is noted. IMPRESSION: Increasing opacity within the right hemithorax consistent with infiltrate and underlying effusion. Electronically Signed   By: Inez Catalina M.D.   On: 07/25/2017 14:09   US Thoracentesis Asp Pleural Space W/img Guide  Result Date: 07/26/2017 INDICATION: RIGHT pleural effusion, for diagnostic and therapeutic thoracentesis EXAM: ULTRASOUND GUIDED DIAGNOSTIC RIGHT THORACENTESIS MEDICATIONS: None. COMPLICATIONS: None  immediate. PROCEDURE: Procedure, benefits, and risks of procedure were discussed with patient. Written informed consent for procedure was obtained. Time out protocol followed. Pleural effusion localized by ultrasound at the posterior RIGHT hemithorax. RIGHT pleural effusion is markedly loculated and highly complex in appearance. Skin prepped and draped in usual sterile fashion. Skin and soft tissues anesthetized with 10 mL of 1% lidocaine. 8 French thoracentesis catheter placed into the RIGHT pleural space. Only 23 mL of slightly cloudy RIGHT pleural fluid was able to be aspirated by syringe. Procedure tolerated well by patient without immediate complication. FINDINGS: Only 23 mL of RIGHT pleural fluid could be removed. Entire sample was sent to the laboratory as requested by the clinical team. IMPRESSION: Successful ultrasound guided RIGHT thoracentesis yielding 23 mL of pleural fluid. Electronically Signed   By: Lavonia Dana M.D.   On: 07/26/2017 16:02    Medications:  Prior to Admission:  No medications prior to admission.   Scheduled: . aspirin  325 mg Oral Daily  . atorvastatin  40 mg Oral q1800  . enoxaparin (LOVENOX) injection  40 mg Subcutaneous Q24H  . lisinopril  10 mg Oral Daily  . mouth rinse  15 mL Mouth Rinse BID  . mometasone-formoterol  2 puff Inhalation BID  . thiamine injection  100 mg Intravenous Daily   Continuous: . sodium chloride 10 mL/hr at 07/25/17 2233   UJW:[id number], ondansetron **OR** ondansetron (ZOFRAN) IV, oxyCODONE-acetaminophen  Assesment: He was admitted with an acute stroke.  He has a pleural effusion and abnormal CT with what looks like atelectasis in the area of the right upper lobe.  His pleural effusion is described as complex.  Results of his thoracentesis are pending.  He may need thoracic surgery consultation and perhaps a VATS procedure but timing of that questionable considering an acute stroke.  He has multiple other problems and I cannot  get a sense from him whether he would agree to treatment for lung cancer if he had that. Active Problems:   COPD (chronic obstructive pulmonary disease) (HCC)   Lung mass   Pleural effusion    Plan: I will discuss with thoracic surgery today.    LOS: 2 days   Ahad Colarusso L 07/27/2017, 9:06 AM

## 2017-07-28 DIAGNOSIS — I639 Cerebral infarction, unspecified: Secondary | ICD-10-CM

## 2017-07-28 DIAGNOSIS — J90 Pleural effusion, not elsewhere classified: Secondary | ICD-10-CM

## 2017-07-28 DIAGNOSIS — R918 Other nonspecific abnormal finding of lung field: Secondary | ICD-10-CM

## 2017-07-28 DIAGNOSIS — J449 Chronic obstructive pulmonary disease, unspecified: Secondary | ICD-10-CM

## 2017-07-28 DIAGNOSIS — Z515 Encounter for palliative care: Secondary | ICD-10-CM

## 2017-07-28 NOTE — Progress Notes (Signed)
PMT meeting scheduled with [name] at 11:00 today.  Florentina Jenny, PA-C Palliative Medicine Pager: [phone]  No charge note

## 2017-07-28 NOTE — Progress Notes (Signed)
Occupational Therapy Treatment Patient Details Name: Melvin Hart MRN: [id number] DOB: Jun 19, 1945 Today's Date: 07/28/2017    History of present illness Melvin Hart  is a 72 y.o. male, with history of COPD, hypertension was brought to hospital with complaints of confusion.  There is no family at bedside, history obtained from ED notes.  Patient apparently lives with his brother and brother called EMS stating that he was confused. MRI positive for CVA   OT comments  Pt agreeable to OT treatment session, oriented only to self and confused as to why he is here and how he got here. Pt requiring step by step verbal and visual cuing for grooming tasks today, often stating "show me where" or "show me how." Pt demonstrates good fine motor coordination during ADLs this am. Does not attend to proper use of tools (see ADL section below for details). Pt will require 24/7 supervision on discharge, continue to recommend SNF for improved safety and independence in ADL completion.    Follow Up Recommendations  SNF;Supervision/Assistance - 24 hour    Equipment Recommendations  None recommended by OT       Precautions / Restrictions Precautions Precautions: Fall       Mobility Bed Mobility Overal bed mobility: Needs Assistance Bed Mobility: Supine to Sit     Supine to sit: Supervision        Transfers Overall transfer level: Needs assistance Equipment used: Rolling walker (2 wheeled) Transfers: Sit to/from Stand Sit to Stand: Min guard                  ADL either performed or assessed with clinical judgement   ADL Overall ADL's : Needs assistance/impaired     Grooming: Wash/dry hands;Wash/dry face;Brushing hair;Supervision/safety;Cueing for sequencing;Standing Grooming Details (indicate cue type and reason): Pt able to tolerate standing to perform tasks. Requires step by step verbal cuing for sequencing. When brushing hair pt more attentive to left side, does not pay  attention to comb and uses comb backwards at times, cuing to attend to right side of head. OT also visually guiding pt to soap and paper towels         Upper Body Dressing : Minimal assistance;Sitting Upper Body Dressing Details (indicate cue type and reason): Assist for locating arm holes and threading arms.  Lower Body Dressing: Supervision/safety;Sitting/lateral leans               Functional mobility during ADLs: Min guard;Rolling walker       Cognition Arousal/Alertness: Awake/alert Behavior During Therapy: WFL for tasks assessed/performed Overall Cognitive Status: Impaired/Different from baseline Area of Impairment: Orientation;Memory                 Orientation Level: Disoriented to;Place;Time;Situation   Memory: Decreased short-term memory         General Comments: confused, asking how he got to hospital and why he is here                   Pertinent Vitals/ Pain       Pain Assessment: No/denies pain         Frequency  Min 2X/week        Progress Toward Goals  OT Goals(current goals can now be found in the care plan section)     Acute Rehab OT Goals Patient Stated Goal: return home OT Goal Formulation: Patient unable to participate in goal setting Time For Goal Achievement: 08/10/17 Potential to Achieve Goals: Good ADL Goals Pt Will Perform  Grooming: with modified independence;standing Pt Will Perform Upper Body Dressing: with modified independence;sitting Pt Will Transfer to Toilet: with modified independence;regular height toilet;ambulating Pt Will Perform Toileting - Clothing Manipulation and hygiene: with modified independence;sitting/lateral leans;sit to/from stand Pt/caregiver will Perform Home Exercise Program: Increased strength;Both right and left upper extremity;With Supervision;With written HEP provided  Plan Discharge plan remains appropriate          End of Session Equipment Utilized During Treatment: Gait  belt;Rolling walker  OT Visit Diagnosis: Muscle weakness (generalized) (M62.81);Other symptoms and signs involving cognitive function   Activity Tolerance Patient tolerated treatment well   Patient Left in chair;with call bell/phone within reach;with chair alarm set           Time: [id number] OT Time Calculation (min): 22 min  Charges: OT General Charges $OT Visit: 1 Visit OT Treatments $Self Care/Home Management : 8-22 mins    Guadelupe Sabin, OTR/L  [phone] 07/28/2017, 8:19 AM

## 2017-07-28 NOTE — Clinical Social Work Note (Signed)
LCSW contacted brother, Enis Slipper, who indicated that he and his family would be at the hospital to discuss discharge plans and that he would get back with LCSW.     Chaney Maclaren, Clydene Pugh, LCSW

## 2017-07-28 NOTE — Progress Notes (Signed)
Physical Therapy Treatment Patient Details Name: Melvin Hart MRN: [id number] DOB: 1946/03/15 Today's Date: 07/28/2017    History of Present Illness Melvin Hart  is a 72 y.o. male, with history of COPD, hypertension was brought to hospital with complaints of confusion.  There is no family at bedside, history obtained from ED notes.  Patient apparently lives with his brother and brother called EMS stating that he was confused. MRI positive for CVA    PT Comments    Patient demonstrates slight improvement for following directions and able to complete BLE strengthening exercises while seated at bedside with frequent demonstration and verbal cueing.  Patient continues to lean on nearby objects for support when standing without an AD, limited for gait secondary to c/o severe dizziness and tolerated sitting up in chair after therapy - patient's brother present during treatment.  Patient will benefit from continued physical therapy in hospital and recommended venue below to increase strength, balance, endurance for safe ADLs and gait.    Follow Up Recommendations  SNF;Supervision/Assistance - 24 hour     Equipment Recommendations  Rolling walker with 5" wheels    Recommendations for Other Services       Precautions / Restrictions Precautions Precautions: Fall Restrictions Weight Bearing Restrictions: No    Mobility  Bed Mobility Overal bed mobility: Needs Assistance Bed Mobility: Supine to Sit;Sit to Supine     Supine to sit: Supervision Sit to supine: Supervision   General bed mobility comments: slow labored movement  Transfers Overall transfer level: Needs assistance Equipment used: Standard walker;None Transfers: Sit to/from [facility] Sit to Stand: Min assist Stand pivot transfers: Min guard       General transfer comment: Min assist for sit to stands, transfers when not using an AD, tends to lean on wall, nearby objects for  support  Ambulation/Gait Ambulation/Gait assistance: Min assist Ambulation Distance (Feet): 35 Feet Assistive device: Rolling walker (2 wheeled) Gait Pattern/deviations: Decreased step length - right;Decreased step length - left;Decreased stride length Gait velocity: slow   General Gait Details: slow unsteady cadence with fair/good return for using RW, limited mostly due to c/o dizziness   Stairs             Wheelchair Mobility    Modified Rankin (Stroke Patients Only)       Balance   Sitting-balance support: Feet supported;No upper extremity supported Sitting balance-Leahy Scale: Good     Standing balance support: No upper extremity supported;During functional activity Standing balance-Leahy Scale: Poor Standing balance comment: fair using RW                            Cognition Arousal/Alertness: Awake/alert Behavior During Therapy: WFL for tasks assessed/performed Overall Cognitive Status: Impaired/Different from baseline Area of Impairment: Memory;Orientation                 Orientation Level: Person             General Comments: poor memory, requires frequent repeated verbal cues to complete tasks      Exercises General Exercises - Lower Extremity Ankle Circles/Pumps: Seated;AROM;Strengthening;Both;10 reps Long Arc Quad: Seated;AROM;Strengthening;Both;10 reps Hip Flexion/Marching: Seated;AROM;Strengthening;Both;10 reps    General Comments        Pertinent Vitals/Pain Pain Assessment: No/denies pain    Home Living                      Prior Function  PT Goals (current goals can now be found in the care plan section) Acute Rehab PT Goals Patient Stated Goal: return home PT Goal Formulation: With patient/family Time For Goal Achievement: 08/10/17 Potential to Achieve Goals: Good Progress towards PT goals: Progressing toward goals    Frequency    7X/week      PT Plan Current plan remains  appropriate    Co-evaluation              AM-PAC PT "6 Clicks" Daily Activity  Outcome Measure  Difficulty turning over in bed (including adjusting bedclothes, sheets and blankets)?: None Difficulty moving from lying on back to sitting on the side of the bed? : None   Help needed moving to and from a bed to chair (including a wheelchair)?: A Little Help needed walking in hospital room?: A Little Help needed climbing 3-5 steps with a railing? : A Little 6 Click Score: 17    End of Session Equipment Utilized During Treatment: Gait belt Activity Tolerance: Patient limited by fatigue(Patient limited by dizziness) Patient left: in chair;with call bell/phone within reach;with chair alarm set;with family/visitor present Nurse Communication: Mobility status;Other (comment)(NT notified that patient left up in chair) PT Visit Diagnosis: Unsteadiness on feet (R26.81);Other abnormalities of gait and mobility (R26.89);Muscle weakness (generalized) (M62.81)     Time: 8875-7972 PT Time Calculation (min) (ACUTE ONLY): 28 min  Charges:  $Therapeutic Exercise: 8-22 mins $Therapeutic Activity: 8-22 mins                    G Codes:       2:32 PM, 08-03-17 Melvin Hart, MPT Physical Therapist with [facility] [phone] office [phone] mobile phone

## 2017-07-28 NOTE — Progress Notes (Signed)
PROGRESS NOTE    Melvin Hart  FHL:[id number] DOB: 1945/07/06 DOA: 07/25/2017 PCP: Henreitta Cea, MD     Brief Narrative:  72 year old man admitted from home on 5/13 due to confusion.  He has a history of COPD and hypertension.  CT of the head showed a subacute left MCA CVA, CT scan of the chest showed a loculated right pleural effusion and admission has been requested.   Assessment & Plan:   Active Problems:   COPD (chronic obstructive pulmonary disease) (HCC)   Lung mass   Pleural effusion   Cerebrovascular accident (CVA) ([place])   Palliative care encounter   Acute metabolic encephalopathy -Secondary to subacute left MCA CVA. -Seen by neurology who is recommending a 30-day event monitor due to a simultaneous right frontal infarct which makes this worrisome for an embolic etiology. -Continue full dose aspirin for secondary stroke prevention. -LDL is 75, continue Lipitor. -2D echo: Ejection fraction of 60 to 65% with indeterminate diastolic function, normal wall motion.  Small apical lateral pericardial effusion with no tamponade physiology. -Carotid Dopplers: Left ICA stenosis 50 to 69%, right ICA stenosis less than 50%.  Antegrade vertebral flow bilaterally. -Seen by PT, will likely need short-term rehab. -Seen by speech therapy was recommended a dysphagia 3 diet with thin liquids.  Right loculated pleural effusion -Attempted thoracentesis on 5/14 which was unsuccessful due to loculation. -Seen by Dr. Luan Pulling, next step would be to refer to CT surgery for consideration of VATS.  Unclear if he would be a good candidate in light of his recent CVA with severe deficits.  We will need to further discuss with family scope of care.  To this effect we will also request palliative evaluation. -Possibility of central obstructive endobronchial lesion is not excluded on the CT, there is also subcarinal and paratracheal adenopathy with unknown etiology but metastatic disease cannot be  ruled out.  COPD -Stable, not exacerbated at present.   DVT prophylaxis: Lovenox Code Status: Full code Family Communication: Patient only Disposition Plan: SNF  Consultants:   Neurology  Pulmonology  Procedures:   As above  Antimicrobials:  Anti-infectives (From admission, onward)   None       Subjective: Lying in bed, makes eye contact, speech is difficult to understand.  Objective: Vitals:   07/27/17 2108 07/28/17 0455 07/28/17 0725 07/28/17 1417  BP: 124/61 115/67  100/63  Pulse: 67 69  70  Resp:    18  Temp: 99 F (37.2 C) 98.3 F (36.8 C)  98.7 F (37.1 C)  TempSrc: Oral Oral  Oral  SpO2: 96% 99% 96% 97%  Weight:      Height:        Intake/Output Summary (Last 24 hours) at 07/28/2017 1548 Last data filed at 07/28/2017 0600 Gross per 24 hour  Intake 480 ml  Output -  Net 480 ml   Filed Weights   07/25/17 1310  Weight: 81.6 kg (180 lb)    Examination:  General exam: Awake, unable to fully assess orientation given garbled speech Respiratory system: Decreased breath sounds at the right base. Cardiovascular system:RRR. No murmurs, rubs, gallops. Gastrointestinal system: Abdomen is nondistended, soft and nontender. No organomegaly or masses felt. Normal bowel sounds heard. Central nervous system: Awake,  moves all 4 spontaneously Extremities: No C/C/E, +pedal pulses Skin: No rashes, lesions or ulcers Psychiatry: Mood & affect appropriate.      Data Reviewed: I have personally reviewed following labs and imaging studies  CBC: Recent Labs  Lab 07/25/17 1320  07/26/17 0438 07/27/17 0509  WBC 7.1 6.9 7.1  NEUTROABS 5.1  --   --   HGB 9.4* 9.1* 9.5*  HCT 29.3* 28.1* 29.9*  MCV 79.6 79.6 79.7  PLT 278 281 578   Basic Metabolic Panel: Recent Labs  Lab 07/25/17 1320 07/26/17 0438 07/27/17 0509  NA 136 136 135  K 4.2 4.0 4.0  CL 99* 99* 100*  CO2 29 29 29   GLUCOSE 167* 89 101*  BUN 14 10 10   CREATININE 0.95 0.80 0.86  CALCIUM  8.5* 8.4* 8.5*   GFR: Estimated Creatinine Clearance: 77.3 mL/min (by C-G formula based on SCr of 0.86 mg/dL). Liver Function Tests: Recent Labs  Lab 07/25/17 1320 07/26/17 0438  AST 23 17  ALT 20 19  ALKPHOS 55 52  BILITOT 0.7 0.6  PROT 6.8 6.6  ALBUMIN 2.6* 2.5*   No results for input(s): LIPASE, AMYLASE in the last 168 hours. No results for input(s): AMMONIA in the last 168 hours. Coagulation Profile: No results for input(s): INR, PROTIME in the last 168 hours. Cardiac Enzymes: No results for input(s): CKTOTAL, CKMB, CKMBINDEX, TROPONINI in the last 168 hours. BNP (last 3 results) No results for input(s): PROBNP in the last 8760 hours. HbA1C: No results for input(s): HGBA1C in the last 72 hours. CBG: No results for input(s): GLUCAP in the last 168 hours. Lipid Profile: Recent Labs    07/26/17 0438  CHOL 114  HDL 25*  LDLCALC 75  TRIG 71  CHOLHDL 4.6   Thyroid Function Tests: No results for input(s): TSH, T4TOTAL, FREET4, T3FREE, THYROIDAB in the last 72 hours. Anemia Panel: Recent Labs    07/25/17 1559 07/25/17 1603  VITAMINB12 244  --   FOLATE  --  7.3  FERRITIN 875*  --   TIBC 130*  --   IRON 17*  --   RETICCTPCT 1.7  --    Urine analysis:    Component Value Date/Time   COLORURINE YELLOW 07/25/2017 1330   APPEARANCEUR CLEAR 07/25/2017 1330   LABSPEC 1.013 07/25/2017 1330   PHURINE 6.0 07/25/2017 1330   GLUCOSEU NEGATIVE 07/25/2017 1330   HGBUR SMALL (A) 07/25/2017 1330   BILIRUBINUR NEGATIVE 07/25/2017 1330   KETONESUR NEGATIVE 07/25/2017 1330   PROTEINUR 30 (A) 07/25/2017 1330   NITRITE NEGATIVE 07/25/2017 1330   LEUKOCYTESUR NEGATIVE 07/25/2017 1330   Sepsis Labs: @LABRCNTIP (procalcitonin:4,lacticidven:4)  ) Recent Results (from the past 240 hour(s))  Stat Gram stain     Status: None   Collection Time: 07/26/17  4:19 PM  Result Value Ref Range Status   Specimen Description PLEURAL RIGHT  Final   Special Requests NONE  Final   Gram  Stain   Final    CYTOSPIN SMEAR WBC PRESENT, PREDOMINANTLY MONONUCLEAR NO ORGANISMS SEEN Performed at [facility] Performed at [facility], [address], [place], [place] [address]    Report Status 07/26/2017 FINAL  Final         Radiology Studies: Dg Chest 1 View  Result Date: 07/26/2017 CLINICAL DATA:  RIGHT pleural effusion post thoracentesis EXAM: CHEST  1 VIEW COMPARISON:  07/25/2017 FINDINGS: Normal heart size, mediastinal contours, and pulmonary vascularity. Persistent RIGHT pleural effusion with atelectasis throughout RIGHT lung. A more rounded area of opacity is seen at the mid to lower RIGHT hemithorax, question confluent atelectasis or infiltrate, without definite discrete mass by recent CT. No pneumothorax. Mild atelectasis at LEFT base. Bones demineralized. IMPRESSION: No pneumothorax following RIGHT thoracentesis. Electronically Signed   By: Elta Guadeloupe  Thornton Papas M.D.   On: 07/26/2017 15:56        Scheduled Meds: . aspirin  325 mg Oral Daily  . atorvastatin  40 mg Oral q1800  . enoxaparin (LOVENOX) injection  40 mg Subcutaneous Q24H  . lisinopril  10 mg Oral Daily  . mouth rinse  15 mL Mouth Rinse BID  . mometasone-formoterol  2 puff Inhalation BID  . thiamine injection  100 mg Intravenous Daily   Continuous Infusions: . sodium chloride 10 mL/hr at 07/25/17 2233     LOS: 3 days    Time spent: 25 minutes.     Lelon Frohlich, MD [facility] Pager [phone]  If 7PM-7AM, please contact night-coverage [email or web address] Password [facility] 07/28/2017, 3:48 PM

## 2017-07-28 NOTE — Clinical Social Work Note (Signed)
Late entry for 07/27/17 Clinical Social Work Assessment  Patient Details  Name: Melvin Hart MRN: [id number] Date of Birth: 12-30-1945  Date of referral:  07/27/17               Reason for consult:  Facility Placement                Permission sought to share information with:    Permission granted to share information::     Name::        Agency::     Relationship::     Contact Information:  Melvin Hart, brother  Housing/Transportation Living arrangements for the past 2 months:  Single Family Home Source of Information:  Siblings Patient Interpreter Needed:  None Criminal Activity/Legal Involvement Pertinent to Current Situation/Hospitalization:  No - Comment as needed Significant Relationships:  Siblings, Other Family Members Lives with:  Siblings, Other (Comment)(cousin, brother's girlfriend) Do you feel safe going back to the place where you live?  Yes Need for family participation in patient care:  Yes (Comment)  Care giving concerns: None identified at baseline.    Social Worker assessment / plan:  Patient's brother, Melvin Hart, provided history. Patient's brother, brother's girlfriend and cousin live with patient. At baseline he ambulates independently and completes ADLs unassisted. He states that patient does not bathe and that this is common with people with dementia. LCSW discussed with Melvin PT's STR SNF recommendation. Melvin stated that he would speak to the rest of the family and contact LCSW with decision. LCSW emailed Melvin a SNF list.   Employment status:  Retired Nurse, adult PT Recommendations:  [place] / Referral to community resources:  [place]  Patient/Family's Response to care:  Family is considering STR at [facility].   Patient/Family's Understanding of and Emotional Response to Diagnosis, Current Treatment, and Prognosis:  Family understands patient's diagnosis, treatment and prognosis and feel  patient can be best served at [facility].   Emotional Assessment Appearance:  Appears stated age Attitude/Demeanor/Rapport:    Affect (typically observed):  Unable to Assess Orientation:  Oriented to Self Alcohol / Substance use:  Not Applicable Psych involvement (Current and /or in the community):  No (Comment)  Discharge Needs  Concerns to be addressed:  Discharge Planning Concerns Readmission within the last 30 days:  No Current discharge risk:  None Barriers to Discharge:  Insurance Authorization   Melvin Gully, LCSW 07/28/2017, 9:01 AM

## 2017-07-28 NOTE — Progress Notes (Signed)
Subjective: Palliative note seen and appreciated.  Agree DNR status seems appropriate.  Objective: Vital signs in last 24 hours: Temp:  [98.3 F (36.8 C)-99 F (37.2 C)] 98.7 F (37.1 C) (05/16 1417) Pulse Rate:  [67-70] 70 (05/16 1417) Resp:  [18] 18 (05/16 1417) BP: (100-124)/(61-67) 100/63 (05/16 1417) SpO2:  [93 %-99 %] 97 % (05/16 1417) Weight change:  Last BM Date: (pt does not know)  Intake/Output from previous day: 05/15 0701 - 05/16 0700 In: 1080 [P.O.:1080] Out: -   PHYSICAL EXAM General appearance: He is more awake.  Still confused Resp: rhonchi bilaterally Cardio: regular rate and rhythm, S1, S2 normal, no murmur, click, rub or gallop GI: soft, non-tender; bowel sounds normal; no masses,  no organomegaly Extremities: extremities normal, atraumatic, no cyanosis or edema  Lab Results:  Results for orders placed or performed during the hospital encounter of 07/25/17 (from the past 48 hour(s))  Body fluid cell count with differential     Status: Abnormal   Collection Time: 07/26/17  3:30 PM  Result Value Ref Range   Fluid Type-FCT RIGHT     Comment: PLEURAL CORRECTED ON 05/14 AT 1839: PREVIOUSLY REPORTED AS PLEURAL    Color, Fluid AMBER (A) YELLOW   Appearance, Fluid HAZY (A) CLEAR   WBC, Fluid UNABLE TO PERFORM COUNT DUE TO CLOT IN SPECIMEN 0 - 1,000 cu mm   Neutrophil Count, Fluid 72 (H) 0 - 25 %   Lymphs, Fluid 27 %   Monocyte-Macrophage-Serous Fluid 1 (L) 50 - 90 %   Eos, Fluid 0 %    Comment: Performed at [facility], [address], [place], [place] [address]  Stat Gram stain     Status: None   Collection Time: 07/26/17  4:19 PM  Result Value Ref Range   Specimen Description PLEURAL RIGHT    Special Requests NONE    Gram Stain      CYTOSPIN SMEAR WBC PRESENT, PREDOMINANTLY MONONUCLEAR NO ORGANISMS SEEN Performed at [facility] Performed at [facility], [address] [place], [place] [address]    Report Status 07/26/2017 FINAL   CBC      Status: Abnormal   Collection Time: 07/27/17  5:09 AM  Result Value Ref Range   WBC 7.1 4.0 - 10.5 K/uL   RBC 3.75 (L) 4.22 - 5.81 MIL/uL   Hemoglobin 9.5 (L) 13.0 - 17.0 g/dL   HCT 29.9 (L) 39.0 - 52.0 %   MCV 79.7 78.0 - 100.0 fL   MCH 25.3 (L) 26.0 - 34.0 pg   MCHC 31.8 30.0 - 36.0 g/dL   RDW 14.8 11.5 - 15.5 %   Platelets 306 150 - 400 K/uL    Comment: Performed at [facility], [address] [place], [place] [address]  Basic metabolic panel     Status: Abnormal   Collection Time: 07/27/17  5:09 AM  Result Value Ref Range   Sodium 135 135 - 145 mmol/L   Potassium 4.0 3.5 - 5.1 mmol/L   Chloride 100 (L) 101 - 111 mmol/L   CO2 29 22 - 32 mmol/L   Glucose, Bld 101 (H) 65 - 99 mg/dL   BUN 10 6 - 20 mg/dL   Creatinine, Ser 0.86 0.61 - 1.24 mg/dL   Calcium 8.5 (L) 8.9 - 10.3 mg/dL   GFR calc non Af Amer >60 >60 mL/min   GFR calc Af Amer >60 >60 mL/min    Comment: (NOTE) The eGFR has been calculated using the CKD EPI  equation. This calculation has not been validated in all clinical situations. eGFR's persistently <60 mL/min signify possible Chronic Kidney Disease.    Anion gap 6 5 - 15    Comment: Performed at [facility], [address], [place], [place] [address]    ABGS No results for input(s): PHART, PO2ART, TCO2, HCO3 in the last 72 hours.  Invalid input(s): PCO2 CULTURES Recent Results (from the past 240 hour(s))  Stat Gram stain     Status: None   Collection Time: 07/26/17  4:19 PM  Result Value Ref Range Status   Specimen Description PLEURAL RIGHT  Final   Special Requests NONE  Final   Gram Stain   Final    CYTOSPIN SMEAR WBC PRESENT, PREDOMINANTLY MONONUCLEAR NO ORGANISMS SEEN Performed at [facility] Performed at [facility], [address] [place], [place] [address]    Report Status 07/26/2017 FINAL  Final   Studies/Results: Dg Chest 1 View  Result Date: 07/26/2017 CLINICAL DATA:  RIGHT pleural effusion post thoracentesis EXAM: CHEST  1 VIEW  COMPARISON:  07/25/2017 FINDINGS: Normal heart size, mediastinal contours, and pulmonary vascularity. Persistent RIGHT pleural effusion with atelectasis throughout RIGHT lung. A more rounded area of opacity is seen at the mid to lower RIGHT hemithorax, question confluent atelectasis or infiltrate, without definite discrete mass by recent CT. No pneumothorax. Mild atelectasis at LEFT base. Bones demineralized. IMPRESSION: No pneumothorax following RIGHT thoracentesis. Electronically Signed   By: Lavonia Dana M.D.   On: 07/26/2017 15:56   US Thoracentesis Asp Pleural Space W/img Guide  Result Date: 07/26/2017 INDICATION: RIGHT pleural effusion, for diagnostic and therapeutic thoracentesis EXAM: ULTRASOUND GUIDED DIAGNOSTIC RIGHT THORACENTESIS MEDICATIONS: None. COMPLICATIONS: None immediate. PROCEDURE: Procedure, benefits, and risks of procedure were discussed with patient. Written informed consent for procedure was obtained. Time out protocol followed. Pleural effusion localized by ultrasound at the posterior RIGHT hemithorax. RIGHT pleural effusion is markedly loculated and highly complex in appearance. Skin prepped and draped in usual sterile fashion. Skin and soft tissues anesthetized with 10 mL of 1% lidocaine. 8 French thoracentesis catheter placed into the RIGHT pleural space. Only 23 mL of slightly cloudy RIGHT pleural fluid was able to be aspirated by syringe. Procedure tolerated well by patient without immediate complication. FINDINGS: Only 23 mL of RIGHT pleural fluid could be removed. Entire sample was sent to the laboratory as requested by the clinical team. IMPRESSION: Successful ultrasound guided RIGHT thoracentesis yielding 23 mL of pleural fluid. Electronically Signed   By: Lavonia Dana M.D.   On: 07/26/2017 16:02    Medications:  Prior to Admission:  No medications prior to admission.   Scheduled: . aspirin  325 mg Oral Daily  . atorvastatin  40 mg Oral q1800  . enoxaparin (LOVENOX)  injection  40 mg Subcutaneous Q24H  . lisinopril  10 mg Oral Daily  . mouth rinse  15 mL Mouth Rinse BID  . mometasone-formoterol  2 puff Inhalation BID  . thiamine injection  100 mg Intravenous Daily   Continuous: . sodium chloride 10 mL/hr at 07/25/17 2233   RNH:[id number], ondansetron **OR** ondansetron (ZOFRAN) IV, oxyCODONE-acetaminophen  Assesment: He has had an acute stroke.  He has abnormalities on chest x-ray that have progressed.  He has a loculated pleural effusion and cytology on the thoracentesis fluid is negative for tumor cells.  Palliative care assessment is underway but incomplete. Active Problems:   COPD (chronic obstructive pulmonary disease) (HCC)   Lung mass   Pleural effusion   Cerebrovascular  accident (CVA) [facility])   Palliative care encounter    Plan: Depending on what his family wants to do he may need thoracic surgery consultation but he may not be a candidate for any sort of invasive procedures considering his overall situation.  I certainly do not think he needs to be urgently sent for evaluation    LOS: 3 days   Danahi Reddish L 07/28/2017, 2:38 PM

## 2017-07-28 NOTE — Progress Notes (Signed)
  Speech Language Pathology Treatment: Dysphagia  Patient Details Name: Melvin Hart MRN: [id number] DOB: [birth year] Today's Date: 07/27/2017 Time: 1230-1300 SLP Time Calculation (min) (ACUTE ONLY): 30 min  Assessment / Plan / Recommendation Clinical Impression  Late entry for yesterday, 07/27/2017; Pt seen for ongoing diagnostic dysphagia intervention during lunch meal of mechanical soft and thin liquids. Pt required mod assist for self feeding due to cognitive deficits. Pt was sitting on edge of bed and attempting to self feed with fingers despite holding utensils. SLP gathered food on the fork and placed in Melvin Hart left hand and provided verbal and visual cues for self feeding. Pt without overt signs or symptoms of aspiration during lunch meal. Recommend continuing diet as ordered. Pt will benefit from f/u SLP services at next level of care.   HPI HPI: RogerGauldinis a11 y.o.male,with history of COPD, hypertension was brought to hospital with complaints of confusion. There is no family at bedside, history obtained from ED notes. Patient apparentlylives with his brother and brother called EMS stating that he was confused. 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 There is single lesion involving the left frontal region. There is also a right frontal small infarct. 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 SWI sequences are markedly degraded by motion artifact. FLAIR imaging shows mild periventricular white matter disease. Chest xray showed right pleural effusion with atelectasis throughout right lung, mild left base atelectasis. Pt had right thoracentesis early this afternoon. Pt passed RN swallow screen, however MD ordered BSE.       SLP Plan  Continue with current plan of care       Recommendations  Diet recommendations: Dysphagia 3 (mechanical  soft);Thin liquid Liquids provided via: Cup;Straw Medication Administration: Whole meds with liquid Supervision: Staff to assist with self feeding;Full supervision/cueing for compensatory strategies Compensations: Slow rate;Small sips/bites Postural Changes and/or Swallow Maneuvers: Seated upright 90 degrees;Upright 30-60 min after meal                Oral Care Recommendations: Oral care BID;Staff/trained caregiver to provide oral care Follow up Recommendations: Skilled Nursing facility SLP Visit Diagnosis: Dysphagia, unspecified (R13.10) Plan: Continue with current plan of care       Thank you,  Melvin Hart, [place]                 [place] 07/27/2017, 9:34 PM

## 2017-07-28 NOTE — Consult Note (Addendum)
                                                                                 Consultation Note Date: 07/28/2017   Patient Name: Melvin Hart  DOB: 05/17/1945  MRN: [id number]  Age / Sex: 72 y.o., male  PCP: Pantea, Liliana, MD Referring Physician: Hernandez Acosta, Estela*  Reason for Consultation: Establishing goals of care  HPI/Patient Profile: 72 y.o. male  with past medical history of COPD, gout, kidney stones, dementia who was admitted on 07/25/2017 with stroke.   Imaging reveals multiple previous strokes and two more recent strokes.  CT chest shows enlarged nodes and possible central bronchial obstruction with a loculated pleural effusion.  These pulmonary changes appear to have been developing over time as pleural effusion and bronchial obstruction were mentioned on imaging done in 2015.  Clinical Assessment and Goals of Care:  I have reviewed medical records including EPIC notes, labs and imaging, received report from the attending physician, the RN and the physical therapist, assessed the patient and then met at the bedside along with his brother Melvin Hart to discuss diagnosis prognosis, GOC, EOL wishes, disposition and options.  I introduced Palliative Medicine as specialized medical care for people living with serious illness. It focuses on providing relief from the symptoms and stress of a serious illness. The goal is to improve quality of life for both the patient and the family.  We discussed a brief life review of the patient. Melvin Hart was born and raised in [place].  He had 3 brothers and a sister.  He has been married multiple times and is current divorced.  He has an estranged daughter that he has not spoken to in years.  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  He currently lives with his brother Melvin Hart and Melvin Hart's girl friend Rocky.  His brother Melvin Hart is meeting with me today and serves as his health care decision surrogate.  However in making decisions Melvin Hart  works together with the rest of the family.  One of [name] brothers (Melvin Hart) died about 3 weeks ago.  Melvin Hart had advanced dementia and died with aspiration pneumonia.  Per Melvin Hart, Melvin Hart has been showing signs of dementia for the past two years.  It started when he was driving home on familiar roads and got lost.  His family received a phone call that evening to come get him.    Melvin Hart lives with Melvin Hart.  Each morning he gets up and cooks breakfast for the house.  After breakfast he takes a nap.  Then per Melvin Hart, at about 11:00 am Melvin Hart and Melvin Hart start drinking.  They drink, smoke and watch westerns until dinner time when they eat and go to bed.    About 3 weeks ago Bentleigh developed a severe headache that lasted longer than normal.  He went to bed.  He has not touched a drop of alcohol or smoked a cigarette since.  Melvin Hart reports it is like he has forgotten about it.  Melvin Hart has also become very stubborn not wanting to bathe, dress or go to the doctor.  Melvin Hart states Melvin Hart has declined in the last 3 weeks.    We discussed discharging to SNF vs discharging back to [facility] home.  Melvin Hart understands SNF's ability to promote rehab, but he is also concerned about changing Melvin Hart's environment.  Melvin Hart wants to discuss it with Melvin Hart and [facility] but he thinks [place] may be better cared for at home.  We discussed code status.  Melvin Hart states his opinion is that Melvin Hart should be DNR, but he wants to discuss it with Melvin Hart and [place] first.  We also discussed whether or not to pursue work up of the loculated pleural effusion.  Again Melvin Hart is inclined to opt for quality of life over quantity, but he wants to discuss it with the rest of the family before making a firm decision.  We reviewed both the imaging of the brain and of the chest.    Questions and concerns were addressed.  MOST form reviewed and left for consideration.  Hard Choices booklet left for review. The family was encouraged to call with questions or concerns.    Primary  Decision Maker:  NEXT OF KIN Melvin Hart Dere[place].  Melvin Hart works in coordination with his brother Melvin Hart as well.    SUMMARY OF RECOMMENDATIONS    Melvin Hart wants to confer with Melvin Hart about code status and whether or not to work up the pleural effusion.  Melvin Hart is inclined to take the conservative approach on both of these issues.  SNF vs home with home health.  The family is considering whether or not to take him home.  Melvin Hart.  May have additional benefits available for services in the home or at SNF.  Recommend Palliative to follow on discharge.  Unfortunately PMT will not return until Monday.  If Mr. Melvin Hart remains in the hospital we will follow up on Melvin Hart at that time.  Code Status/Advance Care Planning:  DNR   Symptom Management:   Per primary team.  Patient appears comfortable.  Patient requires feeding assistance with queueing  Palliative Prophylaxis:   Aspiration  Psycho-social/Spiritual:   Desire for further Chaplaincy support: Patient has a religious background  Prognosis:  Unable to determine.  However patient likely has cancer and a hypercoagulable state.  He also has advanced pulmonary disease.  He is at high risk for returning to the hospital or an adverse event that could quickly take his life.   Discharge Planning: To Be Determined  SNF with Palliative vs Home with Home Health and Palliative      Primary Diagnoses: Present on Admission: . Lung mass . COPD (chronic obstructive pulmonary disease) ([place])   I have reviewed the medical record, interviewed the patient and family, and examined the patient. The following aspects are pertinent.  Past Medical History:  Diagnosis Date  . COPD (chronic obstructive pulmonary disease) ([place]) 09/22/2010  . 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 History of Gout 10/12/2010  . History of Venereal disease 10/12/2010  . Hypertension    Social History   Socioeconomic History  . Marital status: Divorced    Spouse  name: Not on file  . Number of children: Not on file  . Years of education: Not on file  . Highest education level: Not on file  Occupational History  . Not on file  Social Needs  . Financial resource strain: Not on file  . Food insecurity:    Worry: Not on file    Inability: Not on file  . Transportation needs:    Medical: Not on file    Non-medical: Not on file  Tobacco Use  .  Smoking status: Current Every Day Smoker    Packs/day: 1.00    Types: Cigarettes  . Smokeless tobacco: Never Used  Substance and Sexual Activity  . Alcohol use: Not Currently    Comment: daily-quit 1 month ago 07/25/17  . Drug use: No  . Sexual activity: Not Currently  Lifestyle  . Physical activity:    Days per week: Not on file    Minutes per session: Not on file  . Stress: Not on file  Relationships  . Social connections:    Talks on phone: Not on file    Gets together: Not on file    Attends religious service: Not on file    Active member of club or organization: Not on file    Attends meetings of clubs or organizations: Not on file    Relationship status: Not on file  Other Topics Concern  . Not on file  Social History Narrative  . Not on file   History reviewed. No pertinent family history. Scheduled Meds: . aspirin  325 mg Oral Daily  . atorvastatin  40 mg Oral q1800  . enoxaparin (LOVENOX) injection  40 mg Subcutaneous Q24H  . lisinopril  10 mg Oral Daily  . mouth rinse  15 mL Mouth Rinse BID  . mometasone-formoterol  2 puff Inhalation BID  . thiamine injection  100 mg Intravenous Daily   Continuous Infusions: . sodium chloride 10 mL/hr at 07/25/17 2233   PRN Meds:.ipratropium-albuterol, ondansetron **OR** ondansetron (ZOFRAN) IV, oxyCODONE-acetaminophen Allergies  Allergen Reactions  . Cephalexin Itching  . Codeine Anxiety   Review of Systems patient currently has no complaints.  He is alert only to person.  Physical Exam  Well developed, chronically ill appearing male,  awake, cooperative. CV rrr resp no distress Abdomen soft, nd, nt  Vital Signs: BP 115/67 (BP Location: Left Arm)   Pulse 69   Temp 98.3 F (36.8 C) (Oral)   Resp 18   Ht 5' 8" (1.727 m)   Wt 81.6 kg (180 lb)   SpO2 96%   BMI 27.37 kg/m  Pain Scale: 0-10   Pain Score: 0-No pain   SpO2: SpO2: 96 % O2 Device:SpO2: 96 % O2 Flow Rate: .O2 Flow Rate (L/min): 0 L/min  IO: Intake/output summary:   Intake/Output Summary (Last 24 hours) at 07/28/2017 1100 Last data filed at 07/28/2017 0600 Gross per 24 hour  Intake 840 ml  Output -  Net 840 ml    LBM: Last BM Date: (pt does not know) Baseline Weight: Weight: 81.6 kg (180 lb) Most recent weight: Weight: 81.6 kg (180 lb)     Palliative Assessment/Data: 50%     Time In: 11:00 Time Out: 12:10 Time Total: 70 min. Greater than 50%  of this time was spent counseling and coordinating care related to the above assessment and plan.  Signed by: Florentina Jenny, PA-C Palliative Medicine Pager: [phone]  Please contact Palliative Medicine Team phone at [phone] for questions and concerns.  For individual provider: See Shea Evans

## 2017-07-29 DIAGNOSIS — Z9889 Other specified postprocedural states: Secondary | ICD-10-CM

## 2017-07-29 DIAGNOSIS — J42 Unspecified chronic bronchitis: Secondary | ICD-10-CM

## 2017-07-29 MED ORDER — THIAMINE HCL 100 MG/ML IJ SOLN: 100.0000 mg | Freq: Every day | INTRAMUSCULAR | Status: DC

## 2017-07-29 MED ORDER — ATORVASTATIN CALCIUM 40 MG PO TABS: 40.0000 mg | ORAL_TABLET | Freq: Every day | ORAL | Status: AC

## 2017-07-29 MED ORDER — ASPIRIN 325 MG PO TABS: 325.0000 mg | ORAL_TABLET | Freq: Every day | ORAL | Status: AC

## 2017-07-29 MED ORDER — MOMETASONE FURO-FORMOTEROL FUM 200-5 MCG/ACT IN AERO: 2.0000 | INHALATION_SPRAY | Freq: Two times a day (BID) | RESPIRATORY_TRACT | Status: AC

## 2017-07-29 NOTE — Progress Notes (Signed)
Physical Therapy Treatment Patient Details Name: Melvin Hart MRN: [id number] DOB: 01/28/1946 Today's Date: 07/29/2017    History of Present Illness Melvin Hart  is a 72 y.o. male, with history of COPD, hypertension was brought to hospital with complaints of confusion.  There is no family at bedside, history obtained from ED notes.  Patient apparently lives with his brother and brother called EMS stating that he was confused. MRI positive for CVA    PT Comments    Patient presents up in chair (assisted by OT) and agreeable for therapy.  Patient demonstrates slow slightly labored cadence without loss of balance, unsteady when making turns with RW, overall demonstrates increased endurance/distance for walking, but limited mostly due to c/o dizziness.  Patient continued sitting up in chair to eat breakfast - nursing staff notified.  Patient will benefit from continued physical therapy in hospital and recommended venue below to increase strength, balance, endurance for safe ADLs and gait.    Follow Up Recommendations  SNF;Supervision/Assistance - 24 hour     Equipment Recommendations  Rolling walker with 5" wheels    Recommendations for Other Services       Precautions / Restrictions Precautions Precautions: Fall Restrictions Weight Bearing Restrictions: No    Mobility  Bed Mobility Overal bed mobility: Needs Assistance Bed Mobility: Supine to Sit     Supine to sit: Supervision     General bed mobility comments: Patient presents up in chair (assisted by OT)  Transfers Overall transfer level: Needs assistance Equipment used: Rolling walker (2 wheeled) Transfers: Sit to/from [facility] Sit to Stand: Min guard Stand pivot transfers: Min guard       General transfer comment: slightly labored slow movement  Ambulation/Gait Ambulation/Gait assistance: Min guard Ambulation Distance (Feet): 50 Feet Assistive device: Rolling walker (2 wheeled) Gait  Pattern/deviations: Decreased step length - right;Decreased step length - left;Decreased stride length Gait velocity: slow   General Gait Details: slow slightly labored cadence, no loss of balance, limited secondary to c/o dizziness   Stairs             Wheelchair Mobility    Modified Rankin (Stroke Patients Only)       Balance Overall balance assessment: Needs assistance Sitting-balance support: Feet supported;No upper extremity supported Sitting balance-Leahy Scale: Good     Standing balance support: Bilateral upper extremity supported;During functional activity Standing balance-Leahy Scale: Fair Standing balance comment: fair using RW                            Cognition Arousal/Alertness: Awake/alert Behavior During Therapy: WFL for tasks assessed/performed Overall Cognitive Status: Impaired/Different from baseline Area of Impairment: Memory;Orientation                 Orientation Level: Person   Memory: Decreased short-term memory         General Comments: poor memory, requires frequent repeated verbal cues to complete tasks      Exercises General Exercises - Upper Extremity Shoulder Flexion: AROM;10 reps Shoulder Horizontal ABduction: AROM;10 reps Shoulder Horizontal ADduction: AROM;10 reps Elbow Flexion: AROM;10 reps Elbow Extension: AROM;10 reps Hand Exercises Digit Composite Flexion: AROM;10 reps Composite Extension: AROM;10 reps Other Exercises Other Exercises: chest press, A/ROM, 10X Other Exercises: overhead press, A/ROM, 10X    General Comments        Pertinent Vitals/Pain Pain Assessment: No/denies pain    Home Living  Prior Function            PT Goals (current goals can now be found in the care plan section) Acute Rehab PT Goals Patient Stated Goal: return home PT Goal Formulation: With patient/family Time For Goal Achievement: 08/10/17 Potential to Achieve Goals:  Good Progress towards PT goals: Progressing toward goals    Frequency    7X/week      PT Plan Current plan remains appropriate    Co-evaluation              AM-PAC PT "6 Clicks" Daily Activity  Outcome Measure  Difficulty turning over in bed (including adjusting bedclothes, sheets and blankets)?: None Difficulty moving from lying on back to sitting on the side of the bed? : None Difficulty sitting down on and standing up from a chair with arms (e.g., wheelchair, bedside commode, etc,.)?: A Little Help needed moving to and from a bed to chair (including a wheelchair)?: A Little Help needed walking in hospital room?: A Little Help needed climbing 3-5 steps with a railing? : A Little 6 Click Score: 20    End of Session   Activity Tolerance: Patient tolerated treatment well;Patient limited by fatigue(Patient limited secondary to dizziness) Patient left: in chair;with call bell/phone within reach;with chair alarm set Nurse Communication: Mobility status PT Visit Diagnosis: Unsteadiness on feet (R26.81);Other abnormalities of gait and mobility (R26.89);Muscle weakness (generalized) (M62.81)     Time: 9476-5465 PT Time Calculation (min) (ACUTE ONLY): 22 min  Charges:  $Gait Training: 8-22 mins $Therapeutic Activity: 8-22 mins                    G Codes:       9:13 AM, 08/14/2017 Lonell Grandchild, MPT Physical Therapist with [facility] 336 [phone] office [phone] mobile phone

## 2017-07-29 NOTE — Clinical Social Work Note (Signed)
Patient will be going to [facility]. [employer] authorization started today by facility. He cannot discharge until they have authorization unless family has available care givers for 24/7.    Keisha Amer, Clydene Pugh, LCSW

## 2017-07-29 NOTE — Progress Notes (Signed)
Subjective: He is a little more alert.  Still difficult to understand what he is saying.  He does not appear to be in any acute distress  Objective: Vital signs in last 24 hours: Temp:  [98.3 F (36.8 C)-98.9 F (37.2 C)] 98.3 F (36.8 C) (05/17 0522) Pulse Rate:  [63-79] 63 (05/17 0522) Resp:  [18] 18 (05/16 1417) BP: (100-118)/(63-74) 111/74 (05/17 0522) SpO2:  [94 %-97 %] 96 % (05/17 0744) Weight change:  Last BM Date: (pt does not know)  Intake/Output from previous day: No intake/output data recorded.  PHYSICAL EXAM General appearance: alert and no distress Resp: rhonchi bilaterally Cardio: regular rate and rhythm, S1, S2 normal, no murmur, click, rub or gallop GI: soft, non-tender; bowel sounds normal; no masses,  no organomegaly Extremities: extremities normal, atraumatic, no cyanosis or edema  Lab Results:  No results found for this or any previous visit (from the past 48 hour(s)).  ABGS No results for input(s): PHART, PO2ART, TCO2, HCO3 in the last 72 hours.  Invalid input(s): PCO2 CULTURES Recent Results (from the past 240 hour(s))  Stat Gram stain     Status: None   Collection Time: 07/26/17  4:19 PM  Result Value Ref Range Status   Specimen Description PLEURAL RIGHT  Final   Special Requests NONE  Final   Gram Stain   Final    CYTOSPIN SMEAR WBC PRESENT, PREDOMINANTLY MONONUCLEAR NO ORGANISMS SEEN Performed at [facility] Performed at [facility], [address], [place], [place] [address]    Report Status 07/26/2017 FINAL  Final   Studies/Results: No results found.  Medications:  Prior to Admission:  No medications prior to admission.   Scheduled: . aspirin  325 mg Oral Daily  . atorvastatin  40 mg Oral q1800  . enoxaparin (LOVENOX) injection  40 mg Subcutaneous Q24H  . lisinopril  10 mg Oral Daily  . mouth rinse  15 mL Mouth Rinse BID  . mometasone-formoterol  2 puff Inhalation BID  . thiamine injection  100 mg Intravenous Daily    Continuous: . sodium chloride 10 mL/hr at 07/25/17 2233   HER:[id number], ondansetron **OR** ondansetron (ZOFRAN) IV, oxyCODONE-acetaminophen  Assesment: He was admitted with a stroke.  He still has trouble with garbled speech from that.  He has a loculated pleural effusion and abnormalities in the right side of his chest CT.  He has been having difficulty with his CT being abnormal since at least 2015 but some of these changes are new.  He has what looks like some adenopathy that is new.  He has COPD at baseline which seems pretty well controlled Active Problems:   COPD (chronic obstructive pulmonary disease) (HCC)   Lung mass   Pleural effusion   Cerebrovascular accident (CVA) [facility])   Palliative care encounter    Plan: Continue treatments.  Palliative care consultation has been started but family meeting has not been held yet    LOS: 4 days   Addasyn Mcbreen L 07/29/2017, 8:39 AM

## 2017-07-29 NOTE — Progress Notes (Signed)
Occupational Therapy Treatment Patient Details Name: Melvin Hart MRN: [id number] DOB: 1945/11/14 Today's Date: 07/29/2017    History of present illness Melvin Hart  is a 72 y.o. male, with history of COPD, hypertension was brought to hospital with complaints of confusion.  There is no family at bedside, history obtained from ED notes.  Patient apparently lives with his brother and brother called EMS stating that he was confused. MRI positive for CVA   OT comments  Pt agreeable to OT session, continues to require step-by-step sequencing for task completion. Initiated BUE exercises today, RUE fatigues easily with intermittent rest breaks provided during exercises. OT notes RUE coordination difficulties during grooming tasks. Also of note pt attends to left side when looking in mirror, can attend to right with cuing. Discharge to SNF remains appropriate at this time.    Follow Up Recommendations  SNF;Supervision/Assistance - 24 hour    Equipment Recommendations  None recommended by OT       Precautions / Restrictions Precautions Precautions: Fall       Mobility Bed Mobility Overal bed mobility: Needs Assistance Bed Mobility: Supine to Sit     Supine to sit: Supervision        Transfers Overall transfer level: Needs assistance Equipment used: Rolling walker (2 wheeled) Transfers: Sit to/from [facility] Sit to Stand: Min guard Stand pivot transfers: Min guard                ADL either performed or assessed with clinical judgement   ADL Overall ADL's : Needs assistance/impaired     Grooming: Wash/dry hands;Wash/dry face;Brushing hair;Supervision/safety;Cueing for sequencing;Standing Grooming Details (indicate cue type and reason): Pt continues to require cuing for sequencing and task completion. Pt attempting to wash face with dry washcloth, confused when OT directed to wet cloth. Step by step instructions for turning water on, wetting cloth, and  ringing out required. Pt attempting to brush both sides of head, does well with left side, right difficult due to coordination deficits and inattention             Lower Body Dressing: Supervision/safety;Sitting/lateral leans Lower Body Dressing Details (indicate cue type and reason): pt able to adjust socks after instructions to do so             Functional mobility during ADLs: Min guard;Rolling walker General ADL Comments: Pt requiring cuing for initiating tasks               Cognition Arousal/Alertness: Awake/alert Behavior During Therapy: WFL for tasks assessed/performed Overall Cognitive Status: Impaired/Different from baseline Area of Impairment: Memory;Orientation                 Orientation Level: Person   Memory: Decreased short-term memory         General Comments: poor memory, requires frequent repeated verbal cues to complete tasks        Exercises Exercises: General Upper Extremity;Hand exercises;Other exercises General Exercises - Upper Extremity Shoulder Flexion: AROM;10 reps Shoulder Horizontal ABduction: AROM;10 reps Shoulder Horizontal ADduction: AROM;10 reps Elbow Flexion: AROM;10 reps Elbow Extension: AROM;10 reps Hand Exercises Digit Composite Flexion: AROM;10 reps Composite Extension: AROM;10 reps Other Exercises Other Exercises: chest press, A/ROM, 10X Other Exercises: overhead press, A/ROM, 10X           Pertinent Vitals/ Pain       Pain Assessment: No/denies pain         Frequency  Min 2X/week        Progress Toward  Goals  OT Goals(current goals can now be found in the care plan section)  Progress towards OT goals: Progressing toward goals  Acute Rehab OT Goals Patient Stated Goal: return home OT Goal Formulation: Patient unable to participate in goal setting Time For Goal Achievement: 08/10/17 Potential to Achieve Goals: Good ADL Goals Pt Will Perform Grooming: with modified independence;standing Pt  Will Perform Upper Body Dressing: with modified independence;sitting Pt Will Transfer to Toilet: with modified independence;regular height toilet;ambulating Pt Will Perform Toileting - Clothing Manipulation and hygiene: with modified independence;sitting/lateral leans;sit to/from stand Pt/caregiver will Perform Home Exercise Program: Increased strength;Both right and left upper extremity;With Supervision;With written HEP provided  Plan Discharge plan remains appropriate          End of Session Equipment Utilized During Treatment: Gait belt;Rolling walker  OT Visit Diagnosis: Muscle weakness (generalized) (M62.81);Other symptoms and signs involving cognitive function   Activity Tolerance Patient tolerated treatment well   Patient Left in chair;with call bell/phone within reach;with chair alarm set           Time: 8185-6314 OT Time Calculation (min): 21 min  Charges: OT General Charges $OT Visit: 1 Visit OT Treatments $Self Care/Home Management : 8-22 mins $Therapeutic Exercise: 8-22 mins    Guadelupe Sabin, OTR/L  [phone] 07/29/2017, 8:46 AM

## 2017-07-29 NOTE — Clinical Social Work Note (Signed)
LCSW spoke with [name], brother, and advised that patient had been declined by [facility] and that patient was discharged and LCSW would need additional facilities. [name] said he would look at the list and call CSW right back.     Tait Balistreri, Clydene Pugh, LCSW

## 2017-07-29 NOTE — Care Management Important Message (Signed)
Important Message  Patient Details  Name: Melvin Hart MRN: [id number] Date of Birth: 16-Oct-1945   Medicare Important Message Given:  Yes    Shelda Altes 07/29/2017, 10:56 AM

## 2017-07-29 NOTE — Discharge Summary (Addendum)
Physician Discharge Summary  Melvin Hart:[id number] DOB: 11-Jun-1945 DOA: 07/25/2017  PCP: Henreitta Cea, MD  Admit date: 07/25/2017 Discharge date: 08/01/2017  Time spent: 45 minutes  Recommendations for Outpatient Follow-up:  -To be discharged to skilled nursing facility today for short-term rehab following acute CVA. -Would recommend palliative care continue to follow him at SNF.   Addendum Patient's discharge was held until 5/20 due to insurance authorization for short-term SNF.  No changes to discharge summary.  Discharge Diagnoses:  Active Problems:   COPD (chronic obstructive pulmonary disease) (HCC)   Lung mass   Pleural effusion   Cerebrovascular accident (CVA) ([facility])   Palliative care encounter   Discharge Condition: Stable and improved  Filed Weights   07/25/17 1310  Weight: 81.6 kg (180 lb)    History of present illness:  As per Dr. Darrick Meigs on 5/13: Melvin Hart  is a 72 y.o. male, with history of COPD, hypertension was brought to hospital with complaints of confusion.  There is no family at bedside, history obtained from ED notes.  Patient apparently lives with his brother and brother called EMS stating that he was confused.  In the ED CT of the head showed subacute left MCA CVA, not a TPA candidate for unknown last time known normal and subacute presentation. CT of the chest showed interval development of loculated right pleural effusion with atelectasis or scarring in the right lung base.  Also shows development of right paratracheal and subcarinal adenopathy. Patient is a poor historian and unable to provide any significant history.    Hospital Course:   Acute metabolic encephalopathy -Secondary to subacute left MCA CVA. -Seen by neurology who is recommending a 30-day event monitor due to a simultaneous right frontal infarct which makes this worrisome for an embolic etiology.  Will arrange through the [facility] [place] office. -Continue  full dose aspirin for secondary stroke prevention. -LDL is 75, continue Lipitor. -2D echo: Ejection fraction of 60 to 65% with indeterminate diastolic function, normal wall motion.  Small apical lateral pericardial effusion with no tamponade physiology. -Carotid Dopplers: Left ICA stenosis 50 to 69%, right ICA stenosis less than 50%.  Antegrade vertebral flow bilaterally. -Seen by PT, will need short-term rehab. -Seen by speech therapy was recommended a dysphagia 3 diet with thin liquids.  Right loculated pleural effusion -Attempted thoracentesis on 5/14 which was unsuccessful due to loculation. -Some of these CT changes are old and date back to 2015.  At that time he also refused VATS. -Seen by Dr. Luan Pulling, next step would be to refer to CT surgery for consideration of VATS.  Unclear if he would be a good candidate in light of his recent CVA with severe deficits.  We will need to further discuss with family scope of care.  T palliative care has been following while hospitalized, no advance care decisions have been made as of yet.  Would recommend that palliative care continue to follow him upon arrival at [facility]. -Possibility of central obstructive endobronchial lesion is not excluded on the CT, there is also subcarinal and paratracheal adenopathy with unknown etiology but metastatic disease cannot be ruled out.  COPD -Stable, not exacerbated at present.  Normocytic anemia -Hemoglobin stable in the 9 range, studies consistent with anemia of chronic disease, no evidence for acute blood loss, no indication for acute transfusion.    Procedures:  Echo and carotid Dopplers as above  Consultations:  Neurology  Pulmonology  Palliative care  Discharge Instructions  Discharge Instructions    Diet - low sodium heart healthy   Complete by:  As directed    Increase activity slowly   Complete by:  As directed      Allergies as of 08/01/2017      Reactions   Cephalexin Itching   Codeine  Anxiety      Medication List    STOP taking these medications   SYMBICORT 160-4.5 MCG/ACT inhaler Generic drug:  budesonide-formoterol Replaced by:  mometasone-formoterol 200-5 MCG/ACT Aero     TAKE these medications   aspirin 325 MG tablet Take 1 tablet (325 mg total) by mouth daily.   atorvastatin 40 MG tablet Commonly known as:  LIPITOR Take 1 tablet (40 mg total) by mouth daily at 6 PM.   mometasone-formoterol 200-5 MCG/ACT Aero Commonly known as:  DULERA Inhale 2 puffs into the lungs 2 (two) times daily. Replaces:  SYMBICORT 160-4.5 MCG/ACT inhaler   thiamine 100 MG tablet Commonly known as:  VITAMIN B-1 Take 1 tablet (100 mg total) by mouth daily.      Allergies  Allergen Reactions  . Cephalexin Itching  . Codeine Anxiety   Contact information for after-discharge care    [facility] .   Service:  Skilled Nursing Contact information: [address] [place] [phone]               The results of significant diagnostics from this hospitalization (including imaging, microbiology, ancillary and laboratory) are listed below for reference.    Significant Diagnostic Studies: Dg Chest 1 View  Result Date: 07/26/2017 CLINICAL DATA:  RIGHT pleural effusion post thoracentesis EXAM: CHEST  1 VIEW COMPARISON:  07/25/2017 FINDINGS: Normal heart size, mediastinal contours, and pulmonary vascularity. Persistent RIGHT pleural effusion with atelectasis throughout RIGHT lung. A more rounded area of opacity is seen at the mid to lower RIGHT hemithorax, question confluent atelectasis or infiltrate, without definite discrete mass by recent CT. No pneumothorax. Mild atelectasis at LEFT base. Bones demineralized. IMPRESSION: No pneumothorax following RIGHT thoracentesis. Electronically Signed   By: Lavonia Dana M.D.   On: 07/26/2017 15:56   Ct Head Wo Contrast  Result Date: 07/25/2017 CLINICAL DATA:  Mental status  change today.  Combative. EXAM: CT HEAD WITHOUT CONTRAST TECHNIQUE: Contiguous axial images were obtained from the base of the skull through the vertex without intravenous contrast. COMPARISON:  None. FINDINGS: Brain: There is a moderate-sized posterior left MCA territory infarct predominantly involving the parietal lobe which is favored to be subacute. There could be minimal petechial hemorrhage, however no malignant hemorrhagic transformation is present. Minimal rightward midline shift measures 4 mm. Small infarcts involving the left caudate head and right parietal cortex are of indeterminate acuity. No extra-axial fluid collection is seen. Periventricular white matter hypodensities are nonspecific but compatible with mild chronic small vessel ischemic disease. There is moderate cerebral atrophy. Vascular: Calcified atherosclerosis at the skull base. No hyperdense vessel. Skull: No fracture or destructive osseous lesion. Sinuses/Orbits: Minimal bilateral ethmoid air cell mucosal thickening. Trace right mastoid fluid. Unremarkable orbits. Other: None. IMPRESSION: 1. Subacute, moderate-sized posterior left MCA infarct. 2. Small age indeterminate right parietal and left caudate infarcts. 3. Mild chronic small vessel ischemic disease and moderate cerebral atrophy. Electronically Signed   By: Logan Bores M.D.   On: 07/25/2017 15:49   Ct Chest W Contrast  Result Date: 07/25/2017 CLINICAL DATA:  Altered mental status. EXAM: CT CHEST WITH CONTRAST TECHNIQUE: Multidetector CT imaging of  the chest was performed during intravenous contrast administration. CONTRAST:  32mL ISOVUE-300 IOPAMIDOL (ISOVUE-300) INJECTION 61% COMPARISON:  CT scan of May 23, 2013. FINDINGS: Cardiovascular: Atherosclerosis of thoracic aorta is noted without aneurysm or dissection. Coronary calcifications are noted. No pericardial effusion is noted. Mediastinum/Nodes: Thyroid gland and esophagus are unremarkable. 1.9 cm subcarinal adenopathy is  noted 1.3 cm right paratracheal lymph node is noted. These are new since prior exam. Lungs/Pleura: No pneumothorax is noted. Left lung is clear. Emphysematous disease is noted in the right upper lobe. There is interval development of probable loculated right pleural effusion. Atelectasis or scarring is noted in the right lung base. Atelectasis of right upper lobe is noted; central endobronchial obstruction cannot be excluded. Upper Abdomen: Bilateral nephrolithiasis is noted. Musculoskeletal: No chest wall abnormality. No acute or significant osseous findings. IMPRESSION: Interval development of loculated right pleural effusion, with atelectasis or scarring noted in right lung base. Atelectasis of right upper lobe is noted. The possibility of central obstructive endobronchial lesion cannot be excluded, and further evaluation with bronchoscopy is recommended. Interval development of right paratracheal and subcarinal adenopathy is noted; etiology is unknown, but metastatic disease cannot be excluded. Coronary artery calcifications are noted suggesting coronary artery disease. Bilateral nephrolithiasis. Aortic Atherosclerosis (ICD10-I70.0) and Emphysema (ICD10-J43.9). Electronically Signed   By: Marijo Conception, M.D.   On: 07/25/2017 15:52   Mr Jodene Nam Head Wo Contrast  Result Date: 07/26/2017 CLINICAL DATA:  72 year old male with altered mental status for 2 days. Subacute appearing posterior left MCA and other scattered age indeterminate infarcts on head CT yesterday. EXAM: MRI HEAD WITHOUT CONTRAST MRA HEAD WITHOUT CONTRAST TECHNIQUE: Multiplanar, multiecho pulse sequences of the brain and surrounding structures were obtained without intravenous contrast. Angiographic images of the head were obtained using MRA technique without contrast. COMPARISON:  Head CT 07/25/2017.  Cervical spine MRI 08/17/2011. FINDINGS: MRI HEAD FINDINGS Study is intermittently degraded by motion artifact despite repeated imaging attempts.  Brain: Confluent restricted diffusion in the left parietal lobe affecting cortex and white matter in an area of 5 centimeters (series 6, image 41), corresponding to the most confluent area of hypodensity in the posterior left hemisphere on CT yesterday. Ischemia tracks into the left occipital lobe corresponding to the left MCA/PCA watershed territory. Multifocal scattered additional cortical and subcortical white matter restricted diffusion elsewhere in the left MCA territory, including some of the anterior division (series 4, image 98). T2 and FLAIR hyperintensity in keeping with cytotoxic edema in the affected areas. No associated hemorrhage is evident. There is mild regional mass effect, including on the atrium of the left lateral ventricle. There is a solitary linear area of restricted diffusion in the contralateral right hemisphere affecting white matter in the inferior right parietal lobe seen on series 4, image 90. No associated hemorrhage or mass effect. No posterior fossa or pure posterior circulation restricted diffusion. Chronic lacunar infarcts in the left basal ganglia and left thalamus. The brainstem and cerebellum appear relatively spared. There is a small area of chronic cortical encephalomalacia in the right parietal lobe (series 9, image 33, which corresponds to the age indeterminate area seen by CT (#2 yesterday). No midline shift, evidence of mass lesion, ventriculomegaly, extra-axial collection or acute intracranial hemorrhage. Cervicomedullary junction and pituitary are within normal limits. Vascular: Major intracranial vascular flow voids are preserved. Skull and upper cervical spine: Negative visible cervical spine. Sinuses/Orbits: Grossly normal orbits soft tissues. Mild paranasal sinus mucosal thickening. Other: Trace right mastoid fluid. Grossly negative visible internal auditory structures.  Negative scalp and face soft tissues. MRA HEAD FINDINGS Intermittently degraded by motion.  Antegrade flow in the posterior circulation including codominant distal vertebral arteries. No distal vertebral stenosis. Patent PICA origins. Patent vertebrobasilar junction. No basilar stenosis. Tortuous basilar artery. Patent SCA and PCA origins. Symmetric bilateral PCA flow signal. Posterior communicating arteries are diminutive or absent. Antegrade flow in both ICA siphons. Bilateral siphon irregularity, no high-grade siphon stenosis suspected. Ophthalmic artery origins appear normal. Patent carotid termini. The right ACA A1 segment appears dominant and the left diminutive. The anterior communicating artery and visible ACA branches appear within normal limits. Both MCA M1 segments are patent. Motion artifact obscures detail of the bilateral MCA bifurcations. There does appear to be asymmetrically decreased flow signal in the posterior left MCA territory. IMPRESSION: 1. Acute infarcts in the Left MCA territory, most pronounced in the posterior division which corresponds to the CT abnormality yesterday. Cytotoxic edema with minimal mass effect. No associated hemorrhage. 2. Superimposed small acute white matter infarct in the posterior right MCA territory. Given the absence of other vascular territory infarcts this may constellation reflect synchronous small vessel disease rather than a recent embolic event from the heart or proximal aorta. 3. Motion degraded MRA is negative for large vessel occlusion. However, stenosis or occlusion is suspected in at least some posterior left MCA branches. 4. Chronic small and medium-sized vessel ischemia in the left deep gray matter nuclei and right parietal lobe. Electronically Signed   By: Genevie Ann M.D.   On: 07/26/2017 08:25   Mr Brain Wo Contrast  Result Date: 07/26/2017 CLINICAL DATA:  72 year old male with altered mental status for 2 days. Subacute appearing posterior left MCA and other scattered age indeterminate infarcts on head CT yesterday. EXAM: MRI HEAD WITHOUT  CONTRAST MRA HEAD WITHOUT CONTRAST TECHNIQUE: Multiplanar, multiecho pulse sequences of the brain and surrounding structures were obtained without intravenous contrast. Angiographic images of the head were obtained using MRA technique without contrast. COMPARISON:  Head CT 07/25/2017.  Cervical spine MRI 08/17/2011. FINDINGS: MRI HEAD FINDINGS Study is intermittently degraded by motion artifact despite repeated imaging attempts. Brain: Confluent restricted diffusion in the left parietal lobe affecting cortex and white matter in an area of 5 centimeters (series 6, image 41), corresponding to the most confluent area of hypodensity in the posterior left hemisphere on CT yesterday. Ischemia tracks into the left occipital lobe corresponding to the left MCA/PCA watershed territory. Multifocal scattered additional cortical and subcortical white matter restricted diffusion elsewhere in the left MCA territory, including some of the anterior division (series 4, image 98). T2 and FLAIR hyperintensity in keeping with cytotoxic edema in the affected areas. No associated hemorrhage is evident. There is mild regional mass effect, including on the atrium of the left lateral ventricle. There is a solitary linear area of restricted diffusion in the contralateral right hemisphere affecting white matter in the inferior right parietal lobe seen on series 4, image 90. No associated hemorrhage or mass effect. No posterior fossa or pure posterior circulation restricted diffusion. Chronic lacunar infarcts in the left basal ganglia and left thalamus. The brainstem and cerebellum appear relatively spared. There is a small area of chronic cortical encephalomalacia in the right parietal lobe (series 9, image 33, which corresponds to the age indeterminate area seen by CT (#2 yesterday). No midline shift, evidence of mass lesion, ventriculomegaly, extra-axial collection or acute intracranial hemorrhage. Cervicomedullary junction and pituitary are  within normal limits. Vascular: Major intracranial vascular flow voids are preserved. Skull and upper  cervical spine: Negative visible cervical spine. Sinuses/Orbits: Grossly normal orbits soft tissues. Mild paranasal sinus mucosal thickening. Other: Trace right mastoid fluid. Grossly negative visible internal auditory structures. Negative scalp and face soft tissues. MRA HEAD FINDINGS Intermittently degraded by motion. Antegrade flow in the posterior circulation including codominant distal vertebral arteries. No distal vertebral stenosis. Patent PICA origins. Patent vertebrobasilar junction. No basilar stenosis. Tortuous basilar artery. Patent SCA and PCA origins. Symmetric bilateral PCA flow signal. Posterior communicating arteries are diminutive or absent. Antegrade flow in both ICA siphons. Bilateral siphon irregularity, no high-grade siphon stenosis suspected. Ophthalmic artery origins appear normal. Patent carotid termini. The right ACA A1 segment appears dominant and the left diminutive. The anterior communicating artery and visible ACA branches appear within normal limits. Both MCA M1 segments are patent. Motion artifact obscures detail of the bilateral MCA bifurcations. There does appear to be asymmetrically decreased flow signal in the posterior left MCA territory. IMPRESSION: 1. Acute infarcts in the Left MCA territory, most pronounced in the posterior division which corresponds to the CT abnormality yesterday. Cytotoxic edema with minimal mass effect. No associated hemorrhage. 2. Superimposed small acute white matter infarct in the posterior right MCA territory. Given the absence of other vascular territory infarcts this may constellation reflect synchronous small vessel disease rather than a recent embolic event from the heart or proximal aorta. 3. Motion degraded MRA is negative for large vessel occlusion. However, stenosis or occlusion is suspected in at least some posterior left MCA branches. 4.  Chronic small and medium-sized vessel ischemia in the left deep gray matter nuclei and right parietal lobe. Electronically Signed   By: Genevie Ann M.D.   On: 07/26/2017 08:25   US Carotid Bilateral  Result Date: 07/26/2017 CLINICAL DATA:  Left MCA acute strokes EXAM: BILATERAL CAROTID DUPLEX ULTRASOUND TECHNIQUE: Pearline Cables scale imaging, color Doppler and duplex ultrasound were performed of bilateral carotid and vertebral arteries in the neck. COMPARISON:  07/26/2017 MRI FINDINGS: Criteria: Quantification of carotid stenosis is based on velocity parameters that correlate the residual internal carotid diameter with NASCET-based stenosis levels, using the diameter of the distal internal carotid lumen as the denominator for stenosis measurement. The following velocity measurements were obtained: RIGHT ICA: 108/32 cm/sec CCA: 65/78 cm/sec SYSTOLIC ICA/CCA RATIO:  1.1 ECA:  104 cm/sec LEFT ICA: 193/20 cm/sec CCA: 46/96 cm/sec SYSTOLIC ICA/CCA RATIO:  4.0 ECA:  69 cm/sec RIGHT CAROTID ARTERY: Minor echogenic shadowing plaque formation. No hemodynamically significant right ICA stenosis, velocity elevation, or turbulent flow. Degree of narrowing less than 50%. RIGHT VERTEBRAL ARTERY:  Antegrade LEFT CAROTID ARTERY: Moderate to severe heterogeneous calcified bifurcation atherosclerosis. This results in luminal narrowing by grayscale imaging. Proximal left ICA velocity elevation and turbulent flow noted. Left proximal ICA velocity measures 193/25 centimeters/second. By ultrasound criteria left ICA stenosis estimated at 50-69%. LEFT VERTEBRAL ARTERY:  Antegrade IMPRESSION: Left greater than right carotid atherosclerosis. Left ICA stenosis estimated at 50-69% by ultrasound criteria Right ICA narrowing less than 50% Patent antegrade vertebral flow bilaterally Electronically Signed   By: Jerilynn Mages.  Shick M.D.   On: 07/26/2017 09:15   Dg Chest Port 1 View  Result Date: 07/25/2017 CLINICAL DATA:  Confusion and weakness EXAM: PORTABLE  CHEST 1 VIEW COMPARISON:  05/23/2013 FINDINGS: Cardiac shadow is within normal limits. Increasing effusion and likely underlying infiltrate is noted on the right. The left lung remains clear. No bony abnormality is noted. IMPRESSION: Increasing opacity within the right hemithorax consistent with infiltrate and underlying effusion. Electronically Signed   By:  Inez Catalina M.D.   On: 07/25/2017 14:09   US Thoracentesis Asp Pleural Space W/img Guide  Result Date: 07/26/2017 INDICATION: RIGHT pleural effusion, for diagnostic and therapeutic thoracentesis EXAM: ULTRASOUND GUIDED DIAGNOSTIC RIGHT THORACENTESIS MEDICATIONS: None. COMPLICATIONS: None immediate. PROCEDURE: Procedure, benefits, and risks of procedure were discussed with patient. Written informed consent for procedure was obtained. Time out protocol followed. Pleural effusion localized by ultrasound at the posterior RIGHT hemithorax. RIGHT pleural effusion is markedly loculated and highly complex in appearance. Skin prepped and draped in usual sterile fashion. Skin and soft tissues anesthetized with 10 mL of 1% lidocaine. 8 French thoracentesis catheter placed into the RIGHT pleural space. Only 23 mL of slightly cloudy RIGHT pleural fluid was able to be aspirated by syringe. Procedure tolerated well by patient without immediate complication. FINDINGS: Only 23 mL of RIGHT pleural fluid could be removed. Entire sample was sent to the laboratory as requested by the clinical team. IMPRESSION: Successful ultrasound guided RIGHT thoracentesis yielding 23 mL of pleural fluid. Electronically Signed   By: Lavonia Dana M.D.   On: 07/26/2017 16:02    Microbiology: Recent Results (from the past 240 hour(s))  Stat Gram stain     Status: None   Collection Time: 07/26/17  4:19 PM  Result Value Ref Range Status   Specimen Description PLEURAL RIGHT  Final   Special Requests NONE  Final   Gram Stain   Final    CYTOSPIN SMEAR WBC PRESENT, PREDOMINANTLY MONONUCLEAR  NO ORGANISMS SEEN Performed at [facility] Performed at [facility], [address], [place],  [address]    Report Status 07/26/2017 FINAL  Final     Labs: Basic Metabolic Panel: Recent Labs  Lab 07/26/17 0438 07/27/17 0509 08/01/17 0533  NA 136 135  --   K 4.0 4.0  --   CL 99* 100*  --   CO2 29 29  --   GLUCOSE 89 101*  --   BUN 10 10  --   CREATININE 0.80 0.86 0.75  CALCIUM 8.4* 8.5*  --    Liver Function Tests: Recent Labs  Lab 07/26/17 0438  AST 17  ALT 19  ALKPHOS 52  BILITOT 0.6  PROT 6.6  ALBUMIN 2.5*   No results for input(s): LIPASE, AMYLASE in the last 168 hours. No results for input(s): AMMONIA in the last 168 hours. CBC: Recent Labs  Lab 07/26/17 0438 07/27/17 0509  WBC 6.9 7.1  HGB 9.1* 9.5*  HCT 28.1* 29.9*  MCV 79.6 79.7  PLT 281 306   Cardiac Enzymes: No results for input(s): CKTOTAL, CKMB, CKMBINDEX, TROPONINI in the last 168 hours. BNP: BNP (last 3 results) No results for input(s): BNP in the last 8760 hours.  ProBNP (last 3 results) No results for input(s): PROBNP in the last 8760 hours.  CBG: No results for input(s): GLUCAP in the last 168 hours.     Signed:  Lelon Frohlich  [facility] Pager: [phone] 08/01/2017, 3:11 PM

## 2017-07-30 NOTE — Progress Notes (Signed)
Patient seen and examined, discussed with brother at bedside.  He is sitting in the chair, generally looks well, still with significant cognitive deficits.  He has been medically cleared for discharge to SNF for short-term rehab following acute CVA but is currently awaiting insurance authorization.  As soon as that returns should be ready to leave.  No new issues to report today, no changes to discharge summary dated 5/17.  Domingo Mend, MD [facility] Pager: [phone]

## 2017-07-30 NOTE — Progress Notes (Signed)
CSW spoke with Gerald Stabs from [facility].  [facility] has not received authorization.  Reed Breech LCSWA [phone]

## 2017-07-30 NOTE — Progress Notes (Signed)
Physical Therapy Treatment Patient Details Name: Melvin Hart MRN: [id number] DOB: Mar 11, 1946 Today's Date: 07/30/2017    History of Present Illness Melvin Hart  is a 72 y.o. male, with history of COPD, hypertension was brought to hospital with complaints of confusion.  There is no family at bedside, history obtained from ED notes.  Patient apparently lives with his brother and brother called EMS stating that he was confused. MRI positive for CVA    PT Comments    Patient demonstrates increased endurance/distance for ambulation with less c/o dizziness, tolerated most walking without c/o dizziness until halfway back to room and tolerated sitting up in chair to eat lunch after therapy - NT notified.  Patient will benefit from continued physical therapy in hospital and recommended venue below to increase strength, balance, endurance for safe ADLs and gait.   ollow Up Recommendations  SNF;Supervision/Assistance - 24 hour     Equipment Recommendations  Rolling walker with 5" wheels    Recommendations for Other Services       Precautions / Restrictions Precautions Precautions: Fall Restrictions Weight Bearing Restrictions: No    Mobility  Bed Mobility Overal bed mobility: Modified Independent             General bed mobility comments: increased time  Transfers Overall transfer level: Needs assistance Equipment used: Rolling walker (2 wheeled) Transfers: Sit to/from [facility] Sit to Stand: Min guard Stand pivot transfers: Min guard       General transfer comment: slow labored movement  Ambulation/Gait Ambulation/Gait assistance: Min guard Ambulation Distance (Feet): 60 Feet Assistive device: Rolling walker (2 wheeled) Gait Pattern/deviations: Decreased step length - right;Decreased step length - left;Decreased stride length Gait velocity: slow   General Gait Details: demonstrates increased endurance/distance for gait with slow slightly labored  cadence, for most of ambulation no c/o dizziness, but on the way back to room started to c/o dizziness   Stairs             Wheelchair Mobility    Modified Rankin (Stroke Patients Only)       Balance   Sitting-balance support: Feet unsupported;No upper extremity supported Sitting balance-Leahy Scale: Good     Standing balance support: Bilateral upper extremity supported;During functional activity Standing balance-Leahy Scale: Fair Standing balance comment: fair using RW                            Cognition Arousal/Alertness: Awake/alert Behavior During Therapy: WFL for tasks assessed/performed Overall Cognitive Status: Impaired/Different from baseline Area of Impairment: Memory;Orientation                 Orientation Level: Person   Memory: Decreased short-term memory         General Comments: poor memory, requires frequent repeated verbal cues to complete tasks      Exercises General Exercises - Lower Extremity Long Arc Quad: Seated;AROM;Strengthening;Both;10 reps Hip Flexion/Marching: Seated;AROM;Strengthening;10 reps;Both Toe Raises: Seated;AROM;Strengthening;Both;10 reps Heel Raises: Seated;AROM;Strengthening;Both;10 reps    General Comments        Pertinent Vitals/Pain Pain Assessment: No/denies pain    Home Living                      Prior Function            PT Goals (current goals can now be found in the care plan section) Acute Rehab PT Goals Patient Stated Goal: return home PT Goal Formulation: With patient/family Time  For Goal Achievement: 08/10/17 Potential to Achieve Goals: Good Progress towards PT goals: Progressing toward goals    Frequency    7X/week      PT Plan Current plan remains appropriate    Co-evaluation              AM-PAC PT "6 Clicks" Daily Activity  Outcome Measure  Difficulty turning over in bed (including adjusting bedclothes, sheets and blankets)?: None Difficulty  moving from lying on back to sitting on the side of the bed? : None Difficulty sitting down on and standing up from a chair with arms (e.g., wheelchair, bedside commode, etc,.)?: A Little Help needed moving to and from a bed to chair (including a wheelchair)?: A Little Help needed walking in hospital room?: A Little Help needed climbing 3-5 steps with a railing? : A Little 6 Click Score: 20    End of Session   Activity Tolerance: Patient tolerated treatment well;Patient limited by fatigue Patient left: in chair;with call bell/phone within reach;with chair alarm set Nurse Communication: Mobility status;Other (comment)(nursing staff notified patient left in chair) PT Visit Diagnosis: Unsteadiness on feet (R26.81);Other abnormalities of gait and mobility (R26.89);Muscle weakness (generalized) (M62.81)     Time: 6073-7106 PT Time Calculation (min) (ACUTE ONLY): 26 min  Charges:  $Therapeutic Activity: 23-37 mins                    G Codes:       12:21 PM, 08/10/17 Lonell Grandchild, MPT Physical Therapist with [facility] [phone] office [phone] mobile phone

## 2017-07-30 NOTE — Progress Notes (Signed)
Plans have been made for him to transfer to a skilled care facility for rehab from his stroke.  No cancer cells were seen on his thoracentesis.  Considering his overall poor health I think it would be reasonable to see how much recovery he makes from his stroke with plans for repeat CT in about 6 to 12 weeks to see if the adenopathy has progressed.  It is not at all clear that he would be a candidate for any sort of treatment.  I will sign off.  Thanks for allowing me to see him with you

## 2017-07-31 MED ORDER — DIPHENHYDRAMINE HCL 50 MG/ML IJ SOLN: 12.5000 mg | Freq: Once | INTRAMUSCULAR | Status: DC

## 2017-07-31 MED ORDER — HYDROCORTISONE 1 % EX CREA
TOPICAL_CREAM | Freq: Two times a day (BID) | CUTANEOUS | Status: DC
  Administered 2017-07-31 – 2017-08-01 (×2): via TOPICAL

## 2017-07-31 NOTE — Progress Notes (Signed)
Patient seen and examined, no family members at bedside.  Here with acute CVA and severe cognitive deficits, awaiting insurance authorization for SNF.  No acute events/complaints overnight.  He appears generally well.  Domingo Mend, MD [facility] Pager: [phone]

## 2017-07-31 NOTE — Progress Notes (Signed)
Physical Therapy Treatment Patient Details Name: Melvin Hart MRN: [id number] DOB: [birth year] Today's Date: 07/31/2017    History of Present Illness Melvin Hart  is a 72 y.o. male, with history of COPD, hypertension was brought to hospital with complaints of confusion.  There is no family at bedside, history obtained from ED notes.  Patient apparently lives with his brother and brother called EMS stating that he was confused. MRI positive for CVA    PT Comments    Patient required constant verbal cueing and occasional tactile cueing to complete exercises and functional tasks, overall demonstrates slightly improved balance walking without using an AD, occasionally has to lean on nearby objects for support, limited for gait training mostly due to c/o fatigue/dizziness and tolerated sitting up in chair after therapy - NT notified.  Patient will benefit from continued physical therapy in hospital and recommended venue below to increase strength, balance, endurance for safe ADLs and gait.    Follow Up Recommendations  SNF;Supervision/Assistance - 24 hour     Equipment Recommendations  Rolling walker with 5" wheels    Recommendations for Other Services       Precautions / Restrictions Precautions Precautions: Fall Restrictions Weight Bearing Restrictions: No    Mobility  Bed Mobility Overal bed mobility: Independent                Transfers Overall transfer level: Needs assistance Equipment used: None Transfers: Sit to/from Stand;Stand Pivot Transfers Sit to Stand: Min guard Stand pivot transfers: Min guard       General transfer comment: slight improvement in balance noted  Ambulation/Gait Ambulation/Gait assistance: Min guard Ambulation Distance (Feet): 50 Feet Assistive device: None Gait Pattern/deviations: Decreased step length - right;Decreased step length - left;Decreased stride length;Ataxic Gait velocity: slow   General Gait Details: Patient  demonstrates slow labored cadence with ataxic like gait with occasional leaning on nearby objects for support, no loss of balance, limited secondary to c/o dizziness   Stairs             Wheelchair Mobility    Modified Rankin (Stroke Patients Only)       Balance Overall balance assessment: Needs assistance Sitting-balance support: Feet supported;No upper extremity supported Sitting balance-Leahy Scale: Good     Standing balance support: No upper extremity supported;During functional activity Standing balance-Leahy Scale: Fair Standing balance comment: fair/poor without use of AD                            Cognition Arousal/Alertness: Awake/alert Behavior During Therapy: WFL for tasks assessed/performed Overall Cognitive Status: Impaired/Different from baseline Area of Impairment: Memory;Orientation                 Orientation Level: Person   Memory: Decreased short-term memory         General Comments: poor memory, requires frequent repeated verbal cues to complete tasks      Exercises General Exercises - Lower Extremity Long Arc Quad: Seated;AROM;Strengthening;Both;10 reps Hip Flexion/Marching: Seated;AROM;Strengthening;10 reps;Both Toe Raises: Seated;AROM;Strengthening;Both;10 reps Heel Raises: Seated;AROM;Strengthening;Both;10 reps    General Comments        Pertinent Vitals/Pain Pain Assessment: No/denies pain    Home Living                      Prior Function            PT Goals (current goals can now be found in the care plan section) Acute  Rehab PT Goals Patient Stated Goal: return home PT Goal Formulation: With patient/family Time For Goal Achievement: 08/10/17 Potential to Achieve Goals: Good Progress towards PT goals: Progressing toward goals    Frequency    7X/week      PT Plan Current plan remains appropriate    Co-evaluation              AM-PAC PT "6 Clicks" Daily Activity  Outcome  Measure  Difficulty turning over in bed (including adjusting bedclothes, sheets and blankets)?: None Difficulty moving from lying on back to sitting on the side of the bed? : None Difficulty sitting down on and standing up from a chair with arms (e.g., wheelchair, bedside commode, etc,.)?: A Little Help needed moving to and from a bed to chair (including a wheelchair)?: A Little Help needed walking in hospital room?: A Little Help needed climbing 3-5 steps with a railing? : A Little 6 Click Score: 20    End of Session Equipment Utilized During Treatment: Gait belt Activity Tolerance: Patient tolerated treatment well;Patient limited by fatigue(Patient limited by dizziness) Patient left: in chair;with call bell/phone within reach;with chair alarm set Nurse Communication: Mobility status;Other (comment)(nusing staff notified that patient left up in chair) PT Visit Diagnosis: Unsteadiness on feet (R26.81);Other abnormalities of gait and mobility (R26.89);Muscle weakness (generalized) (M62.81)     Time: 1115-1140 PT Time Calculation (min) (ACUTE ONLY): 25 min  Charges:  $Therapeutic Activity: 23-37 mins                    G Codes:       12:45 PM, 2017/08/19 Lonell Grandchild, MPT Physical Therapist with [facility] [phone] office [phone] mobile phone

## 2017-08-01 ENCOUNTER — Telehealth: Payer: Self-pay

## 2017-08-01 ENCOUNTER — Other Ambulatory Visit: Payer: Self-pay

## 2017-08-01 DIAGNOSIS — M6281 Muscle weakness (generalized): Secondary | ICD-10-CM | POA: Diagnosis not present

## 2017-08-01 DIAGNOSIS — I69991 Dysphagia following unspecified cerebrovascular disease: Secondary | ICD-10-CM | POA: Diagnosis not present

## 2017-08-01 DIAGNOSIS — E785 Hyperlipidemia, unspecified: Secondary | ICD-10-CM | POA: Diagnosis not present

## 2017-08-01 DIAGNOSIS — R4189 Other symptoms and signs involving cognitive functions and awareness: Secondary | ICD-10-CM | POA: Diagnosis not present

## 2017-08-01 DIAGNOSIS — J918 Pleural effusion in other conditions classified elsewhere: Secondary | ICD-10-CM | POA: Diagnosis not present

## 2017-08-01 DIAGNOSIS — J449 Chronic obstructive pulmonary disease, unspecified: Secondary | ICD-10-CM | POA: Diagnosis not present

## 2017-08-01 DIAGNOSIS — D509 Iron deficiency anemia, unspecified: Secondary | ICD-10-CM | POA: Diagnosis not present

## 2017-08-01 DIAGNOSIS — I639 Cerebral infarction, unspecified: Secondary | ICD-10-CM | POA: Diagnosis not present

## 2017-08-01 DIAGNOSIS — D508 Other iron deficiency anemias: Secondary | ICD-10-CM | POA: Diagnosis not present

## 2017-08-01 DIAGNOSIS — I63412 Cerebral infarction due to embolism of left middle cerebral artery: Secondary | ICD-10-CM | POA: Diagnosis not present

## 2017-08-01 LAB — CREATININE, SERUM
Creatinine, Ser: 0.75 mg/dL (ref 0.61–1.24)
GFR calc Af Amer: >60 mL/min
GFR calc non Af Amer: >60 mL/min

## 2017-08-01 MED ORDER — VITAMIN B-1 100 MG PO TABS: 100.0000 mg | ORAL_TABLET | Freq: Every day | ORAL | Status: AC

## 2017-08-01 NOTE — Telephone Encounter (Signed)
Order placed for event monitor, patient will have monitor mailed to home and family wil take to whichever SNF he ends up at. Still awaiting insurance approval.

## 2017-08-01 NOTE — Care Management Important Message (Signed)
Important Message  Patient Details  Name: LAKODA MCANANY MRN: [id number] Date of Birth: Feb 10, 1946   Medicare Important Message Given:  Yes    Shelda Altes 08/01/2017, 11:31 AM

## 2017-08-01 NOTE — Clinical Social Work Placement (Signed)
   CLINICAL SOCIAL WORK PLACEMENT  NOTE  Date:  08/01/2017  Patient Details  Name: Melvin Hart MRN: [id number] Date of Birth: 12/09/1945  Clinical Social Work is seeking post-discharge placement for this patient at the [place] level of care (*CSW will initial, date and re-position this form in  chart as items are completed):  Yes   Patient/family provided with [place] Work Department's list of facilities offering this level of care within the geographic area requested by the patient (or if unable, by the patient's family).  Yes   Patient/family informed of their freedom to choose among providers that offer the needed level of care, that participate in Medicare, Medicaid or managed care program needed by the patient, have an available bed and are willing to accept the patient.  Yes   Patient/family informed of [place]'s ownership interest in [facility] and [facility], as well as of the fact that they are under no obligation to receive care at these facilities.  PASRR submitted to EDS on 07/28/17     PASRR number received on 07/28/17     Existing PASRR number confirmed on       FL2 transmitted to all facilities in geographic area requested by pt/family on 07/29/17     FL2 transmitted to all facilities within larger geographic area on       Patient informed that his/her managed care company has contracts with or will negotiate with certain facilities, including the following:        Yes   Patient/family informed of bed offers received.  Patient chooses bed at [facility]     Physician recommends and patient chooses bed at      Patient to be transferred to [facility] on 08/01/17.  Patient to be transferred to facility by Brother, Enis Slipper.     Patient family notified on 08/01/17 of transfer.  Name of family member notified:  [name], brother     PHYSICIAN       Additional Comment:  Facility notified. Discharge  clinicals sent. LCSW signing off.   _______________________________________________ Ihor Gully, LCSW 08/01/2017, 2:11 PM

## 2017-08-01 NOTE — Progress Notes (Signed)
Patient seen and examined, database reviewed, no new events overnight.  Patient appears generally well although still with significant cognitive deficits.  Has been waiting over the weekend for insurance authorization to proceed with SNF placement.  This has been obtained today.  We will continue to follow for discharge needs.  No changes to discharge summary dated 5/18.  Domingo Mend, MD [facility] Pager: [phone]

## 2017-08-01 NOTE — Progress Notes (Signed)
Patient is to be discharged and in stable condition. Patient's IV removed, WNL. Patient and family made aware of discharge and agreeable. Report called to [facility]. Patient's brother to transport to facility. Patient escorted out by staff via wheelchair.  Celestia Khat, RN

## 2017-08-01 NOTE — Telephone Encounter (Signed)
-----   Message from Orinda Kenner sent at 07/29/2017  1:57 PM EDT ----- Regarding: FW: Event [place]   ----- Message ----- From: Isaac Bliss, Rayford Halsted, MD Sent: 07/29/2017  11:25 AM To: Bertram Gala Goins Subject: Event Monitoe                                  This patient needs a 30-day  event monitor following a hospitalization for acute stroke.  Thanks.  Domingo Mend, MD [facility] Pager: [phone]

## 2017-08-02 DIAGNOSIS — R4189 Other symptoms and signs involving cognitive functions and awareness: Secondary | ICD-10-CM | POA: Diagnosis not present

## 2017-08-02 DIAGNOSIS — I63412 Cerebral infarction due to embolism of left middle cerebral artery: Secondary | ICD-10-CM | POA: Diagnosis not present

## 2017-08-02 DIAGNOSIS — J918 Pleural effusion in other conditions classified elsewhere: Secondary | ICD-10-CM | POA: Diagnosis not present

## 2017-08-02 DIAGNOSIS — J449 Chronic obstructive pulmonary disease, unspecified: Secondary | ICD-10-CM | POA: Diagnosis not present

## 2017-08-03 DIAGNOSIS — J449 Chronic obstructive pulmonary disease, unspecified: Secondary | ICD-10-CM | POA: Diagnosis not present

## 2017-08-03 DIAGNOSIS — J918 Pleural effusion in other conditions classified elsewhere: Secondary | ICD-10-CM | POA: Diagnosis not present

## 2017-08-03 DIAGNOSIS — D508 Other iron deficiency anemias: Secondary | ICD-10-CM | POA: Diagnosis not present

## 2017-08-03 DIAGNOSIS — I63412 Cerebral infarction due to embolism of left middle cerebral artery: Secondary | ICD-10-CM | POA: Diagnosis not present

## 2017-08-10 DIAGNOSIS — I63412 Cerebral infarction due to embolism of left middle cerebral artery: Secondary | ICD-10-CM | POA: Diagnosis not present

## 2017-08-10 DIAGNOSIS — D508 Other iron deficiency anemias: Secondary | ICD-10-CM | POA: Diagnosis not present

## 2017-08-10 DIAGNOSIS — J449 Chronic obstructive pulmonary disease, unspecified: Secondary | ICD-10-CM | POA: Diagnosis not present

## 2017-08-10 DIAGNOSIS — E785 Hyperlipidemia, unspecified: Secondary | ICD-10-CM | POA: Diagnosis not present

## 2017-08-13 DIAGNOSIS — I69391 Dysphagia following cerebral infarction: Secondary | ICD-10-CM | POA: Diagnosis not present

## 2017-08-13 DIAGNOSIS — I10 Essential (primary) hypertension: Secondary | ICD-10-CM | POA: Diagnosis not present

## 2017-08-13 DIAGNOSIS — D649 Anemia, unspecified: Secondary | ICD-10-CM | POA: Diagnosis not present

## 2017-08-13 DIAGNOSIS — I69320 Aphasia following cerebral infarction: Secondary | ICD-10-CM | POA: Diagnosis not present

## 2017-08-13 DIAGNOSIS — J449 Chronic obstructive pulmonary disease, unspecified: Secondary | ICD-10-CM | POA: Diagnosis not present

## 2017-08-13 DIAGNOSIS — J90 Pleural effusion, not elsewhere classified: Secondary | ICD-10-CM | POA: Diagnosis not present

## 2017-08-13 DIAGNOSIS — R918 Other nonspecific abnormal finding of lung field: Secondary | ICD-10-CM | POA: Diagnosis not present

## 2017-08-13 DIAGNOSIS — R1310 Dysphagia, unspecified: Secondary | ICD-10-CM | POA: Diagnosis not present

## 2017-08-13 DIAGNOSIS — I69398 Other sequelae of cerebral infarction: Secondary | ICD-10-CM | POA: Diagnosis not present

## 2017-08-14 DIAGNOSIS — I69391 Dysphagia following cerebral infarction: Secondary | ICD-10-CM | POA: Diagnosis not present

## 2017-08-14 DIAGNOSIS — D649 Anemia, unspecified: Secondary | ICD-10-CM | POA: Diagnosis not present

## 2017-08-14 DIAGNOSIS — I10 Essential (primary) hypertension: Secondary | ICD-10-CM | POA: Diagnosis not present

## 2017-08-14 DIAGNOSIS — R1310 Dysphagia, unspecified: Secondary | ICD-10-CM | POA: Diagnosis not present

## 2017-08-14 DIAGNOSIS — J90 Pleural effusion, not elsewhere classified: Secondary | ICD-10-CM | POA: Diagnosis not present

## 2017-08-14 DIAGNOSIS — I69398 Other sequelae of cerebral infarction: Secondary | ICD-10-CM | POA: Diagnosis not present

## 2017-08-14 DIAGNOSIS — R918 Other nonspecific abnormal finding of lung field: Secondary | ICD-10-CM | POA: Diagnosis not present

## 2017-08-14 DIAGNOSIS — J449 Chronic obstructive pulmonary disease, unspecified: Secondary | ICD-10-CM | POA: Diagnosis not present

## 2017-08-14 DIAGNOSIS — I69320 Aphasia following cerebral infarction: Secondary | ICD-10-CM | POA: Diagnosis not present

## 2017-08-15 DIAGNOSIS — I69391 Dysphagia following cerebral infarction: Secondary | ICD-10-CM | POA: Diagnosis not present

## 2017-08-15 DIAGNOSIS — J90 Pleural effusion, not elsewhere classified: Secondary | ICD-10-CM | POA: Diagnosis not present

## 2017-08-15 DIAGNOSIS — I10 Essential (primary) hypertension: Secondary | ICD-10-CM | POA: Diagnosis not present

## 2017-08-15 DIAGNOSIS — R1310 Dysphagia, unspecified: Secondary | ICD-10-CM | POA: Diagnosis not present

## 2017-08-15 DIAGNOSIS — I69398 Other sequelae of cerebral infarction: Secondary | ICD-10-CM | POA: Diagnosis not present

## 2017-08-15 DIAGNOSIS — I69320 Aphasia following cerebral infarction: Secondary | ICD-10-CM | POA: Diagnosis not present

## 2017-08-15 DIAGNOSIS — D649 Anemia, unspecified: Secondary | ICD-10-CM | POA: Diagnosis not present

## 2017-08-15 DIAGNOSIS — R918 Other nonspecific abnormal finding of lung field: Secondary | ICD-10-CM | POA: Diagnosis not present

## 2017-08-15 DIAGNOSIS — J449 Chronic obstructive pulmonary disease, unspecified: Secondary | ICD-10-CM | POA: Diagnosis not present

## 2017-08-16 ENCOUNTER — Other Ambulatory Visit: Payer: Self-pay

## 2017-08-16 ENCOUNTER — Emergency Department (HOSPITAL_COMMUNITY): Payer: Medicare HMO

## 2017-08-16 ENCOUNTER — Encounter (HOSPITAL_COMMUNITY): Payer: Self-pay

## 2017-08-16 ENCOUNTER — Inpatient Hospital Stay (HOSPITAL_COMMUNITY)
Admission: EM | Admit: 2017-08-16 | Discharge: 2017-08-20 | DRG: 190 | Disposition: A | Discharge status: Home Health | Payer: Medicare HMO | Attending: Family Medicine | Admitting: Family Medicine

## 2017-08-16 DIAGNOSIS — Z7951 Long term (current) use of inhaled steroids: Secondary | ICD-10-CM

## 2017-08-16 DIAGNOSIS — R0902 Hypoxemia: Secondary | ICD-10-CM | POA: Diagnosis not present

## 2017-08-16 DIAGNOSIS — F0390 Unspecified dementia without behavioral disturbance: Secondary | ICD-10-CM | POA: Diagnosis present

## 2017-08-16 DIAGNOSIS — G9341 Metabolic encephalopathy: Secondary | ICD-10-CM | POA: Diagnosis not present

## 2017-08-16 DIAGNOSIS — J440 Chronic obstructive pulmonary disease with acute lower respiratory infection: Secondary | ICD-10-CM | POA: Diagnosis not present

## 2017-08-16 DIAGNOSIS — D509 Iron deficiency anemia, unspecified: Secondary | ICD-10-CM | POA: Diagnosis present

## 2017-08-16 DIAGNOSIS — J189 Pneumonia, unspecified organism: Secondary | ICD-10-CM | POA: Diagnosis not present

## 2017-08-16 DIAGNOSIS — D638 Anemia in other chronic diseases classified elsewhere: Secondary | ICD-10-CM | POA: Diagnosis present

## 2017-08-16 DIAGNOSIS — R042 Hemoptysis: Secondary | ICD-10-CM | POA: Diagnosis not present

## 2017-08-16 DIAGNOSIS — Z515 Encounter for palliative care: Secondary | ICD-10-CM | POA: Diagnosis not present

## 2017-08-16 DIAGNOSIS — Z7189 Other specified counseling: Secondary | ICD-10-CM

## 2017-08-16 DIAGNOSIS — R410 Disorientation, unspecified: Secondary | ICD-10-CM | POA: Diagnosis not present

## 2017-08-16 DIAGNOSIS — R404 Transient alteration of awareness: Secondary | ICD-10-CM | POA: Diagnosis not present

## 2017-08-16 DIAGNOSIS — Z885 Allergy status to narcotic agent status: Secondary | ICD-10-CM

## 2017-08-16 DIAGNOSIS — M109 Gout, unspecified: Secondary | ICD-10-CM | POA: Diagnosis present

## 2017-08-16 DIAGNOSIS — J90 Pleural effusion, not elsewhere classified: Secondary | ICD-10-CM | POA: Diagnosis not present

## 2017-08-16 DIAGNOSIS — R918 Other nonspecific abnormal finding of lung field: Secondary | ICD-10-CM | POA: Diagnosis present

## 2017-08-16 DIAGNOSIS — Z8673 Personal history of transient ischemic attack (TIA), and cerebral infarction without residual deficits: Secondary | ICD-10-CM

## 2017-08-16 DIAGNOSIS — Z7982 Long term (current) use of aspirin: Secondary | ICD-10-CM

## 2017-08-16 DIAGNOSIS — I10 Essential (primary) hypertension: Secondary | ICD-10-CM | POA: Diagnosis present

## 2017-08-16 DIAGNOSIS — I639 Cerebral infarction, unspecified: Secondary | ICD-10-CM | POA: Diagnosis present

## 2017-08-16 DIAGNOSIS — Z79899 Other long term (current) drug therapy: Secondary | ICD-10-CM

## 2017-08-16 DIAGNOSIS — R0682 Tachypnea, not elsewhere classified: Secondary | ICD-10-CM | POA: Diagnosis not present

## 2017-08-16 DIAGNOSIS — Z881 Allergy status to other antibiotic agents status: Secondary | ICD-10-CM

## 2017-08-16 DIAGNOSIS — Y95 Nosocomial condition: Secondary | ICD-10-CM | POA: Diagnosis present

## 2017-08-16 DIAGNOSIS — R0602 Shortness of breath: Secondary | ICD-10-CM | POA: Diagnosis not present

## 2017-08-16 DIAGNOSIS — R05 Cough: Secondary | ICD-10-CM | POA: Diagnosis not present

## 2017-08-16 DIAGNOSIS — R402441 Other coma, without documented Glasgow coma scale score, or with partial score reported, in the field [EMT or ambulance]: Secondary | ICD-10-CM | POA: Diagnosis not present

## 2017-08-16 DIAGNOSIS — F17210 Nicotine dependence, cigarettes, uncomplicated: Secondary | ICD-10-CM | POA: Diagnosis present

## 2017-08-16 DIAGNOSIS — Z8619 Personal history of other infectious and parasitic diseases: Secondary | ICD-10-CM

## 2017-08-16 DIAGNOSIS — R531 Weakness: Secondary | ICD-10-CM | POA: Diagnosis not present

## 2017-08-16 HISTORY — DX: Cerebral infarction, unspecified: I63.9

## 2017-08-16 LAB — CBC WITH DIFFERENTIAL/PLATELET
BASOS ABS: 0 10*3/uL (ref 0.0–0.1)
BASOS PCT: 0 %
Eosinophils Absolute: 0.1 10*3/uL (ref 0.0–0.7)
Eosinophils Relative: 1 %
HCT: 26 % — ABNORMAL LOW (ref 39.0–52.0)
HEMOGLOBIN: 8 g/dL — AB (ref 13.0–17.0)
Lymphocytes Relative: 21 %
Lymphs Abs: 2.5 10*3/uL (ref 0.7–4.0)
MCH: 24.7 pg — ABNORMAL LOW (ref 26.0–34.0)
MCHC: 30.8 g/dL (ref 30.0–36.0)
MCV: 80.2 fL (ref 78.0–100.0)
Monocytes Absolute: 0.7 10*3/uL (ref 0.1–1.0)
Monocytes Relative: 6 %
NEUTROS ABS: 8.6 10*3/uL — AB (ref 1.7–7.7)
NEUTROS PCT: 72 %
Platelets: 294 10*3/uL (ref 150–400)
RBC: 3.24 MIL/uL — AB (ref 4.22–5.81)
RDW: 16.3 % — ABNORMAL HIGH (ref 11.5–15.5)
WBC: 11.9 10*3/uL — AB (ref 4.0–10.5)

## 2017-08-16 LAB — COMPREHENSIVE METABOLIC PANEL
ALBUMIN: 2.1 g/dL — AB (ref 3.5–5.0)
ALK PHOS: 114 U/L (ref 38–126)
ALT: 60 U/L (ref 17–63)
AST: 47 U/L — AB (ref 15–41)
Anion gap: 10 (ref 5–15)
BILIRUBIN TOTAL: 0.6 mg/dL (ref 0.3–1.2)
BUN: 16 mg/dL (ref 6–20)
CALCIUM: 8 mg/dL — AB (ref 8.9–10.3)
CO2: 25 mmol/L (ref 22–32)
Chloride: 100 mmol/L — ABNORMAL LOW (ref 101–111)
Creatinine, Ser: 0.73 mg/dL (ref 0.61–1.24)
GFR calc Af Amer: >60 mL/min (ref >60)
GFR calc non Af Amer: >60 mL/min (ref >60)
GLUCOSE: 114 mg/dL — AB (ref 65–99)
Potassium: 4.3 mmol/L (ref 3.5–5.1)
Sodium: 135 mmol/L (ref 135–145)
TOTAL PROTEIN: 6.8 g/dL (ref 6.5–8.1)

## 2017-08-16 MED ORDER — LEVOFLOXACIN IN D5W 750 MG/150ML IV SOLN
750.0000 mg | Freq: Once | INTRAVENOUS | Status: AC
  Administered 2017-08-16: 750 mg via INTRAVENOUS
  Filled 2017-08-16: qty 150

## 2017-08-16 MED ORDER — ALBUTEROL SULFATE (2.5 MG/3ML) 0.083% IN NEBU
5.0000 mg | INHALATION_SOLUTION | Freq: Once | RESPIRATORY_TRACT | Status: AC
  Administered 2017-08-16: 5 mg via RESPIRATORY_TRACT
  Filled 2017-08-16: qty 6

## 2017-08-16 NOTE — ED Triage Notes (Signed)
EMS called out for SOB. Pt was discharged from this hospital Friday (08/13/17), however records indicate pt was admitted to [facility] on 07/25/17 for CVA. Family reports confusion has been intermittent since CVA. Family reported to EMS that pt cough up blood. Pt alert to name and birthday, but disoriented to place, date, and situation.

## 2017-08-16 NOTE — ED Notes (Signed)
Pt trying to get out of bed, pt redirectable  When patient was sitting up this nurse noticed a rash covering the patient's back.

## 2017-08-16 NOTE — ED Notes (Signed)
Pt given sandwich to eat at this time.

## 2017-08-16 NOTE — ED Notes (Signed)
Pt oxygen saturation was in 89-92% this nurse has placed patient on 2 L [place]  Pt oxygen saturation now 94%

## 2017-08-17 ENCOUNTER — Other Ambulatory Visit: Payer: Self-pay

## 2017-08-17 ENCOUNTER — Encounter (HOSPITAL_COMMUNITY): Payer: Self-pay

## 2017-08-17 DIAGNOSIS — R410 Disorientation, unspecified: Secondary | ICD-10-CM | POA: Diagnosis not present

## 2017-08-17 DIAGNOSIS — G9341 Metabolic encephalopathy: Secondary | ICD-10-CM

## 2017-08-17 DIAGNOSIS — Z515 Encounter for palliative care: Secondary | ICD-10-CM | POA: Diagnosis not present

## 2017-08-17 DIAGNOSIS — R0902 Hypoxemia: Secondary | ICD-10-CM | POA: Diagnosis not present

## 2017-08-17 DIAGNOSIS — Z8673 Personal history of transient ischemic attack (TIA), and cerebral infarction without residual deficits: Secondary | ICD-10-CM | POA: Diagnosis not present

## 2017-08-17 DIAGNOSIS — Y95 Nosocomial condition: Secondary | ICD-10-CM | POA: Diagnosis not present

## 2017-08-17 DIAGNOSIS — Z7189 Other specified counseling: Secondary | ICD-10-CM | POA: Diagnosis not present

## 2017-08-17 DIAGNOSIS — J440 Chronic obstructive pulmonary disease with acute lower respiratory infection: Secondary | ICD-10-CM | POA: Diagnosis not present

## 2017-08-17 DIAGNOSIS — D638 Anemia in other chronic diseases classified elsewhere: Secondary | ICD-10-CM | POA: Diagnosis not present

## 2017-08-17 DIAGNOSIS — F0390 Unspecified dementia without behavioral disturbance: Secondary | ICD-10-CM | POA: Diagnosis present

## 2017-08-17 DIAGNOSIS — I639 Cerebral infarction, unspecified: Secondary | ICD-10-CM | POA: Diagnosis not present

## 2017-08-17 DIAGNOSIS — D509 Iron deficiency anemia, unspecified: Secondary | ICD-10-CM | POA: Diagnosis not present

## 2017-08-17 DIAGNOSIS — Z79899 Other long term (current) drug therapy: Secondary | ICD-10-CM | POA: Diagnosis not present

## 2017-08-17 DIAGNOSIS — M109 Gout, unspecified: Secondary | ICD-10-CM | POA: Diagnosis present

## 2017-08-17 DIAGNOSIS — Z7982 Long term (current) use of aspirin: Secondary | ICD-10-CM | POA: Diagnosis not present

## 2017-08-17 DIAGNOSIS — Z8619 Personal history of other infectious and parasitic diseases: Secondary | ICD-10-CM | POA: Diagnosis not present

## 2017-08-17 DIAGNOSIS — J90 Pleural effusion, not elsewhere classified: Secondary | ICD-10-CM | POA: Diagnosis not present

## 2017-08-17 DIAGNOSIS — J189 Pneumonia, unspecified organism: Secondary | ICD-10-CM

## 2017-08-17 DIAGNOSIS — Z7951 Long term (current) use of inhaled steroids: Secondary | ICD-10-CM | POA: Diagnosis not present

## 2017-08-17 DIAGNOSIS — I10 Essential (primary) hypertension: Secondary | ICD-10-CM | POA: Diagnosis not present

## 2017-08-17 DIAGNOSIS — I499 Cardiac arrhythmia, unspecified: Secondary | ICD-10-CM | POA: Diagnosis not present

## 2017-08-17 DIAGNOSIS — R69 Illness, unspecified: Secondary | ICD-10-CM | POA: Diagnosis not present

## 2017-08-17 DIAGNOSIS — Z885 Allergy status to narcotic agent status: Secondary | ICD-10-CM | POA: Diagnosis not present

## 2017-08-17 DIAGNOSIS — R918 Other nonspecific abnormal finding of lung field: Secondary | ICD-10-CM | POA: Diagnosis not present

## 2017-08-17 DIAGNOSIS — Z881 Allergy status to other antibiotic agents status: Secondary | ICD-10-CM | POA: Diagnosis not present

## 2017-08-17 DIAGNOSIS — F17210 Nicotine dependence, cigarettes, uncomplicated: Secondary | ICD-10-CM | POA: Diagnosis present

## 2017-08-17 DIAGNOSIS — R042 Hemoptysis: Secondary | ICD-10-CM | POA: Diagnosis present

## 2017-08-17 LAB — COMPREHENSIVE METABOLIC PANEL
ALBUMIN: 1.9 g/dL — AB (ref 3.5–5.0)
ALK PHOS: 100 U/L (ref 38–126)
ALT: 55 U/L (ref 17–63)
ANION GAP: 7 (ref 5–15)
AST: 43 U/L — AB (ref 15–41)
BUN: 13 mg/dL (ref 6–20)
CALCIUM: 8 mg/dL — AB (ref 8.9–10.3)
CO2: 28 mmol/L (ref 22–32)
Chloride: 100 mmol/L — ABNORMAL LOW (ref 101–111)
Creatinine, Ser: 0.81 mg/dL (ref 0.61–1.24)
GFR calc Af Amer: >60 mL/min (ref >60)
GFR calc non Af Amer: >60 mL/min (ref >60)
GLUCOSE: 111 mg/dL — AB (ref 65–99)
POTASSIUM: 4.1 mmol/L (ref 3.5–5.1)
SODIUM: 135 mmol/L (ref 135–145)
Total Bilirubin: 0.5 mg/dL (ref 0.3–1.2)
Total Protein: 6.1 g/dL — ABNORMAL LOW (ref 6.5–8.1)

## 2017-08-17 LAB — CBC
HEMATOCRIT: 25.2 % — AB (ref 39.0–52.0)
HEMOGLOBIN: 8.1 g/dL — AB (ref 13.0–17.0)
MCH: 25.8 pg — ABNORMAL LOW (ref 26.0–34.0)
MCHC: 32.1 g/dL (ref 30.0–36.0)
MCV: 80.3 fL (ref 78.0–100.0)
Platelets: 322 10*3/uL (ref 150–400)
RBC: 3.14 MIL/uL — ABNORMAL LOW (ref 4.22–5.81)
RDW: 16.4 % — ABNORMAL HIGH (ref 11.5–15.5)
WBC: 12.1 10*3/uL — AB (ref 4.0–10.5)

## 2017-08-17 LAB — URINALYSIS, ROUTINE W REFLEX MICROSCOPIC
Bilirubin Urine: NEGATIVE
GLUCOSE, UA: NEGATIVE mg/dL
HGB URINE DIPSTICK: NEGATIVE
Ketones, ur: NEGATIVE mg/dL
Leukocytes, UA: NEGATIVE
Nitrite: NEGATIVE
PROTEIN: NEGATIVE mg/dL
SPECIFIC GRAVITY, URINE: 1.012 (ref 1.005–1.030)
pH: 6 (ref 5.0–8.0)

## 2017-08-17 MED ORDER — ACETAMINOPHEN 325 MG PO TABS: 650.0000 mg | ORAL_TABLET | Freq: Four times a day (QID) | ORAL | Status: DC | PRN

## 2017-08-17 MED ORDER — VANCOMYCIN HCL IN DEXTROSE 1-5 GM/200ML-% IV SOLN
1000.0000 mg | Freq: Once | INTRAVENOUS | Status: AC
  Administered 2017-08-17: 1000 mg via INTRAVENOUS
  Filled 2017-08-17: qty 200

## 2017-08-17 MED ORDER — ONDANSETRON HCL 4 MG PO TABS: 4.0000 mg | ORAL_TABLET | Freq: Four times a day (QID) | ORAL | Status: DC | PRN

## 2017-08-17 MED ORDER — SODIUM CHLORIDE 0.9 % IV SOLN
INTRAVENOUS | Status: DC
  Administered 2017-08-17 – 2017-08-19 (×3): via INTRAVENOUS

## 2017-08-17 MED ORDER — ONDANSETRON HCL 4 MG/2ML IJ SOLN: 4.0000 mg | Freq: Four times a day (QID) | INTRAMUSCULAR | Status: DC | PRN

## 2017-08-17 MED ORDER — HALOPERIDOL LACTATE 5 MG/ML IJ SOLN: 2.0000 mg | Freq: Four times a day (QID) | INTRAMUSCULAR | Status: DC | PRN

## 2017-08-17 MED ORDER — VANCOMYCIN HCL IN DEXTROSE 1-5 GM/200ML-% IV SOLN
1000.0000 mg | Freq: Two times a day (BID) | INTRAVENOUS | Status: DC
  Administered 2017-08-17 – 2017-08-19 (×4): 1000 mg via INTRAVENOUS
  Filled 2017-08-17 (×4): qty 200

## 2017-08-17 MED ORDER — QUETIAPINE FUMARATE 25 MG PO TABS
25.0000 mg | ORAL_TABLET | Freq: Every day | ORAL | Status: DC
  Administered 2017-08-17 – 2017-08-19 (×3): 25 mg via ORAL
  Filled 2017-08-17 (×3): qty 1

## 2017-08-17 MED ORDER — PIPERACILLIN-TAZOBACTAM 3.375 G IVPB
3.3750 g | Freq: Three times a day (TID) | INTRAVENOUS | Status: DC
  Administered 2017-08-17 – 2017-08-19 (×8): 3.375 g via INTRAVENOUS
  Filled 2017-08-17 (×7): qty 50

## 2017-08-17 NOTE — Care Management Note (Signed)
Case Management Note  Patient Details  Name: Melvin Hart MRN: [id number] Date of Birth: 09-05-1945  Subjective/Objective:  Adm with [facility]. From home. Recent admission with CVA and discharged to SNF. Patient is now back home with brother, Melvin Hart. Melvin Hart at bedside requesting to talk about discharge plans. Patient alert but not oriented.  Melvin Hart reports that they have [name], PT, and SLP.  He reports he is paying OOP for a caregiver that is present 24/7 to assist with ADL's, cooking, etc. He reports patient had been walking independently until yesterday. He reports patient does have RW and BSC at home.  Palliative consult pending.  Melvin Hart is aware.          Action/Plan: CM following for needs.   Expected Discharge Date:  08/20/17               Expected Discharge Plan:     In-House Referral:  Hospice / Palliative Care  Discharge planning Services  CM Consult  Post Acute Care Choice:    Choice offered to:     DME Arranged:    DME Agency:     HH Arranged:    HH Agency:     Status of Service:  In process, will continue to follow  If discussed at Long Length of Stay Meetings, dates discussed:    Additional Comments:  Carolene Gitto, Chauncey Reading, RN 08/17/2017, 12:09 PM

## 2017-08-17 NOTE — Progress Notes (Addendum)
[place] for vancomycin and zosyn Indication: pneumonia  Allergies  Allergen Reactions  . Cephalexin Itching  . Codeine Anxiety    Patient Measurements: Height: 5\' 8"  (172.7 cm) Weight: 180 lb (81.6 kg) IBW/kg (Calculated) : 68.4 Adjusted Body Weight:   Vital Signs: Temp: 98.9 F (37.2 C) (06/05 0239) Temp Source: Oral (06/05 0239) BP: 112/56 (06/05 0239) Pulse Rate: 71 (06/05 0239)  Labs: Recent Labs    08/16/17 2136  WBC 11.9*  HGB 8.0*  PLT 294  CREATININE 0.73   Estimated Creatinine Clearance: 81.9 mL/min (by C-G formula based on SCr of 0.73 mg/dL).  No results for input(s): VANCOTROUGH, VANCOPEAK, VANCORANDOM, GENTTROUGH, GENTPEAK, GENTRANDOM, TOBRATROUGH, TOBRAPEAK, TOBRARND, AMIKACINPEAK, AMIKACINTROU, AMIKACIN in the last 72 hours.   Medical History: Past Medical History:  Diagnosis Date  . COPD (chronic obstructive pulmonary disease) ([place]) 09/22/2010  . CVA (cerebral vascular accident) ([address])   . 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 History of Gout 10/12/2010  . History of Venereal disease 10/12/2010  . Hypertension    Antimicrobials this admission:  Vanc 6/5 >>  Zosyn 6/5 >>  Levaquin 6/4 x 1  Dose adjustments this admission:  n/a   Microbiology results:  6/5 BCx: pending 6/5 UCx: pending   Sputum:    MRSA PCR:    Assessment: 72 yo male starting vancomycin and zosyn for healthcare associated PNA.     Goal of Therapy:  Vancomycin trough level 15-20 mcg/ml  Plan:  Preliminary review of pertinent patient information completed.  Protocol will be initiated with dose(s) of vancomycin 1000 mg x 1 and zosyn 3.375 grams Q8 hours.  Forestine Na clinical pharmacist will complete review during morning rounds to assess patient and finalize treatment regimen if needed.  [place], Tiffany Darrick Grinder, RPH 08/17/2017,5:32 AM  Vancomycin 1gm IV every 12 hours. Goal Trough 15-20. Monitor labs, micro and vitals.   Pricilla Larsson, [facility] 08/17/2017 8:59 AM

## 2017-08-17 NOTE — Progress Notes (Signed)
08/16/2017  8:03 PM  08/17/2017 9:21 AM  Melvin Hart was seen and examined.  The H&P by the admitting provider, orders, imaging was reviewed.  Please see new orders.  Will continue to follow.  Vitals:   08/17/17 0239 08/17/17 0605  BP: (!) 112/56 103/71  Pulse: 71 86  Resp:    Temp: 98.9 F (37.2 C) 99.8 F (37.7 C)  SpO2: 98% 99%   Results for orders placed or performed during the hospital encounter of 08/16/17  Culture, blood (Routine X 2) w Reflex to ID Panel  Result Value Ref Range   Specimen Description BLOOD RIGHT ARM    Special Requests      BOTTLES DRAWN AEROBIC AND ANAEROBIC Blood Culture adequate volume   Culture      NO GROWTH < 12 HOURS Performed at [facility], [address], [place], [place] [address]    Report Status PENDING   Culture, blood (Routine X 2) w Reflex to ID Panel  Result Value Ref Range   Specimen Description BLOOD RIGHT ARM    Special Requests      BOTTLES DRAWN AEROBIC AND ANAEROBIC Blood Culture adequate volume   Culture      NO GROWTH < 12 HOURS Performed at [facility], [address] [place], [place] [address]    Report Status PENDING   CBC with Differential  Result Value Ref Range   WBC 11.9 (H) 4.0 - 10.5 K/uL   RBC 3.24 (L) 4.22 - 5.81 MIL/uL   Hemoglobin 8.0 (L) 13.0 - 17.0 g/dL   HCT 26.0 (L) 39.0 - 52.0 %   MCV 80.2 78.0 - 100.0 fL   MCH 24.7 (L) 26.0 - 34.0 pg   MCHC 30.8 30.0 - 36.0 g/dL   RDW 16.3 (H) 11.5 - 15.5 %   Platelets 294 150 - 400 K/uL   Neutrophils Relative % 72 %   Neutro Abs 8.6 (H) 1.7 - 7.7 K/uL   Lymphocytes Relative 21 %   Lymphs Abs 2.5 0.7 - 4.0 K/uL   Monocytes Relative 6 %   Monocytes Absolute 0.7 0.1 - 1.0 K/uL   Eosinophils Relative 1 %   Eosinophils Absolute 0.1 0.0 - 0.7 K/uL   Basophils Relative 0 %   Basophils Absolute 0.0 0.0 - 0.1 K/uL  Comprehensive metabolic panel  Result Value Ref Range   Sodium 135 135 - 145 mmol/L   Potassium 4.3 3.5 - 5.1 mmol/L   Chloride 100 (L) 101 - 111  mmol/L   CO2 25 22 - 32 mmol/L   Glucose, Bld 114 (H) 65 - 99 mg/dL   BUN 16 6 - 20 mg/dL   Creatinine, Ser 0.73 0.61 - 1.24 mg/dL   Calcium 8.0 (L) 8.9 - 10.3 mg/dL   Total Protein 6.8 6.5 - 8.1 g/dL   Albumin 2.1 (L) 3.5 - 5.0 g/dL   AST 47 (H) 15 - 41 U/L   ALT 60 17 - 63 U/L   Alkaline Phosphatase 114 38 - 126 U/L   Total Bilirubin 0.6 0.3 - 1.2 mg/dL   GFR calc non Af Amer >60 >60 mL/min   GFR calc Af Amer >60 >60 mL/min   Anion gap 10 5 - 15  Urinalysis, Routine w reflex microscopic  Result Value Ref Range   Color, Urine YELLOW YELLOW   APPearance CLEAR CLEAR   Specific Gravity, Urine 1.012 1.005 - 1.030   pH 6.0 5.0 - 8.0   Glucose, UA NEGATIVE NEGATIVE mg/dL  Hgb urine dipstick NEGATIVE NEGATIVE   Bilirubin Urine NEGATIVE NEGATIVE   Ketones, ur NEGATIVE NEGATIVE mg/dL   Protein, ur NEGATIVE NEGATIVE mg/dL   Nitrite NEGATIVE NEGATIVE   Leukocytes, UA NEGATIVE NEGATIVE  CBC  Result Value Ref Range   WBC 12.1 (H) 4.0 - 10.5 K/uL   RBC 3.14 (L) 4.22 - 5.81 MIL/uL   Hemoglobin 8.1 (L) 13.0 - 17.0 g/dL   HCT 25.2 (L) 39.0 - 52.0 %   MCV 80.3 78.0 - 100.0 fL   MCH 25.8 (L) 26.0 - 34.0 pg   MCHC 32.1 30.0 - 36.0 g/dL   RDW 16.4 (H) 11.5 - 15.5 %   Platelets 322 150 - 400 K/uL  Comprehensive metabolic panel  Result Value Ref Range   Sodium 135 135 - 145 mmol/L   Potassium 4.1 3.5 - 5.1 mmol/L   Chloride 100 (L) 101 - 111 mmol/L   CO2 28 22 - 32 mmol/L   Glucose, Bld 111 (H) 65 - 99 mg/dL   BUN 13 6 - 20 mg/dL   Creatinine, Ser 0.81 0.61 - 1.24 mg/dL   Calcium 8.0 (L) 8.9 - 10.3 mg/dL   Total Protein 6.1 (L) 6.5 - 8.1 g/dL   Albumin 1.9 (L) 3.5 - 5.0 g/dL   AST 43 (H) 15 - 41 U/L   ALT 55 17 - 63 U/L   Alkaline Phosphatase 100 38 - 126 U/L   Total Bilirubin 0.5 0.3 - 1.2 mg/dL   GFR calc non Af Amer >60 >60 mL/min   GFR calc Af Amer >60 >60 mL/min   Anion gap 7 5 - 15    C. Wynetta Emery, MD [facility]

## 2017-08-17 NOTE — Consult Note (Signed)
Consultation Note Date: 08/17/2017   Patient Name: Melvin Hart  DOB: 1945-08-24  MRN: [id number]  Age / Sex: 72 y.o., male  PCP: Henreitta Cea, MD Referring Physician: Murlean Iba, MD  Reason for Consultation: Establishing goals of care  HPI/Patient Profile: 72 y.o. male  with past medical history of COPD, ? still smoking, hypertension, subacute left MC a CVA, history of right loculated pleural effusion (offered work-up approximately 2 years ago which he declined), normocytic anemia admitted on 08/16/2017 with question healthcare associated pneumonia, altered mental status.   Clinical Assessment and Goals of Care: Mr. Melvin Hart is resting quietly in bed.  He will make an briefly keep eye contact.  He is alert, but oriented to self only.  There is no family at bedside at this time.  I asked who would speak for him if he were unable, he states that he lives with his brother Melvin Hart and names him.   Number listed in chart is for brothers Patrice Paradise.  Call to brother [name] who states that he is out of town at this point.  Melodie Bouillon states the goal is to treat the pneumonia with the hope of clearing [place] cognitive abilities.  He states the family's goal is also to have [place] completed.  Tre[place] had previously completed DURABLE [place] but not had it filed with the court house (it named his former wife).  [name] states that [name] has a living will that has been notarized.  I request [name] to bring this document to the hospital so that we may include this in his permanent record.  At this point, [name] is unable to say whether his brother would want life support.  We talked about the concept of treat the treatable but no extraordinary measures.    We talked about the pleural effusion.  [name] endorses that his brother new about his lung problem approximately 2 years ago, but "got up and walked out"  when the doctor was going to discuss options.  I share that if [name] knew he had's problem in his lung 2 years ago and did not want to take treatment at that point, respect for his autonomy would be to not do extraordinary measures.  [name] states that he will discuss choices with his brother Melvin Hart, and agrees to return phone call tomorrow.  Healthcare power of attorney NEXT OF KIN -when asked, Mr. Mcmichael states that his brother Melvin Hart, who he lives with, would make healthcare decisions if he could not.  No HC POA/right ear paperwork noted in document section of epic.  Per nursing staff, Mr. Melvin Hart lives with his brother Melvin Hart.  Previous palliative conversations with brother [name].  Call to [place] who states that he and his brother Melvin Hart help make choices for [employer].  He states that they also have a sister who lives in [place], but she will agree with what ever they decide.   SUMMARY OF RECOMMENDATIONS   At this point, continue to treat the treatable. Continue CODE STATUS discussions.  Seeking residential SNF placement.  Code Status/Advance Care Planning:  Full code -brother [name] endorses that [name] has a living will that has been notarized but is not on file.  I encouraged him to bring this document so that we may respect Mr. Melvin Hart and his wishes.  [name] is unable to endorse DNR today, but agrees to a return phone call tomorrow.  Symptom Management:   Per hospitalist, no additional needs at this time.  Palliative Prophylaxis:   [place]  Additional Recommendations (Limitations, Scope, Preferences):  Continue to treat the treatable, continue CODE STATUS discussions as is currently full code.  Psycho-social/Spiritual:   Desire for further Chaplaincy support:no  Additional Recommendations: Caregiving  Support/Resources and Education on Hospice  Prognosis:  < 6 months, or less would not be surprising based on recent hospitalization for acute stroke, poor functional status, loculated  pleural effusion that is described as complicated and multiloculated, but was negative for tumor cells, may not be a candidate for any sort of invasive procedures considering overall situation  Discharge Planning: To be determined, short-term rehab at [facility] after last hospitalization.  Family is considering residential SNF placement per nursing staff.      Primary Diagnoses: Present on Admission: . HCAP (healthcare-associated pneumonia) . Lung mass . Pleural effusion . Cerebrovascular accident (CVA) ([place])   I have reviewed the medical record, interviewed the patient and family, and examined the patient. The following aspects are pertinent.  Past Medical History:  Diagnosis Date  . COPD (chronic obstructive pulmonary disease) ([place]) 09/22/2010  . CVA (cerebral vascular accident) ([place])   . 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 History of Gout 10/12/2010  . History of Venereal disease 10/12/2010  . Hypertension    Social History   Socioeconomic History  . Marital status: Divorced    Spouse name: Not on file  . Number of children: Not on file  . Years of education: Not on file  . Highest education level: Not on file  Occupational History  . Not on file  Social Needs  . Financial resource strain: Not on file  . Food insecurity:    Worry: Not on file    Inability: Not on file  . Transportation needs:    Medical: Not on file    Non-medical: Not on file  Tobacco Use  . Smoking status: Current Every Day Smoker    Packs/day: 1.00    Types: Cigarettes  . Smokeless tobacco: Never Used  Substance and Sexual Activity  . Alcohol use: Not Currently    Comment: daily-quit 1 month ago 07/25/17  . Drug use: No  . Sexual activity: Not Currently  Lifestyle  . Physical activity:    Days per week: Not on file    Minutes per session: Not on file  . Stress: Not on file  Relationships  . Social connections:    Talks on phone: Not on file    Gets together: Not  on file    Attends religious service: Not on file    Active member of club or organization: Not on file    Attends meetings of clubs or organizations: Not on file    Relationship status: Not on file  Other Topics Concern  . Not on file  Social History Narrative  . Not on file   History reviewed. No pertinent family history. Scheduled Meds: Continuous Infusions: . sodium chloride 75 mL/hr at 08/17/17 0302  . piperacillin-tazobactam (ZOSYN)  IV 3.375 g (08/17/17 1045)  .  vancomycin     PRN Meds:.ondansetron **OR** ondansetron (ZOFRAN) IV Medications Prior to Admission:  Prior to Admission medications   Medication Sig Start Date End Date Taking? Authorizing Provider  ADVAIR DISKUS 250-50 MCG/DOSE AEPB Inhale 1 puff into the lungs 2 (two) times daily. 08/15/17  Yes [provider]  atorvastatin (LIPITOR) 40 MG tablet Take 1 tablet (40 mg total) by mouth daily at 6 PM. 07/29/17  Yes Isaac Bliss, Rayford Halsted, MD  aspirin 325 MG tablet Take 1 tablet (325 mg total) by mouth daily. 07/30/17   Isaac Bliss, Rayford Halsted, MD  mometasone-formoterol [facility]) 200-5 MCG/ACT AERO Inhale 2 puffs into the lungs 2 (two) times daily. 07/29/17   Isaac Bliss, Rayford Halsted, MD  thiamine (VITAMIN B-1) 100 MG tablet Take 1 tablet (100 mg total) by mouth daily. 08/01/17   Isaac Bliss, Rayford Halsted, MD   Allergies  Allergen Reactions  . Cephalexin Itching  . Codeine Anxiety   Review of Systems  Unable to perform ROS: Mental status change    Physical Exam  Constitutional: He appears ill.  HENT:  Head: Atraumatic.  Cardiovascular: Normal rate.  Pulmonary/Chest: Effort normal. No respiratory distress.  Abdominal: Soft. He exhibits no distension.  Musculoskeletal:       Right lower leg: He exhibits no edema.       Left lower leg: He exhibits no edema.  Neurological: He is alert.  Oriented to self only  Skin: Skin is warm and dry.  Psychiatric:  Calm and cooperative  Nursing note and vitals  reviewed.   Vital Signs: BP 103/71 (BP Location: Left Arm)   Pulse 86   Temp 99.8 F (37.7 C) (Oral)   Resp 19   Ht 5\' 8"  (1.727 m)   Wt 81.6 kg (180 lb)   SpO2 99%   BMI 27.37 kg/m  Pain Scale: PAINAD   Pain Score: 0-No pain   SpO2: SpO2: 99 % O2 Device:SpO2: 99 % O2 Flow Rate: .O2 Flow Rate (L/min): 2 L/min  IO: Intake/output summary:   Intake/Output Summary (Last 24 hours) at 08/17/2017 1347 Last data filed at 08/17/2017 1100 Gross per 24 hour  Intake 662.5 ml  Output 450 ml  Net 212.5 ml    LBM: Last BM Date: 08/16/17 Baseline Weight: Weight: 81.6 kg (180 lb) Most recent weight: Weight: 81.6 kg (180 lb)     Palliative Assessment/Data:   Flowsheet Rows     Most Recent Value  Intake Tab  Referral Department  Hospitalist  Unit at Time of Referral  Cardiac/Telemetry Unit  Palliative Care Primary Diagnosis  Pulmonary  Date Notified  08/17/17  Palliative Care Type  Return patient Palliative Care  Reason for referral  Clarify Goals of Care  Date of Admission  08/16/17  Date first seen by Palliative Care  08/17/17  # of days Palliative referral response time  0 Day(s)  # of days IP prior to Palliative referral  1  Clinical Assessment  Palliative Performance Scale Score  30%  Pain Max last 24 hours  Not able to report  Pain Min Last 24 hours  Not able to report  Dyspnea Max Last 24 Hours  Not able to report  Dyspnea Min Last 24 hours  Not able to report  Psychosocial & Spiritual Assessment  Palliative Care Outcomes      Time In: 1120 Time Out: 1230 Time Total: 70 minutes Greater than 50%  of this time was spent counseling and coordinating care related to the above  assessment and plan.  Signed by: Drue Novel, NP   Please contact Palliative Medicine Team phone at [phone] for questions and concerns.  For individual provider: See Shea Evans

## 2017-08-17 NOTE — ED Provider Notes (Signed)
[facility] EMERGENCY DEPARTMENT Provider Note   CSN: [id number] Arrival date & time: 08/16/17  2003     History   Chief Complaint Chief Complaint  Patient presents with  . Shortness of Breath    "coughed up blood"    HPI Melvin Hart is a 72 y.o. male.  The history is provided by the EMS personnel and medical records.  Shortness of Breath  Associated symptoms include cough. He has tried nothing for the symptoms.  Cough  Associated symptoms include shortness of breath.    Past Medical History:  Diagnosis Date  . COPD (chronic obstructive pulmonary disease) ([place]) 09/22/2010  . CVA (cerebral vascular accident) ([place])   . 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 History of Gout 10/12/2010  . History of Venereal disease 10/12/2010  . Hypertension     Patient Active Problem List   Diagnosis Date Noted  . HCAP (healthcare-associated pneumonia) 08/17/2017  . Cerebrovascular accident (CVA) ([place])   . Palliative care encounter   . Lung mass 07/25/2017  . Pleural effusion 07/25/2017  . History of Venereal disease 10/12/2010  . History of Gout 10/12/2010  . 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 COPD (chronic obstructive pulmonary disease) ([place]) 09/22/2010    Past Surgical History:  Procedure Laterality Date  . INCISE AND DRAIN ABCESS    . LYMPH NODE BIOPSY          Home Medications    Prior to Admission medications   Medication Sig Start Date End Date Taking? Authorizing Provider  aspirin 325 MG tablet Take 1 tablet (325 mg total) by mouth daily. 07/30/17   Isaac Bliss, Rayford Halsted, MD  atorvastatin (LIPITOR) 40 MG tablet Take 1 tablet (40 mg total) by mouth daily at 6 PM. 07/29/17   Isaac Bliss, Rayford Halsted, MD  mometasone-formoterol [facility]) 200-5 MCG/ACT AERO Inhale 2 puffs into the lungs 2 (two) times daily. 07/29/17   Isaac Bliss, Rayford Halsted, MD  thiamine (VITAMIN B-1) 100 MG tablet Take 1 tablet (100 mg total) by mouth daily.  08/01/17   Isaac Bliss, Rayford Halsted, MD    Family History No family history on file.  Social History Social History   Tobacco Use  . Smoking status: Current Every Day Smoker    Packs/day: 1.00    Types: Cigarettes  . Smokeless tobacco: Never Used  Substance Use Topics  . Alcohol use: Not Currently    Comment: daily-quit 1 month ago 07/25/17  . Drug use: No     Allergies   Cephalexin and Codeine   Review of Systems Review of Systems  Unable to perform ROS: Mental status change  Respiratory: Positive for cough and shortness of breath.   Psychiatric/Behavioral: Positive for confusion.     Physical Exam Updated Vital Signs BP (!) 107/50   Pulse 88   Temp (!) 101.3 F (38.5 C) (Rectal)   Resp (!) 24   Ht 5\' 8"  (1.727 m)   Wt 81.6 kg (180 lb)   SpO2 100%   BMI 27.37 kg/m   Physical Exam  Constitutional: He appears well-developed and well-nourished.  HENT:  Head: Normocephalic and atraumatic.  Eyes: Pupils are equal, round, and reactive to light. EOM are normal.  Neck: Normal range of motion.  Cardiovascular: Normal rate.  Pulmonary/Chest: Effort normal. No respiratory distress. He has decreased breath sounds. He has wheezes.  Abdominal: Soft. He exhibits no distension. There is no tenderness.  Musculoskeletal: Normal range of motion.  Neurological: He  is alert.  Skin: Skin is warm and dry.  Nursing note and vitals reviewed.    ED Treatments / Results  Labs (all labs ordered are listed, but only abnormal results are displayed) Labs Reviewed  CBC WITH DIFFERENTIAL/PLATELET - Abnormal; Notable for the following components:      Result Value   WBC 11.9 (*)    RBC 3.24 (*)    Hemoglobin 8.0 (*)    HCT 26.0 (*)    MCH 24.7 (*)    RDW 16.3 (*)    Neutro Abs 8.6 (*)    All other components within normal limits  COMPREHENSIVE METABOLIC PANEL - Abnormal; Notable for the following components:   Chloride 100 (*)    Glucose, Bld 114 (*)    Calcium 8.0 (*)      Albumin 2.1 (*)    AST 47 (*)    All other components within normal limits  CULTURE, BLOOD (ROUTINE X 2)  CULTURE, BLOOD (ROUTINE X 2)  URINE CULTURE  URINALYSIS, ROUTINE W REFLEX MICROSCOPIC    EKG EKG Interpretation  Date/Time:  Tuesday August 16 2017 20:16:51 EDT Ventricular Rate:  90 PR Interval:    QRS Duration: 93 QT Interval:  370 QTC Calculation: 453 R Axis:   65 Text Interpretation:  Sinus rhythm Artifact in lead(s) I III aVL aVF V5 V6 Confirmed by Merrily Pew [id number]) on 08/16/2017 8:32:31 PM   Radiology Dg Chest 2 View  Result Date: 08/16/2017 CLINICAL DATA:  Shortness of breath. EXAM: CHEST - 2 VIEW COMPARISON:  Chest x-ray dated Jul 26, 2017. FINDINGS: Unchanged loculated right pleural effusion with volume loss in the right hemithorax and atelectasis in the right lower lobe. Persistent rightward shift of the mediastinum. The left lung is clear. No pneumothorax. Stable cardiomediastinal silhouette. No acute osseous abnormality. IMPRESSION: 1. Unchanged loculated right pleural effusion and volume loss in the right hemithorax. Electronically Signed   By: Titus Dubin M.D.   On: 08/16/2017 21:34    Procedures Procedures (including critical care time)  Medications Ordered in ED Medications  albuterol (PROVENTIL) (2.5 MG/3ML) 0.083% nebulizer solution 5 mg (5 mg Nebulization Given 08/16/17 2043)  levofloxacin (LEVAQUIN) IVPB 750 mg (0 mg Intravenous Stopped 08/17/17 0049)     Initial Impression / Assessment and Plan / ED Course  I have reviewed the triage vital signs and the nursing notes.  Pertinent labs & imaging results that were available during my care of the patient were reviewed by me and considered in my medical decision making (see chart for details).     Hemoptysis, loculated pleural effusion, hypoxia, confusion. Treated for possible pneumonia. Will admit for same.   Final Clinical Impressions(s) / ED Diagnoses   Final diagnoses:  Tachypnea  Confusion   Hypoxia    ED Discharge Orders    None       Jaimeson Gopal, Corene Cornea, MD 08/17/17 0130

## 2017-08-17 NOTE — H&P (Signed)
[facility] H&P    Patient Demographics:    Melvin Hart, is a 72 y.o. male  MRN: [id number]  DOB - 03-30-1945  Admit Date - 08/16/2017  Referring MD/NP/PA: Dr. Dayna Barker  Outpatient Primary MD for the patient is Melvin Cea, MD  Patient coming from: Home  Chief complaint-coughing up blood   HPI:    Melvin Hart  is a 72 y.o. male, with history of COPD, hypertension, subacute left MCA CVA, right loculated pleural effusion, normocytic anemia was brought to hospital with complaints of coughing up blood.  Patient is confused at this time and is unable to provide any significant history.  As per nursing staff family told that patient has been having intermittent confusion since he was discharged from the hospital.  He was seen by palliative care during previous admission in May 2019 and plan was to follow-up palliative care as outpatient. In the ED patient was found to be febrile with temperature 101.3. WBC 11.9\ Patient started on vancomycin, Zosyn for healthcare associated pneumonia.    Review of systems:     All other systems reviewed and are negative.   With Past History of the following :    Past Medical History:  Diagnosis Date  . COPD (chronic obstructive pulmonary disease) ([place]) 09/22/2010  . CVA (cerebral vascular accident) ([place])   . 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 History of Gout 10/12/2010  . History of Venereal disease 10/12/2010  . Hypertension       Past Surgical History:  Procedure Laterality Date  . INCISE AND DRAIN ABCESS    . LYMPH NODE BIOPSY        Social History:      Social History   Tobacco Use  . Smoking status: Current Every Day Smoker    Packs/day: 1.00    Types: Cigarettes  . Smokeless tobacco: Never Used  Substance Use Topics  . Alcohol use: Not Currently    Comment: daily-quit 1 month ago 07/25/17       Family History :   Not obtainable  due to patient's altered mental status   Home Medications:   Prior to Admission medications   Medication Sig Start Date End Date Taking? Authorizing Provider  aspirin 325 MG tablet Take 1 tablet (325 mg total) by mouth daily. 07/30/17   Isaac Bliss, Rayford Halsted, MD  atorvastatin (LIPITOR) 40 MG tablet Take 1 tablet (40 mg total) by mouth daily at 6 PM. 07/29/17   Isaac Bliss, Rayford Halsted, MD  mometasone-formoterol [facility]) 200-5 MCG/ACT AERO Inhale 2 puffs into the lungs 2 (two) times daily. 07/29/17   Isaac Bliss, Rayford Halsted, MD  thiamine (VITAMIN B-1) 100 MG tablet Take 1 tablet (100 mg total) by mouth daily. 08/01/17   Isaac Bliss, Rayford Halsted, MD     Allergies:     Allergies  Allergen Reactions  . Cephalexin Itching  . Codeine Anxiety     Physical Exam:   Vitals  Blood pressure (!) 107/50, pulse 88, temperature (!) 101.3 F (38.5 C), temperature source Rectal, resp.  rate (!) 24, height 5\' 8"  (1.727 m), weight 81.6 kg (180 lb), SpO2 100 %.  1.  General: Pleasantly confused  2. Psychiatric: Alert, not oriented x3  3. Neurologic: No focal neurological deficits, moving all extremities  4. Eyes :  anicteric sclerae, moist conjunctivae with no lid lag. PERRLA.  5. ENMT:  Oropharynx clear with moist mucous membranes and good dentition  6. Neck:  supple, no cervical lymphadenopathy appriciated, No thyromegaly  7. Respiratory : Normal respiratory effort, decreased breath sounds on right  8. Cardiovascular : RRR, no gallops, rubs or murmurs, no leg edema  9. Gastrointestinal:  Positive bowel sounds, abdomen soft, non-tender to palpation,no hepatosplenomegaly, no rigidity or guarding       10. Skin:  No cyanosis, normal texture and turgor, no rash, lesions or ulcers  11.Musculoskeletal:  Good muscle tone,  joints appear normal , no effusions,  normal range of motion    Data Review:    CBC Recent Labs  Lab 08/16/17 2136  WBC 11.9*  HGB 8.0*  HCT  26.0*  PLT 294  MCV 80.2  MCH 24.7*  MCHC 30.8  RDW 16.3*  LYMPHSABS 2.5  MONOABS 0.7  EOSABS 0.1  BASOSABS 0.0   ------------------------------------------------------------------------------------------------------------------  Chemistries  Recent Labs  Lab 08/16/17 2136  NA 135  K 4.3  CL 100*  CO2 25  GLUCOSE 114*  BUN 16  CREATININE 0.73  CALCIUM 8.0*  AST 47*  ALT 60  ALKPHOS 114  BILITOT 0.6   ------------------------------------------------------------------------------------------------------------------  ------------------------------------------------------------------------------------------------------------------ GFR: Estimated Creatinine Clearance: 81.9 mL/min (by C-G formula based on SCr of 0.73 mg/dL). Liver Function Tests: Recent Labs  Lab 08/16/17 2136  AST 47*  ALT 60  ALKPHOS 114  BILITOT 0.6  PROT 6.8  ALBUMIN 2.1*    --------------------------------------------------------------------------------------------------------------- Urine analysis:    Component Value Date/Time   COLORURINE YELLOW 07/25/2017 1330   APPEARANCEUR CLEAR 07/25/2017 1330   LABSPEC 1.013 07/25/2017 1330   PHURINE 6.0 07/25/2017 1330   GLUCOSEU NEGATIVE 07/25/2017 1330   HGBUR SMALL (A) 07/25/2017 1330   BILIRUBINUR NEGATIVE 07/25/2017 1330   KETONESUR NEGATIVE 07/25/2017 1330   PROTEINUR 30 (A) 07/25/2017 1330   NITRITE NEGATIVE 07/25/2017 1330   LEUKOCYTESUR NEGATIVE 07/25/2017 1330      Imaging Results:    Dg Chest 2 View  Result Date: 08/16/2017 CLINICAL DATA:  Shortness of breath. EXAM: CHEST - 2 VIEW COMPARISON:  Chest x-ray dated Jul 26, 2017. FINDINGS: Unchanged loculated right pleural effusion with volume loss in the right hemithorax and atelectasis in the right lower lobe. Persistent rightward shift of the mediastinum. The left lung is clear. No pneumothorax. Stable cardiomediastinal silhouette. No acute osseous abnormality. IMPRESSION: 1.  Unchanged loculated right pleural effusion and volume loss in the right hemithorax. Electronically Signed   By: Titus Dubin M.D.   On: 08/16/2017 21:34    My personal review of EKG: Rhythm NSR   Assessment & Plan:    Active Problems:   HCAP (healthcare-associated pneumonia)   1. SIRS- ?  Healthcare associated pneumonia-patient presenting with hemoptysis, chest x-ray shows unchanged loculate right pleural effusion and volume loss in the right hemithorax.  Patient empirically started on vancomycin and Zosyn.  Follow blood culture results.  We will also obtain a UA and urine culture  2. Altered mental status-patient has been having episodes of encephalopathy since he was diagnosed with stroke a month ago.  This could be also because of the above.  He has been started on  IV antibiotics, will assess the response.  3. Hypertension-continue lisinopril  4. Anemia-patient's hemoglobin is 8.0, anemia was worked up during previous admission at that time stool guaiac was negative.  Anemia panel was obtained, and showed iron deficiency anemia.  Consider starting on iron supplementation at the time of discharge.  5. ?  Lung mass-patient has persistent right pleural effusion, he was seen by pulmonology during previous admission and they recommended CT surgery evaluation.  Palliative care was consulted for goals of care during previous admission.  Will consult palliative care again for ongoing discussion and symptom management.   DVT Prophylaxis-   SCDs  AM Labs Ordered, also please review Full Orders  Family Communication: No family at bedside  Code Status:   Full code  Admission status:  Inpatient  Time spent in minutes :  60 minutes   Oswald Hillock M.D on 08/17/2017 at 1:39 AM  Between 7am to 7pm - Pager - [phone]. After 7pm go to [email or web address] - password [facility]  [facility] - Office  [phone]

## 2017-08-18 DIAGNOSIS — J90 Pleural effusion, not elsewhere classified: Secondary | ICD-10-CM

## 2017-08-18 DIAGNOSIS — R918 Other nonspecific abnormal finding of lung field: Secondary | ICD-10-CM

## 2017-08-18 LAB — BASIC METABOLIC PANEL
Anion gap: 7 (ref 5–15)
BUN: 10 mg/dL (ref 6–20)
CHLORIDE: 103 mmol/L (ref 101–111)
CO2: 27 mmol/L (ref 22–32)
Calcium: 8.2 mg/dL — ABNORMAL LOW (ref 8.9–10.3)
Creatinine, Ser: 0.75 mg/dL (ref 0.61–1.24)
GFR calc Af Amer: >60 mL/min (ref >60)
GFR calc non Af Amer: >60 mL/min (ref >60)
GLUCOSE: 108 mg/dL — AB (ref 65–99)
Potassium: 3.8 mmol/L (ref 3.5–5.1)
SODIUM: 137 mmol/L (ref 135–145)

## 2017-08-18 LAB — URINE CULTURE: CULTURE: NO GROWTH

## 2017-08-18 NOTE — Clinical Social Work Note (Signed)
Patient recently admitted and discharged on 08/01/17 to [facility].  LCSW to follow up with family to identify plan.   See complete assessment below from 07/27/17: Late entry for 07/27/17 Clinical Social Work Assessment  Patient Details  Name: Melvin Hart MRN: [id number] Date of Birth: 1946-03-04  Date of referral:  07/27/17               Reason for consult:  Facility Placement                 Permission sought to share information with:    Permission granted to share information::                Name::                   Agency::                Relationship::                Contact Information:  Melvin Hart, brother  Housing/Transportation Living arrangements for the past 2 months:  Single Family Home Source of Information:  Siblings Patient Interpreter Needed:  None Criminal Activity/Legal Involvement Pertinent to Current Situation/Hospitalization:  No - Comment as needed Significant Relationships:  Siblings, Other Family Members Lives with:  Siblings, Other (Comment)(cousin, brother's girlfriend) Do you feel safe going back to the place where you live?  Yes Need for family participation in patient care:  Yes (Comment)  Care giving concerns: None identified at baseline.    Social Worker assessment / plan:  Patient's brother, Melvin Hart, provided history. Patient's brother, brother's girlfriend and cousin live with patient. At baseline he ambulates independently and completes ADLs unassisted. He states that patient does not bathe and that this is common with people with dementia. LCSW discussed with Melvin PT's STR SNF recommendation. Melvin stated that he would speak to the rest of the family and contact LCSW with decision. LCSW emailed Melvin a SNF list.   Employment status:  Retired Nurse, adult PT Recommendations:  [name] / Referral to community resources:  [place]  Patient/Family's Response to  care:  Family is considering STR at [employer].   Patient/Family's Understanding of and Emotional Response to Diagnosis, Current Treatment, and Prognosis:  Family understands patient's diagnosis, treatment and prognosis and feel patient can be best served at [facility].   Emotional Assessment Appearance:  Appears stated age Attitude/Demeanor/Rapport:    Affect (typically observed):  Unable to Assess Orientation:  Oriented to Self Alcohol / Substance use:  Not Applicable Psych involvement (Current and /or in the community):  No (Comment)  Discharge Needs  Concerns to be addressed:  Discharge Planning Concerns Readmission within the last 30 days:  No Current discharge risk:  None Barriers to Discharge:  Insurance Authorization   Juanda Bond 07/28/2017, 9:01 AM           Electronically signed by Ihor Gully, LCSW at 07/28/2017 9:13 AM

## 2017-08-18 NOTE — Progress Notes (Signed)
Palliative: Melvin Hart, Melvin Hart, is resting quietly in bed.  He greets me making and keeping eye contact.  Present today at bedside is his brother Melvin Hart who lives in Melvin Hart's home and helps with his care.  Melvin Hart has brought HCPOA/AD paperwork which has been placed on Melvin Hart chart.  I asked orientation questions in front of Melvin Hart.  Melvin Hart is oriented to self only again today.  We talked about disposition plan.  I ask just see if Melvin Hart would qualify for Medicaid.  He shares that Melvin Hart applied 2 or 3 years ago and was denied due to too much income.  Melvin Hart states that he received Medicaid.  We talked about SNF as a resident.  Melvin Hart states that at this point, he believes they can care for Melvin Hart at home.  They had previously had the benefit of advanced home health services.  Melvin Hart states that home health services came on Friday, and [place] was admitted on Monday.  I share the benefit of rehab at SNF again, a bridge to help him improve as much as possible.  Melvin Hart asks if this would cost.  I shared that he would have a co-pay as he has not been at home for the needed time.  No further questions at this time. Call to brother Melvin Hart.  Left detailed message related to plan of care, treatment plan, disposition questions. 11 minutes Melvin Axe, NP Palliative Medicine Team Team Phone # [phone]

## 2017-08-18 NOTE — NC FL2 (Signed)
[place] LEVEL OF CARE SCREENING TOOL     IDENTIFICATION  Patient Name: Melvin Hart Birthdate: May 07, 1945 Sex: male Admission Date (Current Location): 08/16/2017  [facility] and [place] Number:  [employer] and Address:  [facility] [address] [place]      Provider Number: [id number]  Attending Physician Name and Address:  Murlean Iba, MD  Relative Name and Phone Number:       Current Level of Care: Hospital Recommended Level of Care: [place] Prior Approval Number:    Date Approved/Denied:   PASRR Number: [id number]  Discharge Plan: SNF    Current Diagnoses: Patient Active Problem List   Diagnosis Date Noted  . HCAP (healthcare-associated pneumonia) 08/17/2017  . Acute metabolic encephalopathy 50/35/4656  . Confusion   . Hypoxia   . Palliative care by specialist   . Goals of care, counseling/discussion   . DNR (do not resuscitate) discussion   . Cerebrovascular accident (CVA) ([place])   . Palliative care encounter   . Lung mass 07/25/2017  . Pleural effusion 07/25/2017  . History of Venereal disease 10/12/2010  . History of Gout 10/12/2010  . 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 COPD (chronic obstructive pulmonary disease) ([place]) 09/22/2010    Orientation RESPIRATION BLADDER Height & Weight     Self  O2(2L) Continent Weight: 180 lb (81.6 kg) Height:  5\' 8"  (172.7 cm)  BEHAVIORAL SYMPTOMS/MOOD NEUROLOGICAL BOWEL NUTRITION STATUS      Continent (Diet Soft)  AMBULATORY STATUS COMMUNICATION OF NEEDS Skin   Limited Assist Verbally Normal                       Personal Care Assistance Level of Assistance    Bathing Assistance: Limited assistance Feeding assistance: Limited assistance Dressing Assistance: Limited assistance     Functional Limitations Info  Sight, Hearing, Speech Sight Info: Adequate Hearing Info: Adequate Speech Info: Adequate     SPECIAL CARE FACTORS FREQUENCY                       Contractures Contractures Info: Not present    Additional Factors Info  Code Status, Allergies Code Status Info: Full Code Allergies Info: Cephalexin, Codeine           Current Medications (08/18/2017):  This is the current hospital active medication list Current Facility-Administered Medications  Medication Dose Route Frequency Provider Last Rate Last Dose  . 0.9 %  sodium chloride infusion   Intravenous Continuous Johnson, Clanford L, MD 45 mL/hr at 08/18/17 0500    . acetaminophen (TYLENOL) tablet 650 mg  650 mg Oral Q6H PRN Johnson, Clanford L, MD      . haloperidol lactate (HALDOL) injection 2 mg  2 mg Intravenous Q6H PRN Johnson, Clanford L, MD      . ondansetron (ZOFRAN) tablet 4 mg  4 mg Oral Q6H PRN Oswald Hillock, MD       Or  . ondansetron (ZOFRAN) injection 4 mg  4 mg Intravenous Q6H PRN Oswald Hillock, MD      . piperacillin-tazobactam (ZOSYN) IVPB 3.375 g  3.375 g Intravenous Q8H Oswald Hillock, MD   Stopped at 08/18/17 1003  . QUEtiapine (SEROQUEL) tablet 25 mg  25 mg Oral QHS Johnson, Clanford L, MD   25 mg at 08/17/17 2131  . vancomycin (VANCOCIN) IVPB 1000 mg/200 mL premix  1,000 mg Intravenous Q12H Johnson,  Clanford L, MD   Stopped at 08/18/17 0420     Discharge Medications: Please see discharge summary for a list of discharge medications.  Relevant Imaging Results:  Relevant Lab Results:   Additional Information SSN [id number] Clydene Pugh, LCSW

## 2017-08-18 NOTE — Progress Notes (Signed)
PROGRESS NOTE    Melvin Hart  YTK:[id number]  DOB: [birth year]  DOA: 08/16/2017 PCP: Henreitta Cea, MD   Brief Admission Hx:   Melvin Hart  is a 72 y.o. male, with history of COPD, hypertension, subacute left MCA CVA, right loculated pleural effusion, normocytic anemia was brought to hospital with complaints of coughing up blood.  Patient is confused at this time and is unable to provide any significant history.  As per nursing staff family told that patient has been having intermittent confusion since he was discharged from the hospital.  He was seen by palliative care during previous admission in May 2019 and plan was to follow-up palliative care as outpatient. In the ED patient was found to be febrile with temperature 101.3. WBC 11.9.  Patient started on vancomycin, Zosyn for healthcare associated pneumonia.  MDM/Assessment & Plan:   1. HCAP - The patient is improving clinically with no more fevers and better oxygenation.  His mentation has not improved and likely this is his baseline.  He is only oriented to self.  Planning to de-escalate antibiotics 6/7 as he is clinically improving.  Continue supportive therapy.   2. Altered mental status - He is likely at his baseline after discussion with family.  He is only oriented to self.  Continue to monitor.   3. Hypertension - stable, following.  His blood pressures have been controlled to softer.   4. Pleural effusion - Unable to obtain thoracentesis, IR attempted last month but was unable to reach pocket.  Discussions with family reveal that they agree that surgery is too risky for patient given his frailty and the patient had expressed in the past that he did not want surgery for this.   5. Anemia of chronic disease - continue to monitor, has been stable.    DVT prophylaxis: SCDs Code Status: Full  Family Communication: via palliative medicine team Disposition Plan: Family prefers to take patient  home   Consultants:  Palliative medicine  Subjective: Pt voices no complaints today.    Objective: Vitals:   08/17/17 2043 08/17/17 2246 08/18/17 0644 08/18/17 1322  BP:  108/64 (!) 114/51 (!) 98/58  Pulse:  83 79 83  Resp:  20 16 18   Temp:  98.9 F (37.2 C) 97.8 F (36.6 C) 98.6 F (37 C)  TempSrc:  Oral Oral Oral  SpO2: 93% 97% 99% 99%  Weight:      Height:        Intake/Output Summary (Last 24 hours) at 08/18/2017 1513 Last data filed at 08/18/2017 1510 Gross per 24 hour  Intake 3170.25 ml  Output 1050 ml  Net 2120.25 ml   Filed Weights   08/16/17 2012  Weight: 81.6 kg (180 lb)     REVIEW OF SYSTEMS  As per history otherwise all reviewed and reported negative  Exam:  General exam: elderly chronically ill appearing male, confused. Oriented only to self.  Respiratory system: diminished BS RUL. No increased work of breathing. Cardiovascular system: S1 & S2 heard, RRR. No JVD, murmurs, gallops, clicks or pedal edema. Gastrointestinal system: Abdomen is nondistended, soft and nontender. Normal bowel sounds heard. Central nervous system: Alert and oriented x1. No focal neurological deficits. Extremities: no CCE.  Data Reviewed: Basic Metabolic Panel: Recent Labs  Lab 08/16/17 2136 08/17/17 0448 08/18/17 0432  NA 135 135 137  K 4.3 4.1 3.8  CL 100* 100* 103  CO2 25 28 27   GLUCOSE 114* 111* 108*  BUN 16 13 10   CREATININE  0.73 0.81 0.75  CALCIUM 8.0* 8.0* 8.2*   Liver Function Tests: Recent Labs  Lab 08/16/17 2136 08/17/17 0448  AST 47* 43*  ALT 60 55  ALKPHOS 114 100  BILITOT 0.6 0.5  PROT 6.8 6.1*  ALBUMIN 2.1* 1.9*   No results for input(s): LIPASE, AMYLASE in the last 168 hours. No results for input(s): AMMONIA in the last 168 hours. CBC: Recent Labs  Lab 08/16/17 2136 08/17/17 0448  WBC 11.9* 12.1*  NEUTROABS 8.6*  --   HGB 8.0* 8.1*  HCT 26.0* 25.2*  MCV 80.2 80.3  PLT 294 322   Cardiac Enzymes: No results for input(s):  CKTOTAL, CKMB, CKMBINDEX, TROPONINI in the last 168 hours. CBG (last 3)  No results for input(s): GLUCAP in the last 72 hours. Recent Results (from the past 240 hour(s))  Culture, Urine     Status: None   Collection Time: 08/17/17  1:30 AM  Result Value Ref Range Status   Specimen Description   Final    URINE, CLEAN CATCH Performed at [facility], [address] [place], [place] [address]    Special Requests   Final    NONE Performed at [facility], [address], [place], [place] [address]    Culture   Final    NO GROWTH Performed at [facility], [place] [address], [place], [place] [address]    Report Status 08/18/2017 FINAL  Final  Culture, blood (Routine X 2) w Reflex to ID Panel     Status: None (Preliminary result)   Collection Time: 08/17/17  1:33 AM  Result Value Ref Range Status   Specimen Description BLOOD RIGHT ARM  Final   Special Requests   Final    BOTTLES DRAWN AEROBIC AND ANAEROBIC Blood Culture adequate volume   Culture   Final    NO GROWTH < 12 HOURS Performed at [facility], [address] [place], [place] [address]    Report Status PENDING  Incomplete  Culture, blood (Routine X 2) w Reflex to ID Panel     Status: None (Preliminary result)   Collection Time: 08/17/17  1:36 AM  Result Value Ref Range Status   Specimen Description BLOOD RIGHT ARM  Final   Special Requests   Final    BOTTLES DRAWN AEROBIC AND ANAEROBIC Blood Culture adequate volume   Culture   Final    NO GROWTH < 12 HOURS Performed at [facility], [address], [place], [place] [address]    Report Status PENDING  Incomplete     Studies: Dg Chest 2 View  Result Date: 08/16/2017 CLINICAL DATA:  Shortness of breath. EXAM: CHEST - 2 VIEW COMPARISON:  Chest x-ray dated Jul 26, 2017. FINDINGS: Unchanged loculated right pleural effusion with volume loss in the right hemithorax and atelectasis in the right lower lobe. Persistent rightward shift of the mediastinum. The left lung is clear.  No pneumothorax. Stable cardiomediastinal silhouette. No acute osseous abnormality. IMPRESSION: 1. Unchanged loculated right pleural effusion and volume loss in the right hemithorax. Electronically Signed   By: Titus Dubin M.D.   On: 08/16/2017 21:34     Scheduled Meds: . QUEtiapine  25 mg Oral QHS   Continuous Infusions: . sodium chloride 45 mL/hr at 08/18/17 0500  . piperacillin-tazobactam (ZOSYN)  IV Stopped (08/18/17 1003)  . vancomycin 1,000 mg (08/18/17 1503)    Active Problems:   Lung mass   Pleural effusion   Cerebrovascular accident (CVA) ([place])   HCAP (healthcare-associated pneumonia)  Acute metabolic encephalopathy   Confusion   Hypoxia   Palliative care by specialist   Goals of care, counseling/discussion   DNR (do not resuscitate) discussion   Time spent:   Irwin Brakeman, MD, FAAFP [facility] Pager [phone]  If 7PM-7AM, please contact night-coverage [email or web address] Password [email or web address] 08/18/2017, 3:13 PM    LOS: 1 day

## 2017-08-19 LAB — BASIC METABOLIC PANEL
ANION GAP: 7 (ref 5–15)
BUN: 11 mg/dL (ref 6–20)
CALCIUM: 7.9 mg/dL — AB (ref 8.9–10.3)
CO2: 25 mmol/L (ref 22–32)
CREATININE: 0.83 mg/dL (ref 0.61–1.24)
Chloride: 102 mmol/L (ref 101–111)
GFR calc Af Amer: >60 mL/min (ref >60)
GLUCOSE: 104 mg/dL — AB (ref 65–99)
Potassium: 3.7 mmol/L (ref 3.5–5.1)
Sodium: 134 mmol/L — ABNORMAL LOW (ref 135–145)

## 2017-08-19 LAB — CBC WITH DIFFERENTIAL/PLATELET
BASOS ABS: 0 10*3/uL (ref 0.0–0.1)
BASOS PCT: 0 %
EOS ABS: 0.1 10*3/uL (ref 0.0–0.7)
EOS PCT: 1 %
HEMATOCRIT: 24.9 % — AB (ref 39.0–52.0)
Hemoglobin: 7.9 g/dL — ABNORMAL LOW (ref 13.0–17.0)
Lymphocytes Relative: 18 %
Lymphs Abs: 2 10*3/uL (ref 0.7–4.0)
MCH: 25.2 pg — ABNORMAL LOW (ref 26.0–34.0)
MCHC: 31.7 g/dL (ref 30.0–36.0)
MCV: 79.3 fL (ref 78.0–100.0)
MONO ABS: 0.7 10*3/uL (ref 0.1–1.0)
Monocytes Relative: 6 %
NEUTROS ABS: 8.1 10*3/uL — AB (ref 1.7–7.7)
Neutrophils Relative %: 75 %
PLATELETS: 344 10*3/uL (ref 150–400)
RBC: 3.14 MIL/uL — ABNORMAL LOW (ref 4.22–5.81)
RDW: 16.5 % — AB (ref 11.5–15.5)
WBC: 10.9 10*3/uL — ABNORMAL HIGH (ref 4.0–10.5)

## 2017-08-19 LAB — MAGNESIUM: Magnesium: 1.8 mg/dL (ref 1.7–2.4)

## 2017-08-19 LAB — VANCOMYCIN, TROUGH: Vancomycin Tr: 16 ug/mL (ref 15–20)

## 2017-08-19 MED ORDER — DOXYCYCLINE HYCLATE 100 MG PO TABS
100.0000 mg | ORAL_TABLET | Freq: Two times a day (BID) | ORAL | Status: DC
  Administered 2017-08-19 – 2017-08-20 (×3): 100 mg via ORAL
  Filled 2017-08-19 (×3): qty 1

## 2017-08-19 NOTE — Clinical Social Work Note (Signed)
Patient is going home. LCSW signing off.     Kerney Hopfensperger, Clydene Pugh, LCSW r

## 2017-08-19 NOTE — Care Management (Signed)
Family elects to take patient home with continuation of home health services. Juliann Pulse of [facility] notified. Resumption orders placed. Anticipate weekend DC.

## 2017-08-19 NOTE — Care Management Important Message (Signed)
Important Message  Patient Details  Name: Melvin Hart MRN: [id number] Date of Birth: September 17, 1945   Medicare Important Message Given:  Yes    Shelda Altes 08/19/2017, 12:19 PM

## 2017-08-19 NOTE — Progress Notes (Signed)
Palliative: Mr. Melvin Hart, Melvin Hart, is lying in bed with aids at bedside attending to bathing needs.  He remains oriented to self only, aids state he did not remember eating breakfast.  No family present at bedside at this time. Conference with hospitalist related to plan of care and disposition. Call to brother/HC POA, Melvin Hart.  Left detailed voice message stating expect to possibly discharge this weekend.  At this point, per brother Melvin Hart, Mr. Melvin Hart will return to his home.  I encourage staying to reach out to social work in house if this is incorrect and family is considering SNF residential placement as he would not likely qualify for further rehab. 25 minutes Quinn Axe, NP Palliative Medicine Team Team Phone # [phone]

## 2017-08-19 NOTE — Progress Notes (Signed)
PROGRESS NOTE  TARIN NAVAREZ  SEG:[id number]  DOB: 11-Jun-1945  DOA: 08/16/2017 PCP: Henreitta Cea, MD   Brief Admission Hx:   Melvin Hart  is a 72 y.o. male, with history of COPD, hypertension, subacute left MCA CVA, right loculated pleural effusion, normocytic anemia was brought to hospital with complaints of coughing up blood.  Patient is confused at this time and is unable to provide any significant history.  As per nursing staff family told that patient has been having intermittent confusion since he was discharged from the hospital.  He was seen by palliative care during previous admission in May 2019 and plan was to follow-up palliative care as outpatient. In the ED patient was found to be febrile with temperature 101.3. WBC 11.9.  Patient started on vancomycin, Zosyn for healthcare associated pneumonia.  MDM/Assessment & Plan:   1. HCAP - The patient is improving clinically with no more fevers and better oxygenation.  His mentation has not improved and likely this is his baseline.  He is only oriented to self.  De-escalate antibiotics today.   Continue supportive therapy.   2. Altered mental status - He is likely at his baseline after discussion with family.  He is only oriented to self.  Continue to monitor.   3. Hypertension - stable, following.  His blood pressures have been controlled.   4. Pleural effusion -  Discussions with family reveal that they agree that surgery is too risky for patient given his frailty and the patient had expressed in the past that he did not want surgery for this.   5. Anemia of chronic disease - continue to monitor, has been stable.    DVT prophylaxis: SCDs Code Status: Full  Family Communication: via palliative medicine team Disposition Plan: Family prefers to take patient home, anticipate weekend discharge   Consultants:  Palliative medicine  Subjective: Pt eating better today.    Objective: Vitals:   08/18/17 1949 08/18/17 2043  08/19/17 0549 08/19/17 1442  BP:  (!) 108/55 97/62 (!) 91/57  Pulse:  88 79 85  Resp:  18 15 17   Temp:  99.1 F (37.3 C) 98.6 F (37 C) 98.7 F (37.1 C)  TempSrc:  Oral Oral Oral  SpO2: 98% 99% 90% 98%  Weight:      Height:        Intake/Output Summary (Last 24 hours) at 08/19/2017 1541 Last data filed at 08/19/2017 1300 Gross per 24 hour  Intake 720 ml  Output -  Net 720 ml   Filed Weights   08/16/17 2012  Weight: 81.6 kg (180 lb)     REVIEW OF SYSTEMS  As per history otherwise all reviewed and reported negative  Exam:  General exam: elderly chronically ill appearing male, confused. Oriented only to self.  Respiratory system: diminished BS RUL. No increased work of breathing. Cardiovascular system: S1 & S2 heard, RRR. No JVD, murmurs, gallops, clicks or pedal edema. Gastrointestinal system: Abdomen is nondistended, soft and nontender. Normal bowel sounds heard. Central nervous system: Alert and oriented x1. No focal neurological deficits. Extremities: no CCE.  Data Reviewed: Basic Metabolic Panel: Recent Labs  Lab 08/16/17 2136 08/17/17 0448 08/18/17 0432 08/19/17 0305  NA 135 135 137 134*  K 4.3 4.1 3.8 3.7  CL 100* 100* 103 102  CO2 25 28 27 25   GLUCOSE 114* 111* 108* 104*  BUN 16 13 10 11   CREATININE 0.73 0.81 0.75 0.83  CALCIUM 8.0* 8.0* 8.2* 7.9*  MG  --   --   --  1.8   Liver Function Tests: Recent Labs  Lab 08/16/17 2136 08/17/17 0448  AST 47* 43*  ALT 60 55  ALKPHOS 114 100  BILITOT 0.6 0.5  PROT 6.8 6.1*  ALBUMIN 2.1* 1.9*   No results for input(s): LIPASE, AMYLASE in the last 168 hours. No results for input(s): AMMONIA in the last 168 hours. CBC: Recent Labs  Lab 08/16/17 2136 08/17/17 0448 08/19/17 0305  WBC 11.9* 12.1* 10.9*  NEUTROABS 8.6*  --  8.1*  HGB 8.0* 8.1* 7.9*  HCT 26.0* 25.2* 24.9*  MCV 80.2 80.3 79.3  PLT 294 322 344   Cardiac Enzymes: No results for input(s): CKTOTAL, CKMB, CKMBINDEX, TROPONINI in the last 168  hours. CBG (last 3)  No results for input(s): GLUCAP in the last 72 hours. Recent Results (from the past 240 hour(s))  Culture, Urine     Status: None   Collection Time: 08/17/17  1:30 AM  Result Value Ref Range Status   Specimen Description   Final    URINE, CLEAN CATCH Performed at [facility], [address] [place], [place] [address]    Special Requests   Final    NONE Performed at [facility], [address] [place], [place] [address]    Culture   Final    NO GROWTH Performed at [facility], [place] [address], [place], [place] [address]    Report Status 08/18/2017 FINAL  Final  Culture, blood (Routine X 2) w Reflex to ID Panel     Status: None (Preliminary result)   Collection Time: 08/17/17  1:33 AM  Result Value Ref Range Status   Specimen Description BLOOD RIGHT ARM  Final   Special Requests   Final    BOTTLES DRAWN AEROBIC AND ANAEROBIC Blood Culture adequate volume   Culture   Final    NO GROWTH 2 DAYS Performed at [facility], [address], [place], [place] [address]    Report Status PENDING  Incomplete  Culture, blood (Routine X 2) w Reflex to ID Panel     Status: None (Preliminary result)   Collection Time: 08/17/17  1:36 AM  Result Value Ref Range Status   Specimen Description BLOOD RIGHT ARM  Final   Special Requests   Final    BOTTLES DRAWN AEROBIC AND ANAEROBIC Blood Culture adequate volume   Culture   Final    NO GROWTH 2 DAYS Performed at [facility], [address], [place], [place] [address]    Report Status PENDING  Incomplete     Studies: No results found.   Scheduled Meds: . doxycycline  100 mg Oral Q12H  . QUEtiapine  25 mg Oral QHS   Continuous Infusions: . sodium chloride 10 mL/hr at 08/19/17 1000    Active Problems:   Lung mass   Pleural effusion   Cerebrovascular accident (CVA) ([place])   HCAP (healthcare-associated pneumonia)   Acute metabolic encephalopathy   Confusion   Hypoxia   Palliative care by specialist    Goals of care, counseling/discussion   DNR (do not resuscitate) discussion   Time spent:   Irwin Brakeman, MD, FAAFP [facility] Pager [phone]  If 7PM-7AM, please contact night-coverage [email or web address] Password [email or web address] 08/19/2017, 3:41 PM    LOS: 2 days

## 2017-08-20 ENCOUNTER — Ambulatory Visit (INDEPENDENT_AMBULATORY_CARE_PROVIDER_SITE_OTHER): Payer: Medicare HMO

## 2017-08-20 DIAGNOSIS — I639 Cerebral infarction, unspecified: Secondary | ICD-10-CM | POA: Diagnosis not present

## 2017-08-20 DIAGNOSIS — I499 Cardiac arrhythmia, unspecified: Secondary | ICD-10-CM | POA: Diagnosis not present

## 2017-08-20 MED ORDER — IPRATROPIUM-ALBUTEROL 0.5-2.5 (3) MG/3ML IN SOLN
3.0000 mL | Freq: Three times a day (TID) | RESPIRATORY_TRACT | Status: DC
  Administered 2017-08-20: 3 mL via RESPIRATORY_TRACT
  Filled 2017-08-20: qty 3

## 2017-08-20 MED ORDER — DOXYCYCLINE HYCLATE 100 MG PO TABS: 100.0000 mg | ORAL_TABLET | Freq: Two times a day (BID) | ORAL | 0 refills | Status: AC

## 2017-08-20 MED ORDER — QUETIAPINE FUMARATE 25 MG PO TABS: 25.0000 mg | ORAL_TABLET | Freq: Every day | ORAL | 0 refills | Status: AC

## 2017-08-20 MED ORDER — TRIAMCINOLONE ACETONIDE 0.5 % EX CREA
TOPICAL_CREAM | Freq: Three times a day (TID) | CUTANEOUS | Status: DC | PRN
  Filled 2017-08-20: qty 15

## 2017-08-20 MED ORDER — TRIAMCINOLONE ACETONIDE 0.5 % EX CREA: TOPICAL_CREAM | CUTANEOUS | 0 refills | Status: AC

## 2017-08-20 MED ORDER — MOMETASONE FURO-FORMOTEROL FUM 200-5 MCG/ACT IN AERO
2.0000 | INHALATION_SPRAY | Freq: Two times a day (BID) | RESPIRATORY_TRACT | Status: DC
  Administered 2017-08-20: 2 via RESPIRATORY_TRACT
  Filled 2017-08-20: qty 8.8

## 2017-08-20 NOTE — Discharge Instructions (Signed)
Follow with Primary MD  Henreitta Cea, MD  and other consultant's as instructed your Hospitalist MD  Please get a complete blood count and chemistry panel checked by your Primary MD at your next visit, and again as instructed by your Primary MD.  Get Medicines reviewed and adjusted: Please take all your medications with you for your next visit with your Primary MD  Laboratory/radiological data: Please request your Primary MD to go over all hospital tests and procedure/radiological results at the follow up, please ask your Primary MD to get all Hospital records sent to his/her office.  In some cases, they will be blood work, cultures and biopsy results pending at the time of your discharge. Please request that your primary care M.D. follows up on these results.  Also Note the following: If you experience worsening of your admission symptoms, develop shortness of breath, life threatening emergency, suicidal or homicidal thoughts you must seek medical attention immediately by calling 911 or calling your MD immediately  if symptoms less severe.  You must read complete instructions/literature along with all the possible adverse reactions/side effects for all the Medicines you take and that have been prescribed to you. Take any new Medicines after you have completely understood and accpet all the possible adverse reactions/side effects.   Do not drive when taking Pain medications or sleeping medications (Benzodaizepines)  Do not take more than prescribed Pain, Sleep and Anxiety Medications. It is not advisable to combine anxiety,sleep and pain medications without talking with your primary care practitioner  Special Instructions: If you have smoked or chewed Tobacco  in the last 2 yrs please stop smoking, stop any regular Alcohol  and or any Recreational drug use.  Wear Seat belts while driving.  Please note: You were cared for by a hospitalist during your hospital stay. Once you are discharged,  your primary care physician will handle any further medical issues. Please note that NO REFILLS for any discharge medications will be authorized once you are discharged, as it is imperative that you return to your primary care physician (or establish a relationship with a primary care physician if you do not have one) for your post hospital discharge needs so that they can reassess your need for medications and monitor your lab values.     Healthcare-Associated Pneumonia Healthcare-associated pneumonia is a lung infection that a person can get when in a health care setting or during certain procedures. The infection causes air sacs inside the lungs to fill with pus or fluid. Healthcare-associated pneumonia is usually caused by bacteria that are common in health care settings. These bacteria may be resistant to some antibiotic medicines. What are the causes? This condition is caused by bacteria that get into your lungs. You can get this condition if you:  Breathe in droplets from an infected person's cough or sneeze.  Touch something that an infected person coughed or sneezed on and then touch your mouth, nose, or eyes.  Have a bacterial infection somewhere else in your body, if the bacteria spread to your lungs through your blood.  What increases the risk? This condition is more likely to develop in people who:  Have a disease that weakens their body's defense system (immune system) or their ability to cough out germs.  Are older than age 21.  Having trouble swallowing.  Use a feeding or breathing tube.  Have a cold or the flu.  Have an IV tube inserted in a vein.  Have surgery.  Have a bed sore.  Live in a long-term care facility, such as a nursing home.  Were in the hospital for two or more days in the past 3 months.  Received hemodialysis in the past 30 days.  What are the signs or symptoms? Symptoms of this condition include:  Fever.  Chills.  Cough.  Shortness  of breath.  Wheezing or crackling sounds when breathing.  How is this diagnosed? This condition may be diagnosed based on:  Your symptoms.  A chest X-ray.  A measurement of the amount of oxygen in your blood.  How is this treated? This condition is treated with antibiotics. Your health care provider may take a sample of cells (culture) from your throat to determine what type of bacteria is in your lungs and change your antibiotic based on the results. If you have bacteria in your blood, trouble breathing, or a low oxygen level, you may need to be treated at the hospital. At the hospital, you will be given antibiotics through an IV tube. You may also be given oxygen or breathing treatments. Follow these instructions at home: Medicine  Take your antibiotic medicine as told by your health care provider. Do not stop taking the antibiotic even if you start to feel better.  Take over-the-counter and other prescription medicines only as told by your health care provider. Activity  Rest at home until you feel better.  Return to your normal activities as told by your health care provider. Ask your health care provider what activities are safe for you. General instructions  Drink enough fluid to keep your urine clear or pale yellow.  Do not use any products that contain nicotine or tobacco, such as cigarettes and e-cigarettes. If you need help quitting, ask your health care provider.  Limit alcohol intake to no more than 1 drink per day for nonpregnant women and 2 drinks per day for men. One drink equals 12 oz of beer, 5 oz of wine, or 1 oz of hard liquor.  Keep all follow-up visits as told by your health care provider. This is important. How is this prevented? Actions that I can take To lower your risk of getting this condition again:  Do not smoke. This includes e-cigarettes.  Do not drink too much alcohol.  Keep your immune system healthy by eating well and getting enough  sleep.  Get a flu shot every year (annually).  Get a pneumonia vaccination if: ? You are older than age 64. ? You smoke. ? You have a long-lasting condition like lung disease.  Exercise your lungs by taking deep breaths, walking, and using an incentive spirometer as directed.  Wash your hands often with soap and water. If you cannot get to a sink to wash your hands, use an alcohol-based hand cleaner.  Make sure your health care providers are washing their hands. If you do not see them wash their hands, ask them to do so.  When you are in a health care facility, avoid touching your eyes, nose, and mouth.  Avoid touching any surface near where people have coughed or sneezed.  Stand away from sick people when they are coughing or sneezing.  Wear a mask if you cannot avoid exposure to people who are sick.  Clean all surfaces often with a disinfectant cleaner, especially if someone is sick at home or work.  Precautions of my health care team Hospitals, nursing homes, and other health care facilities take special care to try to prevent healthcare-associated pneumonia. To do this, your health  care team may:  Clean their hands with soap and water or with alcohol-based hand sanitizer before and after seeing patients.  Wear gloves or masks during treatment.  Sanitize medical instruments, tubes, other equipment, and surfaces in patient rooms.  Raise (elevate) the head of your hospital bed so you are not lying flat. The head of the bed may be elevated 30 degrees or more.  Have you sit up and move around as soon as possible after surgery.  Only insert a breathing tube if needed.  Do these things for you if you have a breathing tube: ? Clean the inside of your mouth regularly. ? Remove the breathing tube as soon as it is no longer needed.  Contact a health care provider if:  Your symptoms do not get better or they get worse.  Your symptoms come back after you have finished taking  your antibiotics. Get help right away if:  You have trouble breathing.  You have confusion or difficulty thinking. This information is not intended to replace advice given to you by your health care provider. Make sure you discuss any questions you have with your health care provider. Document Released: 07/22/2015 Document Revised: 12/16/2015 Document Reviewed: 11/28/2015 Elsevier Interactive Patient Education  2018 [employer].  Delirium Delirium is a state of mental confusion. It comes on quickly and causes significant changes in a person's thinking and behavior. People with delirium usually have trouble paying attention to what is going on or knowing where they are. They may become very withdrawn or very emotional and unable to sit still. They may even see or feel things that are not there (hallucinations). Delirium is a sign of a serious underlying medical condition. What are the causes? Delirium occurs when something suddenly affects the signals that the brain sends out. Brain signals can be affected by anything that puts severe stress on the body and brain and causes brain chemicals to be out of balance. The most common causes of delirium include:  Infections. These may be bacterial, viral, fungal, or protozoal.  Medicines. These include many over-the-counter and prescription medicines.  Recreational drugs.  Substance withdrawal. This occurs with sudden discontinuation of alcohol, certain medicines, or recreational drugs.  Surgery.  Sudden vascular events, such as stroke, brain hemorrhage, and severe migraine.  Other brain disorders, such as tumors, seizures, and physical head trauma.  Metabolic disorders, such as kidney or liver failure.  Low blood oxygen (anoxia). This may occur with lung disease, cardiac arrest, or carbon monoxide poisoning.  Hormone imbalances (endocrinopathies), such as an overactive thyroid (hyperthyroidism) or underactive thyroid  (hypothyroidism).  Vitamin deficiencies.  What increases the risk? This condition is more likely to develop in:  Children.  Older people.  People who live alone.  People who have vision loss or hearing loss.  People who have existing brain disease, such as dementia.  People who have long-lasting (chronic) medical conditions, such as heart disease.  People who are hospitalized for long periods of time.  What are the signs or symptoms? Delirium starts with a sudden change in a person's thinking or behavior. Symptoms come and go (fluctuate) over time, and they are often worse at the end of the day. Symptoms include:  Not being able to stay awake (drowsiness) or pay attention.  Being confused about places, time, and people.  Forgetfulness.  Having extreme energy levels. These may be low or high.  Changes in sleep patterns.  Extreme mood swings, such as anger or anxiety.  Focusing on  things or ideas that are not important.  Rambling and senseless talking.  Difficulty speaking, understanding speech, or both.  Hallucinations.  Tremor or unsteady gait.  How is this diagnosed? People with delirium may not realize that they have the condition. Often, a family member or health care provider is the first person to notice the changes. The health care provider will obtain a detailed history of current symptoms, medical issues, medicines, and recreational drug use. The health care provider will perform a mental status examination by:  Asking questions to check for confusion.  Watching for abnormal behavior.  The health care provider may perform a physical exam and order lab tests or additional studies to determine the cause of the delirium. How is this treated? Treatment of delirium depends on the cause and severity. Delirium usually goes away within days or weeks of treating the underlying cause. In the meantime, the person should not be left alone because he or she may  accidentally cause self-harm. Treatment includes supportive care, such as:  Increased light during the day and decreased light at night.  Low noise level.  Uninterrupted sleep.  A regular daily schedule.  Clocks and calendars to help with orientation.  Familiar objects, including the person's pictures and clothing.  Frequent visits from familiar family and friends.  Healthy diet.  Exercise.  In more severe cases of delirium, medicine may be prescribed to help the person to keep calm and think more clearly. Follow these instructions at home:  Any supportive care should be continued as told by the health care provider.  All medicines should be used as told by the health care provider. This is important.  The health care provider should be consulted before over-the-counter medicines, herbs, or supplements are used.  All follow-up visits should be kept as told by the health care provider. This is important.  Alcohol and recreational drugs should be avoided as told by the health care provider. Contact a health care provider if:  Symptoms do not get better or they become worse.  New symptoms of delirium develop.  Caring for the person at home does not seem safe.  Eating, drinking, or communicating stops.  There are side effects of medicines, such as changes in sleep patterns, dizziness, weight gain, restlessness, movement changes, or tremors. Get help right away if:  Serious thoughts occur about self-harm or about hurting others.  There are serious side effects of medicine, such as: ? Swelling of the face, lips, tongue, or throat. ? Fever, confusion, muscle spasms, or seizures. This information is not intended to replace advice given to you by your health care provider. Make sure you discuss any questions you have with your health care provider. Document Released: 11/24/2011 Document Revised: 08/07/2015 Document Reviewed: 04/24/2014 Elsevier Interactive Patient Education   2018 [employer].   Confusion Confusion is the inability to think with your usual speed or clarity. Confusion may come on quickly or slowly over time. How quickly the confusion comes on depends on the cause. Confusion can be due to any number of causes. What are the causes?  Concussion, head injury, or head trauma.  Seizures.  Stroke.  Fever.  Brain tumor.  Age related decreased brain function (dementia).  Heightened emotional states like rage or terror.  Mental illness in which the person loses the ability to determine what is real and what is not (hallucinations).  Infections such as a urinary tract infection (UTI).  Toxic effects from alcohol, drugs, or prescription medicines.  Dehydration and  an imbalance of salts in the body (electrolytes).  Lack of sleep.  Low blood sugar (diabetes).  Low levels of oxygen from conditions such as chronic lung disorders.  Drug interactions or other medicine side effects.  Nutritional deficiencies, especially niacin, thiamine, vitamin C, or vitamin B.  Sudden drop in body temperature (hypothermia).  Change in routine, such as when traveling or hospitalized. What are the signs or symptoms? People often describe their thinking as cloudy or unclear when they are confused. Confusion can also include feeling disoriented. That means you are unaware of where or who you are. You may also not know what the date or time is. If confused, you may also have difficulty paying attention, remembering, and making decisions. Some people also act aggressively when they are confused. How is this diagnosed? The medical evaluation of confusion may include:  Blood and urine tests.  X-rays.  Brain and nervous system tests.  Analyzing your brain waves (electroencephalogram or EEG).  Magnetic resonance imaging (MRI) of your head.  Computed tomography (CT) scan of your head.  Mental status tests in which your health care provider may ask many  questions. Some of these questions may seem silly or strange, but they are a very important test to help diagnose and treat confusion.  How is this treated? An admission to the hospital may not be needed, but a person with confusion should not be left alone. Stay with a family member or friend until the confusion clears. Avoid alcohol, pain relievers, or sedative drugs until you have fully recovered. Do not drive until directed by your health care provider. Follow these instructions at home: What family and friends can do:  To find out if someone is confused, ask the person to state his or her name, age, and the date. If the person is unsure or answers incorrectly, he or she is confused.  Always introduce yourself, no matter how well the person knows you.  Often remind the person of his or her location.  Place a calendar and clock near the confused person.  Help the person with his or her medicines. You may want to use a pill box, an alarm as a reminder, or give the person each dose as prescribed.  Talk about current events and plans for the day.  Try to keep the environment calm, quiet, and peaceful.  Make sure the person keeps follow-up visits with his or her health care provider.  How is this prevented? Ways to prevent confusion:  Avoid alcohol.  Eat a balanced diet.  Get enough sleep.  Take medicine only as directed by your health care provider.  Do not become isolated. Spend time with other people and make plans for your days.  Keep careful watch on your blood sugar levels if you are diabetic.  Get help right away if:  You develop severe headaches, repeated vomiting, seizures, blackouts, or slurred speech.  There is increasing confusion, weakness, numbness, restlessness, or personality changes.  You develop a loss of balance, have marked dizziness, feel uncoordinated, or fall.  You have delusions, hallucinations, or develop severe anxiety.  Your family members  think you need to be rechecked. This information is not intended to replace advice given to you by your health care provider. Make sure you discuss any questions you have with your health care provider. Document Released: 04/08/2004 Document Revised: 09/19/2015 Document Reviewed: 04/06/2013 Elsevier Interactive Patient Education  2018 [employer].   Acute Respiratory Failure, Adult Acute respiratory failure occurs when there  is not enough oxygen passing from your lungs to your body. When this happens, your lungs have trouble removing carbon dioxide from the blood. This causes your blood oxygen level to drop too low as carbon dioxide builds up. Acute respiratory failure is a medical emergency. It can develop quickly, but it is temporary if treated promptly. Your lung capacity, or how much air your lungs can hold, may improve with time, exercise, and treatment. What are the causes? There are many possible causes of acute respiratory failure, including:  Lung injury.  Chest injury or damage to the ribs or tissues near the lungs.  Lung conditions that affect the flow of air and blood into and out of the lungs, such as pneumonia, acute respiratory distress syndrome, and cystic fibrosis.  Medical conditions, such as strokes or spinal cord injuries, that affect the muscles and nerves that control breathing.  Blood infection (sepsis).  Inflammation of the pancreas (pancreatitis).  A blood clot in the lungs (pulmonary embolism).  A large-volume blood transfusion.  Burns.  Near-drowning.  Seizure.  Smoke inhalation.  Reaction to medicines.  Alcohol or drug overdose.  What increases the risk? This condition is more likely to develop in people who have:  A blocked airway.  Asthma.  A condition or disease that damages or weakens the muscles, nerves, bones, or tissues that are involved in breathing.  A serious infection.  A health problem that blocks the unconscious reflex that  is involved in breathing, such as hypothyroidism or sleep apnea.  A lung injury or trauma.  What are the signs or symptoms? Trouble breathing is the main symptom of acute respiratory failure. Symptoms may also include:  Rapid breathing.  Restlessness or anxiety.  Skin, lips, or fingernails that appear blue (cyanosis).  Rapid heart rate.  Abnormal heart rhythms (arrhythmias).  Confusion or changes in behavior.  Tiredness or loss of energy.  Feeling sleepy or having a loss of consciousness.  How is this diagnosed? Your health care provider can diagnose acute respiratory failure with a medical history and physical exam. During the exam, your health care provider will listen to your heart and check for crackling or wheezing sounds in your lungs. Your may also have tests to confirm the diagnosis and determine what is causing respiratory failure. These tests may include:  Measuring the amount of oxygen in your blood (pulse oximetry). The measurement comes from a small device that is placed on your finger, earlobe, or toe.  Other blood tests to measure blood gases and to look for signs of infection.  Sampling your cerebral spinal fluid or tracheal fluid to check for infections.  Chest X-ray to look for fluid in spaces that should be filled with air.  Electrocardiogram (ECG) to look at the heart's electrical activity.  How is this treated? Treatment for this condition usually takes places in a hospital intensive care unit (ICU). Treatment depends on what is causing the condition. It may include one or more treatments until your symptoms improve. Treatment may include:  Supplemental oxygen. Extra oxygen is given through a tube in the nose, a face mask, or a hood.  A device such as a continuous positive airway pressure (CPAP) or bi-level positive airway pressure (BiPAP or BPAP) machine. This treatment uses mild air pressure to keep the airways open. A mask or other device will be  placed over your nose or mouth. A tube that is connected to a motor will deliver oxygen through the mask.  Ventilator. This treatment helps  move air into and out of the lungs. This may be done with a bag and mask or a machine. For this treatment, a tube is placed in your windpipe (trachea) so air and oxygen can flow to the lungs.  Extracorporeal membrane oxygenation (ECMO). This treatment temporarily takes over the function of the heart and lungs, supplying oxygen and removing carbon dioxide. ECMO gives the lungs a chance to recover. It may be used if a ventilator is not effective.  Tracheostomy. This is a procedure that creates a hole in the neck to insert a breathing tube.  Receiving fluids and medicines.  Rocking the bed to help breathing.  Follow these instructions at home:  Take over-the-counter and prescription medicines only as told by your health care provider.  Return to normal activities as told by your health care provider. Ask your health care provider what activities are safe for you.  Keep all follow-up visits as told by your health care provider. This is important. How is this prevented? Treating infections and medical conditions that may lead to acute respiratory failure can help prevent the condition from developing. Contact a health care provider if:  You have a fever.  Your symptoms do not improve or they get worse. Get help right away if:  You are having trouble breathing.  You lose consciousness.  Your have cyanosis or turn blue.  You develop a rapid heart rate.  You are confused. These symptoms may represent a serious problem that is an emergency. Do not wait to see if the symptoms will go away. Get medical help right away. Call your local emergency services (911 in the [place] Do not drive yourself to the hospital. This information is not intended to replace advice given to you by your health care provider. Make sure you discuss any questions you have with  your health care provider. Document Released: 03/06/2013 Document Revised: 09/27/2015 Document Reviewed: 09/17/2015 Elsevier Interactive Patient Education  [employer].

## 2017-08-20 NOTE — Progress Notes (Signed)
Patient discharged home with his brother. IV removed and site intact. Sent home with his personal belongings.

## 2017-08-20 NOTE — Discharge Summary (Signed)
Physician Discharge Summary  Melvin Hart SHF:[id number] DOB: 1946/01/28 DOA: 08/16/2017  PCP: Henreitta Cea, MD  Admit date: 08/16/2017 Discharge date: 08/20/2017  Admitted From: Home  Disposition: Home , FAMILY DECLINES SNF PLACEMENT AS RECOMMENDED  Recommendations for Outpatient Follow-up:  1. Follow up with PCP in 1 weeks 2. Please obtain BMP/CBC in one week  Home Health: Resuming services at discharge   Discharge Condition: STABLE   CODE STATUS: FULL    Brief Hospitalization Summary: Please see all hospital notes, images, labs for full details of the hospitalization. HPI:   Melvin Hart  is a 72 y.o. male, with history of COPD, hypertension, subacute left MCA CVA, right loculated pleural effusion, normocytic anemia was brought to hospital with complaints of coughing up blood.  Patient is confused at this time and is unable to provide any significant history.  As per nursing staff family told that patient has been having intermittent confusion since he was discharged from the hospital.  He was seen by palliative care during previous admission in May 2019 and plan was to follow-up palliative care as outpatient. In the ED patient was found to be febrile with temperature 101.3. WBC 11.9.   Patient started on vancomycin, Zosyn for healthcare associated pneumonia.  MDM/Assessment & Plan:   1. HCAP - The patient is improving clinically with no more fevers and better oxygenation.  WBC IS TRENDING DOWN.  He was weaned to room air.  His mentation has not improved and likely this is his baseline.  He is only oriented to self.  Continue doxycycline.   / 2. Altered mental status - He is at his baseline after discussion with family.  He is only oriented to self.  Continue to monitor.   3. Hypertension - stable, following.  His blood pressures have been controlled.   4. Pleural effusion -  Discussions with family reveal that they agree that surgery is too risky for patient given his frailty  and the patient had expressed in the past that he did not want surgery for this. If they change their minds, they were advised to make an appt with CT surgery. They verbalized understanding.     5. Anemia of chronic disease - continue to monitor, has been stable.   6. Dementia - He has been started on nightly low dose seroquel and that has kept him stable in the hospital and he has not had a lot of sundowning.  He should follow up with PCP and be re-evaluated.  I recommended that he be seen by his PCP in 1 week.  I strongly advised SNF placement but family wants to take him home and have declined SNF placement therefore the social workers have signed off his case.  HE IS HIGH RISK FOR READMISSION.   DVT prophylaxis: SCDs Code Status: Full  Disposition Plan: Family prefers to take patient home and have declined SNF placement   Consultants:  Palliative medicine  Discharge Diagnoses:  Active Problems:   Lung mass   Pleural effusion   Cerebrovascular accident (CVA) ([place])   HCAP (healthcare-associated pneumonia)   Acute metabolic encephalopathy   Confusion   Hypoxia   Palliative care by specialist   Goals of care, counseling/discussion   DNR (do not resuscitate) discussion  Discharge Instructions: Discharge Instructions    Call MD for:  difficulty breathing, headache or visual disturbances   Complete by:  As directed    Call MD for:  extreme fatigue   Complete by:  As directed  Call MD for:  persistant dizziness or light-headedness   Complete by:  As directed    Call MD for:  persistant nausea and vomiting   Complete by:  As directed    Call MD for:  severe uncontrolled pain   Complete by:  As directed    Increase activity slowly   Complete by:  As directed      Allergies as of 08/20/2017      Reactions   Cephalexin Itching   Codeine Anxiety      Medication List    STOP taking these medications   ADVAIR DISKUS 250-50 MCG/DOSE Aepb Generic drug:   Fluticasone-Salmeterol     TAKE these medications   aspirin 325 MG tablet Take 1 tablet (325 mg total) by mouth daily.   atorvastatin 40 MG tablet Commonly known as:  LIPITOR Take 1 tablet (40 mg total) by mouth daily at 6 PM.   doxycycline 100 MG tablet Commonly known as:  VIBRA-TABS Take 1 tablet (100 mg total) by mouth every 12 (twelve) hours for 6 days.   mometasone-formoterol 200-5 MCG/ACT Aero Commonly known as:  DULERA Inhale 2 puffs into the lungs 2 (two) times daily.   QUEtiapine 25 MG tablet Commonly known as:  SEROQUEL Take 1 tablet (25 mg total) by mouth at bedtime.   thiamine 100 MG tablet Commonly known as:  VITAMIN B-1 Take 1 tablet (100 mg total) by mouth daily.   triamcinolone cream 0.5 % Commonly known as:  KENALOG Apply to rash on back three times daily as needed for itching      Follow-up Information    Henreitta Cea, MD. Schedule an appointment as soon as possible for a visit in 1 week(s).   Specialty:  Family Medicine         Allergies  Allergen Reactions  . Cephalexin Itching  . Codeine Anxiety   Allergies as of 08/20/2017      Reactions   Cephalexin Itching   Codeine Anxiety      Medication List    STOP taking these medications   ADVAIR DISKUS 250-50 MCG/DOSE Aepb Generic drug:  Fluticasone-Salmeterol     TAKE these medications   aspirin 325 MG tablet Take 1 tablet (325 mg total) by mouth daily.   atorvastatin 40 MG tablet Commonly known as:  LIPITOR Take 1 tablet (40 mg total) by mouth daily at 6 PM.   doxycycline 100 MG tablet Commonly known as:  VIBRA-TABS Take 1 tablet (100 mg total) by mouth every 12 (twelve) hours for 6 days.   mometasone-formoterol 200-5 MCG/ACT Aero Commonly known as:  DULERA Inhale 2 puffs into the lungs 2 (two) times daily.   QUEtiapine 25 MG tablet Commonly known as:  SEROQUEL Take 1 tablet (25 mg total) by mouth at bedtime.   thiamine 100 MG tablet Commonly known as:  VITAMIN B-1 Take  1 tablet (100 mg total) by mouth daily.   triamcinolone cream 0.5 % Commonly known as:  KENALOG Apply to rash on back three times daily as needed for itching       Procedures/Studies: Dg Chest 1 View  Result Date: 07/26/2017 CLINICAL DATA:  RIGHT pleural effusion post thoracentesis EXAM: CHEST  1 VIEW COMPARISON:  07/25/2017 FINDINGS: Normal heart size, mediastinal contours, and pulmonary vascularity. Persistent RIGHT pleural effusion with atelectasis throughout RIGHT lung. A more rounded area of opacity is seen at the mid to lower RIGHT hemithorax, question confluent atelectasis or infiltrate, without definite discrete mass by recent CT.  No pneumothorax. Mild atelectasis at LEFT base. Bones demineralized. IMPRESSION: No pneumothorax following RIGHT thoracentesis. Electronically Signed   By: Lavonia Dana M.D.   On: 07/26/2017 15:56   Dg Chest 2 View  Result Date: 08/16/2017 CLINICAL DATA:  Shortness of breath. EXAM: CHEST - 2 VIEW COMPARISON:  Chest x-ray dated Jul 26, 2017. FINDINGS: Unchanged loculated right pleural effusion with volume loss in the right hemithorax and atelectasis in the right lower lobe. Persistent rightward shift of the mediastinum. The left lung is clear. No pneumothorax. Stable cardiomediastinal silhouette. No acute osseous abnormality. IMPRESSION: 1. Unchanged loculated right pleural effusion and volume loss in the right hemithorax. Electronically Signed   By: Titus Dubin M.D.   On: 08/16/2017 21:34   Ct Head Wo Contrast  Result Date: 07/25/2017 CLINICAL DATA:  Mental status change today.  Combative. EXAM: CT HEAD WITHOUT CONTRAST TECHNIQUE: Contiguous axial images were obtained from the base of the skull through the vertex without intravenous contrast. COMPARISON:  None. FINDINGS: Brain: There is a moderate-sized posterior left MCA territory infarct predominantly involving the parietal lobe which is favored to be subacute. There could be minimal petechial hemorrhage,  however no malignant hemorrhagic transformation is present. Minimal rightward midline shift measures 4 mm. Small infarcts involving the left caudate head and right parietal cortex are of indeterminate acuity. No extra-axial fluid collection is seen. Periventricular white matter hypodensities are nonspecific but compatible with mild chronic small vessel ischemic disease. There is moderate cerebral atrophy. Vascular: Calcified atherosclerosis at the skull base. No hyperdense vessel. Skull: No fracture or destructive osseous lesion. Sinuses/Orbits: Minimal bilateral ethmoid air cell mucosal thickening. Trace right mastoid fluid. Unremarkable orbits. Other: None. IMPRESSION: 1. Subacute, moderate-sized posterior left MCA infarct. 2. Small age indeterminate right parietal and left caudate infarcts. 3. Mild chronic small vessel ischemic disease and moderate cerebral atrophy. Electronically Signed   By: Logan Bores M.D.   On: 07/25/2017 15:49   Ct Chest W Contrast  Result Date: 07/25/2017 CLINICAL DATA:  Altered mental status. EXAM: CT CHEST WITH CONTRAST TECHNIQUE: Multidetector CT imaging of the chest was performed during intravenous contrast administration. CONTRAST:  63mL ISOVUE-300 IOPAMIDOL (ISOVUE-300) INJECTION 61% COMPARISON:  CT scan of May 23, 2013. FINDINGS: Cardiovascular: Atherosclerosis of thoracic aorta is noted without aneurysm or dissection. Coronary calcifications are noted. No pericardial effusion is noted. Mediastinum/Nodes: Thyroid gland and esophagus are unremarkable. 1.9 cm subcarinal adenopathy is noted 1.3 cm right paratracheal lymph node is noted. These are new since prior exam. Lungs/Pleura: No pneumothorax is noted. Left lung is clear. Emphysematous disease is noted in the right upper lobe. There is interval development of probable loculated right pleural effusion. Atelectasis or scarring is noted in the right lung base. Atelectasis of right upper lobe is noted; central endobronchial  obstruction cannot be excluded. Upper Abdomen: Bilateral nephrolithiasis is noted. Musculoskeletal: No chest wall abnormality. No acute or significant osseous findings. IMPRESSION: Interval development of loculated right pleural effusion, with atelectasis or scarring noted in right lung base. Atelectasis of right upper lobe is noted. The possibility of central obstructive endobronchial lesion cannot be excluded, and further evaluation with bronchoscopy is recommended. Interval development of right paratracheal and subcarinal adenopathy is noted; etiology is unknown, but metastatic disease cannot be excluded. Coronary artery calcifications are noted suggesting coronary artery disease. Bilateral nephrolithiasis. Aortic Atherosclerosis (ICD10-I70.0) and Emphysema (ICD10-J43.9). Electronically Signed   By: Marijo Conception, M.D.   On: 07/25/2017 15:52   Mr Jodene Nam Head Wo Contrast  Result Date:  07/26/2017 CLINICAL DATA:  72 year old male with altered mental status for 2 days. Subacute appearing posterior left MCA and other scattered age indeterminate infarcts on head CT yesterday. EXAM: MRI HEAD WITHOUT CONTRAST MRA HEAD WITHOUT CONTRAST TECHNIQUE: Multiplanar, multiecho pulse sequences of the brain and surrounding structures were obtained without intravenous contrast. Angiographic images of the head were obtained using MRA technique without contrast. COMPARISON:  Head CT 07/25/2017.  Cervical spine MRI 08/17/2011. FINDINGS: MRI HEAD FINDINGS Study is intermittently degraded by motion artifact despite repeated imaging attempts. Brain: Confluent restricted diffusion in the left parietal lobe affecting cortex and white matter in an area of 5 centimeters (series 6, image 41), corresponding to the most confluent area of hypodensity in the posterior left hemisphere on CT yesterday. Ischemia tracks into the left occipital lobe corresponding to the left MCA/PCA watershed territory. Multifocal scattered additional cortical and  subcortical white matter restricted diffusion elsewhere in the left MCA territory, including some of the anterior division (series 4, image 98). T2 and FLAIR hyperintensity in keeping with cytotoxic edema in the affected areas. No associated hemorrhage is evident. There is mild regional mass effect, including on the atrium of the left lateral ventricle. There is a solitary linear area of restricted diffusion in the contralateral right hemisphere affecting white matter in the inferior right parietal lobe seen on series 4, image 90. No associated hemorrhage or mass effect. No posterior fossa or pure posterior circulation restricted diffusion. Chronic lacunar infarcts in the left basal ganglia and left thalamus. The brainstem and cerebellum appear relatively spared. There is a small area of chronic cortical encephalomalacia in the right parietal lobe (series 9, image 33, which corresponds to the age indeterminate area seen by CT (#2 yesterday). No midline shift, evidence of mass lesion, ventriculomegaly, extra-axial collection or acute intracranial hemorrhage. Cervicomedullary junction and pituitary are within normal limits. Vascular: Major intracranial vascular flow voids are preserved. Skull and upper cervical spine: Negative visible cervical spine. Sinuses/Orbits: Grossly normal orbits soft tissues. Mild paranasal sinus mucosal thickening. Other: Trace right mastoid fluid. Grossly negative visible internal auditory structures. Negative scalp and face soft tissues. MRA HEAD FINDINGS Intermittently degraded by motion. Antegrade flow in the posterior circulation including codominant distal vertebral arteries. No distal vertebral stenosis. Patent PICA origins. Patent vertebrobasilar junction. No basilar stenosis. Tortuous basilar artery. Patent SCA and PCA origins. Symmetric bilateral PCA flow signal. Posterior communicating arteries are diminutive or absent. Antegrade flow in both ICA siphons. Bilateral siphon  irregularity, no high-grade siphon stenosis suspected. Ophthalmic artery origins appear normal. Patent carotid termini. The right ACA A1 segment appears dominant and the left diminutive. The anterior communicating artery and visible ACA branches appear within normal limits. Both MCA M1 segments are patent. Motion artifact obscures detail of the bilateral MCA bifurcations. There does appear to be asymmetrically decreased flow signal in the posterior left MCA territory. IMPRESSION: 1. Acute infarcts in the Left MCA territory, most pronounced in the posterior division which corresponds to the CT abnormality yesterday. Cytotoxic edema with minimal mass effect. No associated hemorrhage. 2. Superimposed small acute white matter infarct in the posterior right MCA territory. Given the absence of other vascular territory infarcts this may constellation reflect synchronous small vessel disease rather than a recent embolic event from the heart or proximal aorta. 3. Motion degraded MRA is negative for large vessel occlusion. However, stenosis or occlusion is suspected in at least some posterior left MCA branches. 4. Chronic small and medium-sized vessel ischemia in the left deep gray matter nuclei  and right parietal lobe. Electronically Signed   By: Genevie Ann M.D.   On: 07/26/2017 08:25   Mr Brain Wo Contrast  Result Date: 07/26/2017 CLINICAL DATA:  72 year old male with altered mental status for 2 days. Subacute appearing posterior left MCA and other scattered age indeterminate infarcts on head CT yesterday. EXAM: MRI HEAD WITHOUT CONTRAST MRA HEAD WITHOUT CONTRAST TECHNIQUE: Multiplanar, multiecho pulse sequences of the brain and surrounding structures were obtained without intravenous contrast. Angiographic images of the head were obtained using MRA technique without contrast. COMPARISON:  Head CT 07/25/2017.  Cervical spine MRI 08/17/2011. FINDINGS: MRI HEAD FINDINGS Study is intermittently degraded by motion artifact  despite repeated imaging attempts. Brain: Confluent restricted diffusion in the left parietal lobe affecting cortex and white matter in an area of 5 centimeters (series 6, image 41), corresponding to the most confluent area of hypodensity in the posterior left hemisphere on CT yesterday. Ischemia tracks into the left occipital lobe corresponding to the left MCA/PCA watershed territory. Multifocal scattered additional cortical and subcortical white matter restricted diffusion elsewhere in the left MCA territory, including some of the anterior division (series 4, image 98). T2 and FLAIR hyperintensity in keeping with cytotoxic edema in the affected areas. No associated hemorrhage is evident. There is mild regional mass effect, including on the atrium of the left lateral ventricle. There is a solitary linear area of restricted diffusion in the contralateral right hemisphere affecting white matter in the inferior right parietal lobe seen on series 4, image 90. No associated hemorrhage or mass effect. No posterior fossa or pure posterior circulation restricted diffusion. Chronic lacunar infarcts in the left basal ganglia and left thalamus. The brainstem and cerebellum appear relatively spared. There is a small area of chronic cortical encephalomalacia in the right parietal lobe (series 9, image 33, which corresponds to the age indeterminate area seen by CT (#2 yesterday). No midline shift, evidence of mass lesion, ventriculomegaly, extra-axial collection or acute intracranial hemorrhage. Cervicomedullary junction and pituitary are within normal limits. Vascular: Major intracranial vascular flow voids are preserved. Skull and upper cervical spine: Negative visible cervical spine. Sinuses/Orbits: Grossly normal orbits soft tissues. Mild paranasal sinus mucosal thickening. Other: Trace right mastoid fluid. Grossly negative visible internal auditory structures. Negative scalp and face soft tissues. MRA HEAD FINDINGS  Intermittently degraded by motion. Antegrade flow in the posterior circulation including codominant distal vertebral arteries. No distal vertebral stenosis. Patent PICA origins. Patent vertebrobasilar junction. No basilar stenosis. Tortuous basilar artery. Patent SCA and PCA origins. Symmetric bilateral PCA flow signal. Posterior communicating arteries are diminutive or absent. Antegrade flow in both ICA siphons. Bilateral siphon irregularity, no high-grade siphon stenosis suspected. Ophthalmic artery origins appear normal. Patent carotid termini. The right ACA A1 segment appears dominant and the left diminutive. The anterior communicating artery and visible ACA branches appear within normal limits. Both MCA M1 segments are patent. Motion artifact obscures detail of the bilateral MCA bifurcations. There does appear to be asymmetrically decreased flow signal in the posterior left MCA territory. IMPRESSION: 1. Acute infarcts in the Left MCA territory, most pronounced in the posterior division which corresponds to the CT abnormality yesterday. Cytotoxic edema with minimal mass effect. No associated hemorrhage. 2. Superimposed small acute white matter infarct in the posterior right MCA territory. Given the absence of other vascular territory infarcts this may constellation reflect synchronous small vessel disease rather than a recent embolic event from the heart or proximal aorta. 3. Motion degraded MRA is negative for large vessel occlusion. However,  stenosis or occlusion is suspected in at least some posterior left MCA branches. 4. Chronic small and medium-sized vessel ischemia in the left deep gray matter nuclei and right parietal lobe. Electronically Signed   By: Genevie Ann M.D.   On: 07/26/2017 08:25   US Carotid Bilateral  Result Date: 07/26/2017 CLINICAL DATA:  Left MCA acute strokes EXAM: BILATERAL CAROTID DUPLEX ULTRASOUND TECHNIQUE: Pearline Cables scale imaging, color Doppler and duplex ultrasound were performed of  bilateral carotid and vertebral arteries in the neck. COMPARISON:  07/26/2017 MRI FINDINGS: Criteria: Quantification of carotid stenosis is based on velocity parameters that correlate the residual internal carotid diameter with NASCET-based stenosis levels, using the diameter of the distal internal carotid lumen as the denominator for stenosis measurement. The following velocity measurements were obtained: RIGHT ICA: 108/32 cm/sec CCA: 63/14 cm/sec SYSTOLIC ICA/CCA RATIO:  1.1 ECA:  104 cm/sec LEFT ICA: 193/20 cm/sec CCA: 97/02 cm/sec SYSTOLIC ICA/CCA RATIO:  4.0 ECA:  69 cm/sec RIGHT CAROTID ARTERY: Minor echogenic shadowing plaque formation. No hemodynamically significant right ICA stenosis, velocity elevation, or turbulent flow. Degree of narrowing less than 50%. RIGHT VERTEBRAL ARTERY:  Antegrade LEFT CAROTID ARTERY: Moderate to severe heterogeneous calcified bifurcation atherosclerosis. This results in luminal narrowing by grayscale imaging. Proximal left ICA velocity elevation and turbulent flow noted. Left proximal ICA velocity measures 193/25 centimeters/second. By ultrasound criteria left ICA stenosis estimated at 50-69%. LEFT VERTEBRAL ARTERY:  Antegrade IMPRESSION: Left greater than right carotid atherosclerosis. Left ICA stenosis estimated at 50-69% by ultrasound criteria Right ICA narrowing less than 50% Patent antegrade vertebral flow bilaterally Electronically Signed   By: Jerilynn Mages.  Shick M.D.   On: 07/26/2017 09:15   Dg Chest Port 1 View  Result Date: 07/25/2017 CLINICAL DATA:  Confusion and weakness EXAM: PORTABLE CHEST 1 VIEW COMPARISON:  05/23/2013 FINDINGS: Cardiac shadow is within normal limits. Increasing effusion and likely underlying infiltrate is noted on the right. The left lung remains clear. No bony abnormality is noted. IMPRESSION: Increasing opacity within the right hemithorax consistent with infiltrate and underlying effusion. Electronically Signed   By: Inez Catalina M.D.   On:  07/25/2017 14:09   US Thoracentesis Asp Pleural Space W/img Guide  Result Date: 07/26/2017 INDICATION: RIGHT pleural effusion, for diagnostic and therapeutic thoracentesis EXAM: ULTRASOUND GUIDED DIAGNOSTIC RIGHT THORACENTESIS MEDICATIONS: None. COMPLICATIONS: None immediate. PROCEDURE: Procedure, benefits, and risks of procedure were discussed with patient. Written informed consent for procedure was obtained. Time out protocol followed. Pleural effusion localized by ultrasound at the posterior RIGHT hemithorax. RIGHT pleural effusion is markedly loculated and highly complex in appearance. Skin prepped and draped in usual sterile fashion. Skin and soft tissues anesthetized with 10 mL of 1% lidocaine. 8 French thoracentesis catheter placed into the RIGHT pleural space. Only 23 mL of slightly cloudy RIGHT pleural fluid was able to be aspirated by syringe. Procedure tolerated well by patient without immediate complication. FINDINGS: Only 23 mL of RIGHT pleural fluid could be removed. Entire sample was sent to the laboratory as requested by the clinical team. IMPRESSION: Successful ultrasound guided RIGHT thoracentesis yielding 23 mL of pleural fluid. Electronically Signed   By: Lavonia Dana M.D.   On: 07/26/2017 16:02     Subjective: Pt has no complaints. He is eating and drinking well.    Discharge Exam: Vitals:   08/19/17 2019 08/20/17 0526  BP: 106/72 118/71  Pulse: 91 90  Resp: 20 18  Temp: 98.7 F (37.1 C) 98.3 F (36.8 C)  SpO2: 97% 92%  Vitals:   08/19/17 0549 08/19/17 1442 08/19/17 2019 08/20/17 0526  BP: 97/62 (!) 91/57 106/72 118/71  Pulse: 79 85 91 90  Resp: 15 17 20 18   Temp: 98.6 F (37 C) 98.7 F (37.1 C) 98.7 F (37.1 C) 98.3 F (36.8 C)  TempSrc: Oral Oral Oral Oral  SpO2: 90% 98% 97% 92%  Weight:      Height:       General: Pt is alert, awake, not in acute distress Cardiovascular: RRR, S1/S2 +, no rubs, no gallops Respiratory: rare expiratory wheezing noted,  diminished BS RUL Abdominal: Soft, NT, ND, bowel sounds + Extremities: no edema, no cyanosis   The results of significant diagnostics from this hospitalization (including imaging, microbiology, ancillary and laboratory) are listed below for reference.     Microbiology: Recent Results (from the past 240 hour(s))  Culture, Urine     Status: None   Collection Time: 08/17/17  1:30 AM  Result Value Ref Range Status   Specimen Description   Final    URINE, CLEAN CATCH Performed at [facility], [address] [place], [place] [address]    Special Requests   Final    NONE Performed at [facility], [address] [place], [place] [address]    Culture   Final    NO GROWTH Performed at [facility], [place] [address] [place], [place] [address]    Report Status 08/18/2017 FINAL  Final  Culture, blood (Routine X 2) w Reflex to ID Panel     Status: None (Preliminary result)   Collection Time: 08/17/17  1:33 AM  Result Value Ref Range Status   Specimen Description BLOOD RIGHT ARM  Final   Special Requests   Final    BOTTLES DRAWN AEROBIC AND ANAEROBIC Blood Culture adequate volume   Culture   Final    NO GROWTH 2 DAYS Performed at [facility], [address] [place], [place] [address]    Report Status PENDING  Incomplete  Culture, blood (Routine X 2) w Reflex to ID Panel     Status: None (Preliminary result)   Collection Time: 08/17/17  1:36 AM  Result Value Ref Range Status   Specimen Description BLOOD RIGHT ARM  Final   Special Requests   Final    BOTTLES DRAWN AEROBIC AND ANAEROBIC Blood Culture adequate volume   Culture   Final    NO GROWTH 2 DAYS Performed at [facility], [address] [place], [place] [address]    Report Status PENDING  Incomplete    Labs: BNP (last 3 results) No results for input(s): BNP in the last 8760 hours. Basic Metabolic Panel: Recent Labs  Lab 08/16/17 2136 08/17/17 0448 08/18/17 0432 08/19/17 0305  NA 135 135 137 134*  K 4.3 4.1 3.8  3.7  CL 100* 100* 103 102  CO2 25 28 27 25   GLUCOSE 114* 111* 108* 104*  BUN 16 13 10 11   CREATININE 0.73 0.81 0.75 0.83  CALCIUM 8.0* 8.0* 8.2* 7.9*  MG  --   --   --  1.8   Liver Function Tests: Recent Labs  Lab 08/16/17 2136 08/17/17 0448  AST 47* 43*  ALT 60 55  ALKPHOS 114 100  BILITOT 0.6 0.5  PROT 6.8 6.1*  ALBUMIN 2.1* 1.9*   No results for input(s): LIPASE, AMYLASE in the last 168 hours. No results for input(s): AMMONIA in the last 168 hours. CBC: Recent Labs  Lab 08/16/17 2136 08/17/17 0448 08/19/17 0305  WBC 11.9* 12.1* 10.9*  NEUTROABS 8.6*  --  8.1*  HGB 8.0* 8.1* 7.9*  HCT 26.0* 25.2* 24.9*  MCV 80.2 80.3 79.3  PLT 294 322 344   Cardiac Enzymes: No results for input(s): CKTOTAL, CKMB, CKMBINDEX, TROPONINI in the last 168 hours. BNP: Invalid input(s): POCBNP CBG: No results for input(s): GLUCAP in the last 168 hours. D-Dimer No results for input(s): DDIMER in the last 72 hours. Hgb A1c No results for input(s): HGBA1C in the last 72 hours. Lipid Profile No results for input(s): CHOL, HDL, LDLCALC, TRIG, CHOLHDL, LDLDIRECT in the last 72 hours. Thyroid function studies No results for input(s): TSH, T4TOTAL, T3FREE, THYROIDAB in the last 72 hours.  Invalid input(s): FREET3 Anemia work up No results for input(s): VITAMINB12, FOLATE, FERRITIN, TIBC, IRON, RETICCTPCT in the last 72 hours. Urinalysis    Component Value Date/Time   COLORURINE YELLOW 08/17/2017 0130   APPEARANCEUR CLEAR 08/17/2017 0130   LABSPEC 1.012 08/17/2017 0130   PHURINE 6.0 08/17/2017 0130   GLUCOSEU NEGATIVE 08/17/2017 0130   HGBUR NEGATIVE 08/17/2017 0130   BILIRUBINUR NEGATIVE 08/17/2017 0130   KETONESUR NEGATIVE 08/17/2017 0130   PROTEINUR NEGATIVE 08/17/2017 0130   NITRITE NEGATIVE 08/17/2017 0130   LEUKOCYTESUR NEGATIVE 08/17/2017 0130   Sepsis Labs Invalid input(s): PROCALCITONIN,  WBC,  LACTICIDVEN Microbiology Recent Results (from the past 240 hour(s))   Culture, Urine     Status: None   Collection Time: 08/17/17  1:30 AM  Result Value Ref Range Status   Specimen Description   Final    URINE, CLEAN CATCH Performed at [facility], [address] [place], [place] [address]    Special Requests   Final    NONE Performed at [facility], [address], [place], [place] [address]    Culture   Final    NO GROWTH Performed at [facility], [place] [address], [place], [place] [address]    Report Status 08/18/2017 FINAL  Final  Culture, blood (Routine X 2) w Reflex to ID Panel     Status: None (Preliminary result)   Collection Time: 08/17/17  1:33 AM  Result Value Ref Range Status   Specimen Description BLOOD RIGHT ARM  Final   Special Requests   Final    BOTTLES DRAWN AEROBIC AND ANAEROBIC Blood Culture adequate volume   Culture   Final    NO GROWTH 2 DAYS Performed at [facility], [address] [place], [place] [address]    Report Status PENDING  Incomplete  Culture, blood (Routine X 2) w Reflex to ID Panel     Status: None (Preliminary result)   Collection Time: 08/17/17  1:36 AM  Result Value Ref Range Status   Specimen Description BLOOD RIGHT ARM  Final   Special Requests   Final    BOTTLES DRAWN AEROBIC AND ANAEROBIC Blood Culture adequate volume   Culture   Final    NO GROWTH 2 DAYS Performed at [facility], [address] [place],  [address]    Report Status PENDING  Incomplete   Time coordinating discharge: 46 MINUTES  SIGNED:  Irwin Brakeman, MD  [facility] 08/20/2017, 10:24 AM Pager [phone]  If 7PM-7AM, please contact night-coverage [email or web address] Password [email or web address]

## 2017-08-22 LAB — CULTURE, BLOOD (ROUTINE X 2)
Culture: NO GROWTH
Culture: NO GROWTH
Special Requests: ADEQUATE
Special Requests: ADEQUATE

## 2017-08-23 ENCOUNTER — Telehealth: Payer: Self-pay

## 2017-08-23 NOTE — Telephone Encounter (Signed)
Melvin Hart from [facility] states pt just received event monitor.After pt wears it and it is resulted he will have New patient apt

## 2017-08-23 NOTE — Telephone Encounter (Signed)
Caryl Pina w/ [facility] called stating this pt received a monitor in the mail and they'd like to know who he's supposed to follow up with to get the results please call Annetta North @ [phone]

## 2017-08-25 DIAGNOSIS — J449 Chronic obstructive pulmonary disease, unspecified: Secondary | ICD-10-CM | POA: Diagnosis not present

## 2017-08-25 DIAGNOSIS — R1310 Dysphagia, unspecified: Secondary | ICD-10-CM | POA: Diagnosis not present

## 2017-08-25 DIAGNOSIS — I69320 Aphasia following cerebral infarction: Secondary | ICD-10-CM | POA: Diagnosis not present

## 2017-08-25 DIAGNOSIS — I10 Essential (primary) hypertension: Secondary | ICD-10-CM | POA: Diagnosis not present

## 2017-08-25 DIAGNOSIS — R918 Other nonspecific abnormal finding of lung field: Secondary | ICD-10-CM | POA: Diagnosis not present

## 2017-08-25 DIAGNOSIS — I69391 Dysphagia following cerebral infarction: Secondary | ICD-10-CM | POA: Diagnosis not present

## 2017-08-25 DIAGNOSIS — J90 Pleural effusion, not elsewhere classified: Secondary | ICD-10-CM | POA: Diagnosis not present

## 2017-08-25 DIAGNOSIS — D649 Anemia, unspecified: Secondary | ICD-10-CM | POA: Diagnosis not present

## 2017-09-05 DIAGNOSIS — I69391 Dysphagia following cerebral infarction: Secondary | ICD-10-CM | POA: Diagnosis not present

## 2017-09-05 DIAGNOSIS — J449 Chronic obstructive pulmonary disease, unspecified: Secondary | ICD-10-CM | POA: Diagnosis not present

## 2017-09-05 DIAGNOSIS — I10 Essential (primary) hypertension: Secondary | ICD-10-CM | POA: Diagnosis not present

## 2017-09-05 DIAGNOSIS — J90 Pleural effusion, not elsewhere classified: Secondary | ICD-10-CM | POA: Diagnosis not present

## 2017-09-05 DIAGNOSIS — D649 Anemia, unspecified: Secondary | ICD-10-CM | POA: Diagnosis not present

## 2017-09-05 DIAGNOSIS — R918 Other nonspecific abnormal finding of lung field: Secondary | ICD-10-CM | POA: Diagnosis not present

## 2017-09-05 DIAGNOSIS — I69320 Aphasia following cerebral infarction: Secondary | ICD-10-CM | POA: Diagnosis not present

## 2017-09-05 DIAGNOSIS — R1310 Dysphagia, unspecified: Secondary | ICD-10-CM | POA: Diagnosis not present

## 2017-09-06 DIAGNOSIS — I10 Essential (primary) hypertension: Secondary | ICD-10-CM | POA: Diagnosis not present

## 2017-09-06 DIAGNOSIS — D649 Anemia, unspecified: Secondary | ICD-10-CM | POA: Diagnosis not present

## 2017-09-06 DIAGNOSIS — J449 Chronic obstructive pulmonary disease, unspecified: Secondary | ICD-10-CM | POA: Diagnosis not present

## 2017-09-06 DIAGNOSIS — I69391 Dysphagia following cerebral infarction: Secondary | ICD-10-CM | POA: Diagnosis not present

## 2017-09-06 DIAGNOSIS — J189 Pneumonia, unspecified organism: Secondary | ICD-10-CM | POA: Diagnosis not present

## 2017-09-06 DIAGNOSIS — J90 Pleural effusion, not elsewhere classified: Secondary | ICD-10-CM | POA: Diagnosis not present

## 2017-09-06 DIAGNOSIS — I69320 Aphasia following cerebral infarction: Secondary | ICD-10-CM | POA: Diagnosis not present

## 2017-09-06 DIAGNOSIS — R918 Other nonspecific abnormal finding of lung field: Secondary | ICD-10-CM | POA: Diagnosis not present

## 2017-09-06 DIAGNOSIS — R1310 Dysphagia, unspecified: Secondary | ICD-10-CM | POA: Diagnosis not present

## 2017-09-07 DIAGNOSIS — D649 Anemia, unspecified: Secondary | ICD-10-CM | POA: Diagnosis not present

## 2017-09-07 DIAGNOSIS — R1310 Dysphagia, unspecified: Secondary | ICD-10-CM | POA: Diagnosis not present

## 2017-09-07 DIAGNOSIS — I69391 Dysphagia following cerebral infarction: Secondary | ICD-10-CM | POA: Diagnosis not present

## 2017-09-07 DIAGNOSIS — R918 Other nonspecific abnormal finding of lung field: Secondary | ICD-10-CM | POA: Diagnosis not present

## 2017-09-07 DIAGNOSIS — J90 Pleural effusion, not elsewhere classified: Secondary | ICD-10-CM | POA: Diagnosis not present

## 2017-09-07 DIAGNOSIS — J449 Chronic obstructive pulmonary disease, unspecified: Secondary | ICD-10-CM | POA: Diagnosis not present

## 2017-09-07 DIAGNOSIS — I10 Essential (primary) hypertension: Secondary | ICD-10-CM | POA: Diagnosis not present

## 2017-09-07 DIAGNOSIS — I69320 Aphasia following cerebral infarction: Secondary | ICD-10-CM | POA: Diagnosis not present

## 2017-09-12 DIAGNOSIS — I69320 Aphasia following cerebral infarction: Secondary | ICD-10-CM | POA: Diagnosis not present

## 2017-09-12 DIAGNOSIS — R1310 Dysphagia, unspecified: Secondary | ICD-10-CM | POA: Diagnosis not present

## 2017-09-12 DIAGNOSIS — D649 Anemia, unspecified: Secondary | ICD-10-CM | POA: Diagnosis not present

## 2017-09-12 DIAGNOSIS — J449 Chronic obstructive pulmonary disease, unspecified: Secondary | ICD-10-CM | POA: Diagnosis not present

## 2017-09-12 DIAGNOSIS — I69391 Dysphagia following cerebral infarction: Secondary | ICD-10-CM | POA: Diagnosis not present

## 2017-09-12 DIAGNOSIS — J90 Pleural effusion, not elsewhere classified: Secondary | ICD-10-CM | POA: Diagnosis not present

## 2017-09-12 DIAGNOSIS — R918 Other nonspecific abnormal finding of lung field: Secondary | ICD-10-CM | POA: Diagnosis not present

## 2017-09-12 DIAGNOSIS — I10 Essential (primary) hypertension: Secondary | ICD-10-CM | POA: Diagnosis not present

## 2017-09-14 DIAGNOSIS — J449 Chronic obstructive pulmonary disease, unspecified: Secondary | ICD-10-CM | POA: Diagnosis not present

## 2017-09-14 DIAGNOSIS — R918 Other nonspecific abnormal finding of lung field: Secondary | ICD-10-CM | POA: Diagnosis not present

## 2017-09-14 DIAGNOSIS — D649 Anemia, unspecified: Secondary | ICD-10-CM | POA: Diagnosis not present

## 2017-09-14 DIAGNOSIS — I10 Essential (primary) hypertension: Secondary | ICD-10-CM | POA: Diagnosis not present

## 2017-09-14 DIAGNOSIS — R1310 Dysphagia, unspecified: Secondary | ICD-10-CM | POA: Diagnosis not present

## 2017-09-14 DIAGNOSIS — I69391 Dysphagia following cerebral infarction: Secondary | ICD-10-CM | POA: Diagnosis not present

## 2017-09-14 DIAGNOSIS — J90 Pleural effusion, not elsewhere classified: Secondary | ICD-10-CM | POA: Diagnosis not present

## 2017-09-14 DIAGNOSIS — I69320 Aphasia following cerebral infarction: Secondary | ICD-10-CM | POA: Diagnosis not present

## 2017-09-15 DIAGNOSIS — R918 Other nonspecific abnormal finding of lung field: Secondary | ICD-10-CM | POA: Diagnosis not present

## 2017-09-15 DIAGNOSIS — D649 Anemia, unspecified: Secondary | ICD-10-CM | POA: Diagnosis not present

## 2017-09-15 DIAGNOSIS — I69320 Aphasia following cerebral infarction: Secondary | ICD-10-CM | POA: Diagnosis not present

## 2017-09-15 DIAGNOSIS — R1310 Dysphagia, unspecified: Secondary | ICD-10-CM | POA: Diagnosis not present

## 2017-09-15 DIAGNOSIS — J90 Pleural effusion, not elsewhere classified: Secondary | ICD-10-CM | POA: Diagnosis not present

## 2017-09-15 DIAGNOSIS — I69391 Dysphagia following cerebral infarction: Secondary | ICD-10-CM | POA: Diagnosis not present

## 2017-09-15 DIAGNOSIS — J449 Chronic obstructive pulmonary disease, unspecified: Secondary | ICD-10-CM | POA: Diagnosis not present

## 2017-09-15 DIAGNOSIS — I10 Essential (primary) hypertension: Secondary | ICD-10-CM | POA: Diagnosis not present

## 2017-09-16 DIAGNOSIS — J9611 Chronic respiratory failure with hypoxia: Secondary | ICD-10-CM | POA: Diagnosis not present

## 2017-09-16 DIAGNOSIS — I69991 Dysphagia following unspecified cerebrovascular disease: Secondary | ICD-10-CM | POA: Diagnosis not present

## 2017-09-16 DIAGNOSIS — J449 Chronic obstructive pulmonary disease, unspecified: Secondary | ICD-10-CM | POA: Diagnosis not present

## 2017-09-16 DIAGNOSIS — M6281 Muscle weakness (generalized): Secondary | ICD-10-CM | POA: Diagnosis not present

## 2017-09-16 DIAGNOSIS — R69 Illness, unspecified: Secondary | ICD-10-CM | POA: Diagnosis not present

## 2017-09-16 DIAGNOSIS — I950 Idiopathic hypotension: Secondary | ICD-10-CM | POA: Diagnosis not present

## 2017-09-16 DIAGNOSIS — I639 Cerebral infarction, unspecified: Secondary | ICD-10-CM | POA: Diagnosis not present

## 2017-09-19 DIAGNOSIS — D649 Anemia, unspecified: Secondary | ICD-10-CM | POA: Diagnosis not present

## 2017-09-19 DIAGNOSIS — I69391 Dysphagia following cerebral infarction: Secondary | ICD-10-CM | POA: Diagnosis not present

## 2017-09-19 DIAGNOSIS — J449 Chronic obstructive pulmonary disease, unspecified: Secondary | ICD-10-CM | POA: Diagnosis not present

## 2017-09-19 DIAGNOSIS — J90 Pleural effusion, not elsewhere classified: Secondary | ICD-10-CM | POA: Diagnosis not present

## 2017-09-19 DIAGNOSIS — I10 Essential (primary) hypertension: Secondary | ICD-10-CM | POA: Diagnosis not present

## 2017-09-19 DIAGNOSIS — R1310 Dysphagia, unspecified: Secondary | ICD-10-CM | POA: Diagnosis not present

## 2017-09-19 DIAGNOSIS — R918 Other nonspecific abnormal finding of lung field: Secondary | ICD-10-CM | POA: Diagnosis not present

## 2017-09-19 DIAGNOSIS — I69320 Aphasia following cerebral infarction: Secondary | ICD-10-CM | POA: Diagnosis not present

## 2017-09-20 DIAGNOSIS — I69320 Aphasia following cerebral infarction: Secondary | ICD-10-CM | POA: Diagnosis not present

## 2017-09-20 DIAGNOSIS — J449 Chronic obstructive pulmonary disease, unspecified: Secondary | ICD-10-CM | POA: Diagnosis not present

## 2017-09-20 DIAGNOSIS — I69391 Dysphagia following cerebral infarction: Secondary | ICD-10-CM | POA: Diagnosis not present

## 2017-09-20 DIAGNOSIS — R918 Other nonspecific abnormal finding of lung field: Secondary | ICD-10-CM | POA: Diagnosis not present

## 2017-09-20 DIAGNOSIS — J90 Pleural effusion, not elsewhere classified: Secondary | ICD-10-CM | POA: Diagnosis not present

## 2017-09-20 DIAGNOSIS — R1310 Dysphagia, unspecified: Secondary | ICD-10-CM | POA: Diagnosis not present

## 2017-09-20 DIAGNOSIS — I10 Essential (primary) hypertension: Secondary | ICD-10-CM | POA: Diagnosis not present

## 2017-09-20 DIAGNOSIS — D649 Anemia, unspecified: Secondary | ICD-10-CM | POA: Diagnosis not present

## 2017-09-22 DIAGNOSIS — R1310 Dysphagia, unspecified: Secondary | ICD-10-CM | POA: Diagnosis not present

## 2017-09-22 DIAGNOSIS — R918 Other nonspecific abnormal finding of lung field: Secondary | ICD-10-CM | POA: Diagnosis not present

## 2017-09-22 DIAGNOSIS — I10 Essential (primary) hypertension: Secondary | ICD-10-CM | POA: Diagnosis not present

## 2017-09-22 DIAGNOSIS — I69320 Aphasia following cerebral infarction: Secondary | ICD-10-CM | POA: Diagnosis not present

## 2017-09-22 DIAGNOSIS — J449 Chronic obstructive pulmonary disease, unspecified: Secondary | ICD-10-CM | POA: Diagnosis not present

## 2017-09-22 DIAGNOSIS — J90 Pleural effusion, not elsewhere classified: Secondary | ICD-10-CM | POA: Diagnosis not present

## 2017-09-22 DIAGNOSIS — I69391 Dysphagia following cerebral infarction: Secondary | ICD-10-CM | POA: Diagnosis not present

## 2017-09-22 DIAGNOSIS — D649 Anemia, unspecified: Secondary | ICD-10-CM | POA: Diagnosis not present

## 2017-09-26 DIAGNOSIS — J90 Pleural effusion, not elsewhere classified: Secondary | ICD-10-CM | POA: Diagnosis not present

## 2017-09-26 DIAGNOSIS — I10 Essential (primary) hypertension: Secondary | ICD-10-CM | POA: Diagnosis not present

## 2017-09-26 DIAGNOSIS — D649 Anemia, unspecified: Secondary | ICD-10-CM | POA: Diagnosis not present

## 2017-09-26 DIAGNOSIS — J449 Chronic obstructive pulmonary disease, unspecified: Secondary | ICD-10-CM | POA: Diagnosis not present

## 2017-09-26 DIAGNOSIS — I69320 Aphasia following cerebral infarction: Secondary | ICD-10-CM | POA: Diagnosis not present

## 2017-09-26 DIAGNOSIS — R918 Other nonspecific abnormal finding of lung field: Secondary | ICD-10-CM | POA: Diagnosis not present

## 2017-09-26 DIAGNOSIS — R1310 Dysphagia, unspecified: Secondary | ICD-10-CM | POA: Diagnosis not present

## 2017-09-26 DIAGNOSIS — I69391 Dysphagia following cerebral infarction: Secondary | ICD-10-CM | POA: Diagnosis not present

## 2017-09-27 DIAGNOSIS — J449 Chronic obstructive pulmonary disease, unspecified: Secondary | ICD-10-CM | POA: Diagnosis not present

## 2017-09-27 DIAGNOSIS — R918 Other nonspecific abnormal finding of lung field: Secondary | ICD-10-CM | POA: Diagnosis not present

## 2017-09-27 DIAGNOSIS — D649 Anemia, unspecified: Secondary | ICD-10-CM | POA: Diagnosis not present

## 2017-09-27 DIAGNOSIS — I69391 Dysphagia following cerebral infarction: Secondary | ICD-10-CM | POA: Diagnosis not present

## 2017-09-27 DIAGNOSIS — I10 Essential (primary) hypertension: Secondary | ICD-10-CM | POA: Diagnosis not present

## 2017-09-27 DIAGNOSIS — J90 Pleural effusion, not elsewhere classified: Secondary | ICD-10-CM | POA: Diagnosis not present

## 2017-09-27 DIAGNOSIS — I69320 Aphasia following cerebral infarction: Secondary | ICD-10-CM | POA: Diagnosis not present

## 2017-09-27 DIAGNOSIS — R1310 Dysphagia, unspecified: Secondary | ICD-10-CM | POA: Diagnosis not present

## 2017-10-03 DIAGNOSIS — J90 Pleural effusion, not elsewhere classified: Secondary | ICD-10-CM | POA: Diagnosis not present

## 2017-10-03 DIAGNOSIS — I69391 Dysphagia following cerebral infarction: Secondary | ICD-10-CM | POA: Diagnosis not present

## 2017-10-03 DIAGNOSIS — D649 Anemia, unspecified: Secondary | ICD-10-CM | POA: Diagnosis not present

## 2017-10-03 DIAGNOSIS — J449 Chronic obstructive pulmonary disease, unspecified: Secondary | ICD-10-CM | POA: Diagnosis not present

## 2017-10-03 DIAGNOSIS — I69320 Aphasia following cerebral infarction: Secondary | ICD-10-CM | POA: Diagnosis not present

## 2017-10-03 DIAGNOSIS — R918 Other nonspecific abnormal finding of lung field: Secondary | ICD-10-CM | POA: Diagnosis not present

## 2017-10-03 DIAGNOSIS — R1310 Dysphagia, unspecified: Secondary | ICD-10-CM | POA: Diagnosis not present

## 2017-10-03 DIAGNOSIS — I10 Essential (primary) hypertension: Secondary | ICD-10-CM | POA: Diagnosis not present

## 2017-10-05 DIAGNOSIS — R1310 Dysphagia, unspecified: Secondary | ICD-10-CM | POA: Diagnosis not present

## 2017-10-05 DIAGNOSIS — I10 Essential (primary) hypertension: Secondary | ICD-10-CM | POA: Diagnosis not present

## 2017-10-05 DIAGNOSIS — I69391 Dysphagia following cerebral infarction: Secondary | ICD-10-CM | POA: Diagnosis not present

## 2017-10-05 DIAGNOSIS — D649 Anemia, unspecified: Secondary | ICD-10-CM | POA: Diagnosis not present

## 2017-10-05 DIAGNOSIS — J90 Pleural effusion, not elsewhere classified: Secondary | ICD-10-CM | POA: Diagnosis not present

## 2017-10-05 DIAGNOSIS — J449 Chronic obstructive pulmonary disease, unspecified: Secondary | ICD-10-CM | POA: Diagnosis not present

## 2017-10-05 DIAGNOSIS — R918 Other nonspecific abnormal finding of lung field: Secondary | ICD-10-CM | POA: Diagnosis not present

## 2017-10-05 DIAGNOSIS — I69320 Aphasia following cerebral infarction: Secondary | ICD-10-CM | POA: Diagnosis not present

## 2017-10-10 DIAGNOSIS — I69391 Dysphagia following cerebral infarction: Secondary | ICD-10-CM | POA: Diagnosis not present

## 2017-10-10 DIAGNOSIS — I69320 Aphasia following cerebral infarction: Secondary | ICD-10-CM | POA: Diagnosis not present

## 2017-10-10 DIAGNOSIS — I10 Essential (primary) hypertension: Secondary | ICD-10-CM | POA: Diagnosis not present

## 2017-10-10 DIAGNOSIS — R1310 Dysphagia, unspecified: Secondary | ICD-10-CM | POA: Diagnosis not present

## 2017-10-10 DIAGNOSIS — R918 Other nonspecific abnormal finding of lung field: Secondary | ICD-10-CM | POA: Diagnosis not present

## 2017-10-10 DIAGNOSIS — J90 Pleural effusion, not elsewhere classified: Secondary | ICD-10-CM | POA: Diagnosis not present

## 2017-10-10 DIAGNOSIS — D649 Anemia, unspecified: Secondary | ICD-10-CM | POA: Diagnosis not present

## 2017-10-10 DIAGNOSIS — J449 Chronic obstructive pulmonary disease, unspecified: Secondary | ICD-10-CM | POA: Diagnosis not present

## 2017-10-17 DIAGNOSIS — J9611 Chronic respiratory failure with hypoxia: Secondary | ICD-10-CM | POA: Diagnosis not present

## 2017-10-17 DIAGNOSIS — M6281 Muscle weakness (generalized): Secondary | ICD-10-CM | POA: Diagnosis not present

## 2017-10-17 DIAGNOSIS — I639 Cerebral infarction, unspecified: Secondary | ICD-10-CM | POA: Diagnosis not present

## 2017-10-17 DIAGNOSIS — I69991 Dysphagia following unspecified cerebrovascular disease: Secondary | ICD-10-CM | POA: Diagnosis not present

## 2017-10-17 DIAGNOSIS — J449 Chronic obstructive pulmonary disease, unspecified: Secondary | ICD-10-CM | POA: Diagnosis not present

## 2017-11-17 DIAGNOSIS — J9611 Chronic respiratory failure with hypoxia: Secondary | ICD-10-CM | POA: Diagnosis not present

## 2017-11-17 DIAGNOSIS — J449 Chronic obstructive pulmonary disease, unspecified: Secondary | ICD-10-CM | POA: Diagnosis not present

## 2017-11-17 DIAGNOSIS — M6281 Muscle weakness (generalized): Secondary | ICD-10-CM | POA: Diagnosis not present

## 2017-11-17 DIAGNOSIS — I69991 Dysphagia following unspecified cerebrovascular disease: Secondary | ICD-10-CM | POA: Diagnosis not present

## 2017-11-17 DIAGNOSIS — I639 Cerebral infarction, unspecified: Secondary | ICD-10-CM | POA: Diagnosis not present

## 2017-11-21 DIAGNOSIS — J9611 Chronic respiratory failure with hypoxia: Secondary | ICD-10-CM | POA: Diagnosis not present

## 2017-11-21 DIAGNOSIS — J449 Chronic obstructive pulmonary disease, unspecified: Secondary | ICD-10-CM | POA: Diagnosis not present

## 2017-11-21 DIAGNOSIS — R42 Dizziness and giddiness: Secondary | ICD-10-CM | POA: Diagnosis not present

## 2017-11-21 DIAGNOSIS — I950 Idiopathic hypotension: Secondary | ICD-10-CM | POA: Diagnosis not present

## 2017-11-21 DIAGNOSIS — N390 Urinary tract infection, site not specified: Secondary | ICD-10-CM | POA: Diagnosis not present

## 2017-11-21 DIAGNOSIS — Z23 Encounter for immunization: Secondary | ICD-10-CM | POA: Diagnosis not present

## 2017-12-17 DIAGNOSIS — I639 Cerebral infarction, unspecified: Secondary | ICD-10-CM | POA: Diagnosis not present

## 2017-12-17 DIAGNOSIS — J9611 Chronic respiratory failure with hypoxia: Secondary | ICD-10-CM | POA: Diagnosis not present

## 2017-12-17 DIAGNOSIS — M6281 Muscle weakness (generalized): Secondary | ICD-10-CM | POA: Diagnosis not present

## 2017-12-17 DIAGNOSIS — J449 Chronic obstructive pulmonary disease, unspecified: Secondary | ICD-10-CM | POA: Diagnosis not present

## 2017-12-17 DIAGNOSIS — I69991 Dysphagia following unspecified cerebrovascular disease: Secondary | ICD-10-CM | POA: Diagnosis not present

## 2018-01-17 DIAGNOSIS — J9611 Chronic respiratory failure with hypoxia: Secondary | ICD-10-CM | POA: Diagnosis not present

## 2018-01-17 DIAGNOSIS — I69991 Dysphagia following unspecified cerebrovascular disease: Secondary | ICD-10-CM | POA: Diagnosis not present

## 2018-01-17 DIAGNOSIS — I639 Cerebral infarction, unspecified: Secondary | ICD-10-CM | POA: Diagnosis not present

## 2018-01-17 DIAGNOSIS — M6281 Muscle weakness (generalized): Secondary | ICD-10-CM | POA: Diagnosis not present

## 2018-01-17 DIAGNOSIS — J449 Chronic obstructive pulmonary disease, unspecified: Secondary | ICD-10-CM | POA: Diagnosis not present

## 2018-02-16 DIAGNOSIS — J449 Chronic obstructive pulmonary disease, unspecified: Secondary | ICD-10-CM | POA: Diagnosis not present

## 2018-02-16 DIAGNOSIS — J9611 Chronic respiratory failure with hypoxia: Secondary | ICD-10-CM | POA: Diagnosis not present

## 2018-02-16 DIAGNOSIS — I69991 Dysphagia following unspecified cerebrovascular disease: Secondary | ICD-10-CM | POA: Diagnosis not present

## 2018-02-16 DIAGNOSIS — M6281 Muscle weakness (generalized): Secondary | ICD-10-CM | POA: Diagnosis not present

## 2018-02-16 DIAGNOSIS — I639 Cerebral infarction, unspecified: Secondary | ICD-10-CM | POA: Diagnosis not present

## 2018-03-19 DIAGNOSIS — I69991 Dysphagia following unspecified cerebrovascular disease: Secondary | ICD-10-CM | POA: Diagnosis not present

## 2018-03-19 DIAGNOSIS — J9611 Chronic respiratory failure with hypoxia: Secondary | ICD-10-CM | POA: Diagnosis not present

## 2018-03-19 DIAGNOSIS — J449 Chronic obstructive pulmonary disease, unspecified: Secondary | ICD-10-CM | POA: Diagnosis not present

## 2018-03-19 DIAGNOSIS — M6281 Muscle weakness (generalized): Secondary | ICD-10-CM | POA: Diagnosis not present

## 2018-03-19 DIAGNOSIS — I639 Cerebral infarction, unspecified: Secondary | ICD-10-CM | POA: Diagnosis not present

## 2018-03-29 DIAGNOSIS — J9611 Chronic respiratory failure with hypoxia: Secondary | ICD-10-CM | POA: Diagnosis not present

## 2018-03-29 DIAGNOSIS — M79642 Pain in left hand: Secondary | ICD-10-CM | POA: Diagnosis not present

## 2018-03-29 DIAGNOSIS — J441 Chronic obstructive pulmonary disease with (acute) exacerbation: Secondary | ICD-10-CM | POA: Diagnosis not present

## 2018-03-29 DIAGNOSIS — M79641 Pain in right hand: Secondary | ICD-10-CM | POA: Diagnosis not present

## 2018-04-10 DIAGNOSIS — J9610 Chronic respiratory failure, unspecified whether with hypoxia or hypercapnia: Secondary | ICD-10-CM | POA: Diagnosis not present

## 2018-04-10 DIAGNOSIS — R110 Nausea: Secondary | ICD-10-CM | POA: Diagnosis not present

## 2018-04-10 DIAGNOSIS — Z7722 Contact with and (suspected) exposure to environmental tobacco smoke (acute) (chronic): Secondary | ICD-10-CM | POA: Diagnosis not present

## 2018-04-10 DIAGNOSIS — R69 Illness, unspecified: Secondary | ICD-10-CM | POA: Diagnosis not present

## 2018-04-10 DIAGNOSIS — Z7952 Long term (current) use of systemic steroids: Secondary | ICD-10-CM | POA: Diagnosis not present

## 2018-04-10 DIAGNOSIS — E785 Hyperlipidemia, unspecified: Secondary | ICD-10-CM | POA: Diagnosis not present

## 2018-04-10 DIAGNOSIS — H547 Unspecified visual loss: Secondary | ICD-10-CM | POA: Diagnosis not present

## 2018-04-10 DIAGNOSIS — I69331 Monoplegia of upper limb following cerebral infarction affecting right dominant side: Secondary | ICD-10-CM | POA: Diagnosis not present

## 2018-04-10 DIAGNOSIS — J449 Chronic obstructive pulmonary disease, unspecified: Secondary | ICD-10-CM | POA: Diagnosis not present

## 2018-04-10 DIAGNOSIS — R32 Unspecified urinary incontinence: Secondary | ICD-10-CM | POA: Diagnosis not present

## 2018-04-19 DIAGNOSIS — J9611 Chronic respiratory failure with hypoxia: Secondary | ICD-10-CM | POA: Diagnosis not present

## 2018-04-19 DIAGNOSIS — J449 Chronic obstructive pulmonary disease, unspecified: Secondary | ICD-10-CM | POA: Diagnosis not present

## 2018-04-19 DIAGNOSIS — M6281 Muscle weakness (generalized): Secondary | ICD-10-CM | POA: Diagnosis not present

## 2018-04-19 DIAGNOSIS — I69991 Dysphagia following unspecified cerebrovascular disease: Secondary | ICD-10-CM | POA: Diagnosis not present

## 2018-04-19 DIAGNOSIS — I639 Cerebral infarction, unspecified: Secondary | ICD-10-CM | POA: Diagnosis not present

## 2018-05-18 DIAGNOSIS — M6281 Muscle weakness (generalized): Secondary | ICD-10-CM | POA: Diagnosis not present

## 2018-05-18 DIAGNOSIS — I639 Cerebral infarction, unspecified: Secondary | ICD-10-CM | POA: Diagnosis not present

## 2018-05-18 DIAGNOSIS — I69991 Dysphagia following unspecified cerebrovascular disease: Secondary | ICD-10-CM | POA: Diagnosis not present

## 2018-05-18 DIAGNOSIS — J449 Chronic obstructive pulmonary disease, unspecified: Secondary | ICD-10-CM | POA: Diagnosis not present

## 2018-06-18 DIAGNOSIS — I639 Cerebral infarction, unspecified: Secondary | ICD-10-CM | POA: Diagnosis not present

## 2018-06-18 DIAGNOSIS — M6281 Muscle weakness (generalized): Secondary | ICD-10-CM | POA: Diagnosis not present

## 2018-06-18 DIAGNOSIS — J449 Chronic obstructive pulmonary disease, unspecified: Secondary | ICD-10-CM | POA: Diagnosis not present

## 2018-06-18 DIAGNOSIS — I69991 Dysphagia following unspecified cerebrovascular disease: Secondary | ICD-10-CM | POA: Diagnosis not present

## 2018-06-29 DIAGNOSIS — I10 Essential (primary) hypertension: Secondary | ICD-10-CM | POA: Diagnosis not present

## 2018-06-29 DIAGNOSIS — I639 Cerebral infarction, unspecified: Secondary | ICD-10-CM | POA: Diagnosis not present

## 2018-06-29 DIAGNOSIS — J449 Chronic obstructive pulmonary disease, unspecified: Secondary | ICD-10-CM | POA: Diagnosis not present

## 2018-07-18 DIAGNOSIS — J449 Chronic obstructive pulmonary disease, unspecified: Secondary | ICD-10-CM | POA: Diagnosis not present

## 2018-07-18 DIAGNOSIS — I69991 Dysphagia following unspecified cerebrovascular disease: Secondary | ICD-10-CM | POA: Diagnosis not present

## 2018-07-18 DIAGNOSIS — M6281 Muscle weakness (generalized): Secondary | ICD-10-CM | POA: Diagnosis not present

## 2018-07-18 DIAGNOSIS — I639 Cerebral infarction, unspecified: Secondary | ICD-10-CM | POA: Diagnosis not present

## 2018-08-18 DIAGNOSIS — M6281 Muscle weakness (generalized): Secondary | ICD-10-CM | POA: Diagnosis not present

## 2018-08-18 DIAGNOSIS — I69991 Dysphagia following unspecified cerebrovascular disease: Secondary | ICD-10-CM | POA: Diagnosis not present

## 2018-08-18 DIAGNOSIS — I639 Cerebral infarction, unspecified: Secondary | ICD-10-CM | POA: Diagnosis not present

## 2018-08-18 DIAGNOSIS — J449 Chronic obstructive pulmonary disease, unspecified: Secondary | ICD-10-CM | POA: Diagnosis not present

## 2018-09-17 DIAGNOSIS — I69991 Dysphagia following unspecified cerebrovascular disease: Secondary | ICD-10-CM | POA: Diagnosis not present

## 2018-09-17 DIAGNOSIS — I639 Cerebral infarction, unspecified: Secondary | ICD-10-CM | POA: Diagnosis not present

## 2018-09-17 DIAGNOSIS — J449 Chronic obstructive pulmonary disease, unspecified: Secondary | ICD-10-CM | POA: Diagnosis not present

## 2018-09-17 DIAGNOSIS — M6281 Muscle weakness (generalized): Secondary | ICD-10-CM | POA: Diagnosis not present

## 2018-09-28 DIAGNOSIS — J9611 Chronic respiratory failure with hypoxia: Secondary | ICD-10-CM | POA: Diagnosis not present

## 2018-09-28 DIAGNOSIS — J449 Chronic obstructive pulmonary disease, unspecified: Secondary | ICD-10-CM | POA: Diagnosis not present

## 2018-09-28 DIAGNOSIS — I950 Idiopathic hypotension: Secondary | ICD-10-CM | POA: Diagnosis not present

## 2018-09-28 DIAGNOSIS — R69 Illness, unspecified: Secondary | ICD-10-CM | POA: Diagnosis not present

## 2018-10-18 DIAGNOSIS — I69991 Dysphagia following unspecified cerebrovascular disease: Secondary | ICD-10-CM | POA: Diagnosis not present

## 2018-10-18 DIAGNOSIS — I639 Cerebral infarction, unspecified: Secondary | ICD-10-CM | POA: Diagnosis not present

## 2018-10-18 DIAGNOSIS — M6281 Muscle weakness (generalized): Secondary | ICD-10-CM | POA: Diagnosis not present

## 2018-10-18 DIAGNOSIS — J449 Chronic obstructive pulmonary disease, unspecified: Secondary | ICD-10-CM | POA: Diagnosis not present

## 2018-11-18 DIAGNOSIS — M6281 Muscle weakness (generalized): Secondary | ICD-10-CM | POA: Diagnosis not present

## 2018-11-18 DIAGNOSIS — I639 Cerebral infarction, unspecified: Secondary | ICD-10-CM | POA: Diagnosis not present

## 2018-11-18 DIAGNOSIS — J449 Chronic obstructive pulmonary disease, unspecified: Secondary | ICD-10-CM | POA: Diagnosis not present

## 2018-11-18 DIAGNOSIS — I69991 Dysphagia following unspecified cerebrovascular disease: Secondary | ICD-10-CM | POA: Diagnosis not present

## 2018-12-18 DIAGNOSIS — I639 Cerebral infarction, unspecified: Secondary | ICD-10-CM | POA: Diagnosis not present

## 2018-12-18 DIAGNOSIS — J449 Chronic obstructive pulmonary disease, unspecified: Secondary | ICD-10-CM | POA: Diagnosis not present

## 2018-12-18 DIAGNOSIS — M6281 Muscle weakness (generalized): Secondary | ICD-10-CM | POA: Diagnosis not present

## 2018-12-18 DIAGNOSIS — I69991 Dysphagia following unspecified cerebrovascular disease: Secondary | ICD-10-CM | POA: Diagnosis not present

## 2018-12-26 ENCOUNTER — Other Ambulatory Visit: Payer: Self-pay

## 2018-12-26 ENCOUNTER — Emergency Department (HOSPITAL_COMMUNITY): Payer: No Typology Code available for payment source

## 2018-12-26 ENCOUNTER — Other Ambulatory Visit (HOSPITAL_COMMUNITY): Payer: Medicare HMO

## 2018-12-26 ENCOUNTER — Emergency Department (HOSPITAL_COMMUNITY)
Admission: EM | Admit: 2018-12-26 | Discharge: 2018-12-26 | Disposition: A | Discharge status: Home / Self Care | Payer: No Typology Code available for payment source | Attending: Emergency Medicine | Admitting: Emergency Medicine

## 2018-12-26 ENCOUNTER — Encounter (HOSPITAL_COMMUNITY): Payer: Self-pay | Admitting: Emergency Medicine

## 2018-12-26 DIAGNOSIS — Y929 Unspecified place or not applicable: Secondary | ICD-10-CM | POA: Diagnosis not present

## 2018-12-26 DIAGNOSIS — I10 Essential (primary) hypertension: Secondary | ICD-10-CM | POA: Insufficient documentation

## 2018-12-26 DIAGNOSIS — F17210 Nicotine dependence, cigarettes, uncomplicated: Secondary | ICD-10-CM | POA: Diagnosis not present

## 2018-12-26 DIAGNOSIS — Z20828 Contact with and (suspected) exposure to other viral communicable diseases: Secondary | ICD-10-CM | POA: Diagnosis not present

## 2018-12-26 DIAGNOSIS — W19XXXA Unspecified fall, initial encounter: Secondary | ICD-10-CM | POA: Diagnosis not present

## 2018-12-26 DIAGNOSIS — R531 Weakness: Secondary | ICD-10-CM | POA: Diagnosis not present

## 2018-12-26 DIAGNOSIS — T07XXXA Unspecified multiple injuries, initial encounter: Secondary | ICD-10-CM | POA: Diagnosis not present

## 2018-12-26 DIAGNOSIS — Z79899 Other long term (current) drug therapy: Secondary | ICD-10-CM | POA: Insufficient documentation

## 2018-12-26 DIAGNOSIS — R0902 Hypoxemia: Secondary | ICD-10-CM | POA: Insufficient documentation

## 2018-12-26 DIAGNOSIS — J449 Chronic obstructive pulmonary disease, unspecified: Secondary | ICD-10-CM | POA: Diagnosis not present

## 2018-12-26 DIAGNOSIS — Y939 Activity, unspecified: Secondary | ICD-10-CM | POA: Insufficient documentation

## 2018-12-26 DIAGNOSIS — W1830XA Fall on same level, unspecified, initial encounter: Secondary | ICD-10-CM | POA: Insufficient documentation

## 2018-12-26 DIAGNOSIS — Z7982 Long term (current) use of aspirin: Secondary | ICD-10-CM | POA: Diagnosis not present

## 2018-12-26 DIAGNOSIS — R52 Pain, unspecified: Secondary | ICD-10-CM

## 2018-12-26 DIAGNOSIS — F0390 Unspecified dementia without behavioral disturbance: Secondary | ICD-10-CM | POA: Diagnosis not present

## 2018-12-26 DIAGNOSIS — R918 Other nonspecific abnormal finding of lung field: Secondary | ICD-10-CM

## 2018-12-26 DIAGNOSIS — Y999 Unspecified external cause status: Secondary | ICD-10-CM | POA: Insufficient documentation

## 2018-12-26 DIAGNOSIS — T1490XA Injury, unspecified, initial encounter: Secondary | ICD-10-CM | POA: Diagnosis present

## 2018-12-26 LAB — COMPREHENSIVE METABOLIC PANEL
ALT: 20 U/L (ref 0–44)
AST: 23 U/L (ref 15–41)
Albumin: 2.8 g/dL — ABNORMAL LOW (ref 3.5–5.0)
Alkaline Phosphatase: 64 U/L (ref 38–126)
Anion gap: 12 (ref 5–15)
BUN: 27 mg/dL — ABNORMAL HIGH (ref 8–23)
CO2: 29 mmol/L (ref 22–32)
Calcium: 11.8 mg/dL — ABNORMAL HIGH (ref 8.9–10.3)
Chloride: 101 mmol/L (ref 98–111)
Creatinine, Ser: 1.17 mg/dL (ref 0.61–1.24)
GFR calc Af Amer: >60 mL/min (ref >60)
GFR calc non Af Amer: >60 mL/min (ref >60)
Glucose, Bld: 99 mg/dL (ref 70–99)
Potassium: 4.1 mmol/L (ref 3.5–5.1)
Sodium: 142 mmol/L (ref 135–145)
Total Bilirubin: 0.7 mg/dL (ref 0.3–1.2)
Total Protein: 8.9 g/dL — ABNORMAL HIGH (ref 6.5–8.1)

## 2018-12-26 LAB — CBC WITH DIFFERENTIAL/PLATELET
Abs Immature Granulocytes: 0.05 10*3/uL (ref 0.00–0.07)
Basophils Absolute: 0.1 10*3/uL (ref 0.0–0.1)
Basophils Relative: 0 %
Eosinophils Absolute: 0 10*3/uL (ref 0.0–0.5)
Eosinophils Relative: 0 %
HCT: 38.5 % — ABNORMAL LOW (ref 39.0–52.0)
Hemoglobin: 11.2 g/dL — ABNORMAL LOW (ref 13.0–17.0)
Immature Granulocytes: 0 %
Lymphocytes Relative: 17 %
Lymphs Abs: 2 10*3/uL (ref 0.7–4.0)
MCH: 22.7 pg — ABNORMAL LOW (ref 26.0–34.0)
MCHC: 29.1 g/dL — ABNORMAL LOW (ref 30.0–36.0)
MCV: 77.9 fL — ABNORMAL LOW (ref 80.0–100.0)
Monocytes Absolute: 0.3 10*3/uL (ref 0.1–1.0)
Monocytes Relative: 3 %
Neutro Abs: 9.1 10*3/uL — ABNORMAL HIGH (ref 1.7–7.7)
Neutrophils Relative %: 80 %
Platelets: 461 10*3/uL — ABNORMAL HIGH (ref 150–400)
RBC: 4.94 MIL/uL (ref 4.22–5.81)
RDW: 17.3 % — ABNORMAL HIGH (ref 11.5–15.5)
WBC: 11.5 10*3/uL — ABNORMAL HIGH (ref 4.0–10.5)
nRBC: 0 % (ref 0.0–0.2)

## 2018-12-26 LAB — TROPONIN I (HIGH SENSITIVITY)
Troponin I (High Sensitivity): 8 ng/L (ref <18)
Troponin I (High Sensitivity): 7 ng/L (ref <18)

## 2018-12-26 LAB — URINALYSIS, ROUTINE W REFLEX MICROSCOPIC
Bacteria, UA: NONE SEEN
Bilirubin Urine: NEGATIVE
Glucose, UA: NEGATIVE mg/dL
Ketones, ur: NEGATIVE mg/dL
Nitrite: NEGATIVE
Protein, ur: 30 mg/dL — AB
RBC / HPF: >50 RBC/hpf — ABNORMAL HIGH (ref 0–5)
Specific Gravity, Urine: 1.013 (ref 1.005–1.030)
pH: 5 (ref 5.0–8.0)

## 2018-12-26 LAB — SARS CORONAVIRUS 2 BY RT PCR (HOSPITAL ORDER, PERFORMED IN ~~LOC~~ HOSPITAL LAB): SARS Coronavirus 2: NEGATIVE

## 2018-12-26 LAB — LACTIC ACID, PLASMA: Lactic Acid, Venous: 1.6 mmol/L (ref 0.5–1.9)

## 2018-12-26 LAB — CK: Total CK: 248 U/L (ref 49–397)

## 2018-12-26 MED ORDER — CIPROFLOXACIN HCL 500 MG PO TABS: 500.0000 mg | ORAL_TABLET | Freq: Two times a day (BID) | ORAL | 0 refills | Status: AC

## 2018-12-26 MED ORDER — IOHEXOL 300 MG/ML  SOLN
100.0000 mL | Freq: Once | INTRAMUSCULAR | Status: AC | PRN
  Administered 2018-12-26: 16:00:00 100 mL via INTRAVENOUS

## 2018-12-26 MED ORDER — CIPROFLOXACIN HCL 250 MG PO TABS
500.0000 mg | ORAL_TABLET | Freq: Once | ORAL | Status: AC
  Administered 2018-12-26: 19:00:00 500 mg via ORAL
  Filled 2018-12-26: qty 2

## 2018-12-26 NOTE — ED Notes (Signed)
[facility] here to transport patient home

## 2018-12-26 NOTE — ED Triage Notes (Signed)
Per EMS, pt from home. Pt lives with brother. Initial call was for a fall that occurred at 0400 this morning. Brother also reports increased AMS x 2 days. Pt hx of strokes, with mental decline and RT arm deficits since stroke. EMS states pt was found lying beside of bed, but alert. Pt also noted to be covered in old urine. CBG 123.

## 2018-12-26 NOTE — Discharge Instructions (Addendum)
Your x-ray and CT scan show worsening of the mass of the right lung.  The other x-rays and CT scans are negative for any injury as a result of the fall.  Please discuss this as well as in-home palliative care with Dr. Luan Pulling, and/or Dr.pantea as soon as possible.  Please return to the emergency department if difficulty with controlling pain, or if there is changes in your condition before you are seen by Dr. Luan Pulling or Dr.Pantea.

## 2018-12-26 NOTE — ED Provider Notes (Signed)
[facility] Provider Note   CSN: [id number] Arrival date & time: 12/26/18  [id number]     History   Chief Complaint Chief Complaint  Patient presents with   Fall    HPI Melvin Hart is a 73 y.o. male.     HPI   Pt is a 73 y/o male with a h/o COPD, CVA, HTN, who presents to the ED today for eval after a fall that occurred this AM.   Pt is oriented only to self, therefore hx is limited and there is a level 5 caveat.   He is denying and pain.   10:56 AM Discussed case with the patient's brother, Enis Slipper, who states that for the last few days he has been acting like he is in pain when they try to move him. They also feel like his mental status has changed over the last few days. He is normally able to have conversation. He states patient is supposed to be on oxygen but he has not used it for a year.    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   History of Gout 10/12/2010   History of Venereal disease 10/12/2010   Hypertension     Patient Active Problem List   Diagnosis Date Noted   HCAP (healthcare-associated pneumonia) 123456   Acute metabolic encephalopathy 123456   Confusion    Hypoxia    Palliative care by specialist    Goals of care, counseling/discussion    DNR (do not resuscitate) discussion    Cerebrovascular accident (CVA) ([place])    Palliative care encounter    Lung mass 07/25/2017   Pleural effusion 07/25/2017   History of Venereal disease 10/12/2010   History of Gout 10/12/2010   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 Authorizing Provider  aspirin 325 MG tablet Take 1 tablet (325 mg total) by mouth daily. Patient taking differently: Take 81 mg by mouth daily.  07/30/17  Yes Isaac Bliss, Rayford Halsted, MD  atorvastatin (LIPITOR) 40 MG tablet Take 1 tablet (40 mg total) by mouth daily at 6 PM. 07/29/17  Yes Isaac Bliss, Rayford Halsted, MD  fludrocortisone (FLORINEF) 0.1 MG tablet Take 0.1 mg by mouth daily. 1 tablet PO QD   Yes [provider]  thiamine (VITAMIN B-1) 100 MG tablet Take 1 tablet (100 mg total) by mouth daily. 08/01/17  Yes Isaac Bliss, Rayford Halsted, MD  mometasone-formoterol [facility]) 200-5 MCG/ACT AERO Inhale 2 puffs into the lungs 2 (two) times daily. Patient not taking: Reported on 12/26/2018 07/29/17   Isaac Bliss, Rayford Halsted, MD  QUEtiapine (SEROQUEL) 25 MG tablet Take 1 tablet (25 mg total) by mouth at bedtime. 08/20/17 09/19/17  Murlean Iba, MD  triamcinolone cream (KENALOG) 0.5 % Apply to rash on back three times daily as needed for itching Patient not taking: Reported on 12/26/2018 08/20/17   Murlean Iba, MD    Family History No family history on file.  Social History Social History   Tobacco Use  Smoking status: Current Every Day Smoker    Packs/day: 1.00    Types: Cigarettes   Smokeless tobacco: Never Used  Substance Use Topics   Alcohol use: Not Currently    Comment: daily-quit 1 month ago 07/25/17   Drug use: No     Allergies   Cephalexin and Codeine   Review of Systems Review of Systems  Unable to perform ROS: Mental status change     Physical Exam Updated Vital Signs BP 116/69    Pulse 90    Temp 98.7 F (37.1 C) (Rectal)    Resp 20    Ht 5\' 8"  (1.727 m)    Wt 80 kg    SpO2 100%    BMI 26.82 kg/m   Physical Exam Vitals signs and nursing note reviewed.  Constitutional:      Appearance: He is well-developed.  HENT:     Head: Normocephalic and atraumatic.  Eyes:      Conjunctiva/sclera: Conjunctivae normal.  Neck:     Musculoskeletal: Neck supple.  Cardiovascular:     Rate and Rhythm: Normal rate and regular rhythm.     Pulses: Normal pulses.     Heart sounds: Normal heart sounds. No murmur.  Pulmonary:     Effort: Pulmonary effort is normal.     Breath sounds: Decreased breath sounds present.     Comments: Wet cough exam Abdominal:     General: Bowel sounds are normal.     Palpations: Abdomen is soft.     Tenderness: There is no abdominal tenderness.  Musculoskeletal:     Right lower leg: No edema.     Left lower leg: No edema.     Comments: TTP to the right hip. No thoracic or lumbar spine.   Skin:    General: Skin is warm and dry.  Neurological:     Mental Status: He is alert.     Comments: Alert, oriented to self, demented, following commands intermittently therefore neuro exam is limited. RUE strength is decreased. Moving all extremities. Right eye ptosis.       ED Treatments / Results  Labs (all labs ordered are listed, but only abnormal results are displayed) Labs Reviewed  COMPREHENSIVE METABOLIC PANEL - Abnormal; Notable for the following components:      Result Value   BUN 27 (*)    Calcium 11.8 (*)    Total Protein 8.9 (*)    Albumin 2.8 (*)    All other components within normal limits  CBC WITH DIFFERENTIAL/PLATELET - Abnormal; Notable for the following components:   WBC 11.5 (*)    Hemoglobin 11.2 (*)    HCT 38.5 (*)    MCV 77.9 (*)    MCH 22.7 (*)    MCHC 29.1 (*)    RDW 17.3 (*)    Platelets 461 (*)    Neutro Abs 9.1 (*)    All other components within normal limits  URINALYSIS, ROUTINE W REFLEX MICROSCOPIC - Abnormal; Notable for the following components:   APPearance HAZY (*)    Hgb urine dipstick MODERATE (*)    Protein, ur 30 (*)    Leukocytes,Ua TRACE (*)    RBC / HPF >50 (*)    All other components within normal limits  SARS CORONAVIRUS 2 BY RT PCR ([facility])   URINE CULTURE  CK  LACTIC ACID, PLASMA  TROPONIN I (HIGH SENSITIVITY)  TROPONIN I (HIGH SENSITIVITY)    EKG EKG Interpretation  Date/Time:  Tuesday December 26 2018 12:12:42 EDT Ventricular Rate:  92 PR Interval:    QRS Duration: 101 QT Interval:  362 QTC Calculation: 448 R Axis:   48 Text Interpretation:  Sinus rhythm Ventricular premature complex Abnormal R-wave progression, early transition No STEMI  Confirmed by Nanda Quinton [phone]) on 12/26/2018 1:03:11 PM   Radiology Ct Head Wo Contrast  Result Date: 12/26/2018 CLINICAL DATA:  Posttraumatic headache. Status post fall this morning EXAM: CT HEAD WITHOUT CONTRAST CT CERVICAL SPINE WITHOUT CONTRAST TECHNIQUE: Multidetector CT imaging of the head and cervical spine was performed following the standard protocol without intravenous contrast. Multiplanar CT image reconstructions of the cervical spine were also generated. COMPARISON:  07/25/2017 FINDINGS: CT HEAD FINDINGS Brain: No evidence of acute infarction, hemorrhage, extra-axial collection, ventriculomegaly, or mass effect. Small left basal ganglia lacunar infarct. Old left parietal infarct with dystrophic calcifications and encephalomalacia. Generalized cerebral atrophy. Periventricular white matter low attenuation likely secondary to microangiopathy. Vascular: Cerebrovascular atherosclerotic calcifications are noted. Skull: Negative for fracture or focal lesion. Sinuses/Orbits: Visualized portions of the orbits are unremarkable. Visualized portions of the paranasal sinuses and mastoid air cells are unremarkable. Other: None. CT CERVICAL SPINE FINDINGS Alignment: Normal. Skull base and vertebrae: No acute fracture. No primary bone lesion or focal pathologic process. Soft tissues and spinal canal: No prevertebral fluid or swelling. No visible canal hematoma. Disc levels: Anterior cervical fusion at C5-6. Degenerative disease with disc height loss at C4-5 and C6-7. At C2-3 there is  ankylosis of the right facet joint with hypertrophic changes and with right foraminal narrowing. At C3-4 there is bilateral uncovertebral degenerative changes, moderate bilateral facet arthropathy, and moderate bilateral foraminal stenosis. At C4-5 there is a broad-based disc osteophyte complex, severe right facet hypertrophic changes, severe right foraminal stenosis and moderate left foraminal stenosis. At C6-7 there is interbody fusion and moderate bilateral facet arthropathy. At C6-7 there is severe bilateral foraminal stenosis. Ankylosis of the posterior elements at C4-5, C5-6 and C6-7. Upper chest: Negative. Other: No fluid collection or hematoma. Bilateral carotid artery atherosclerosis. IMPRESSION: 1. No acute intracranial pathology. 2. No acute osseous injury of the cervical spine. Electronically Signed   By: Kathreen Devoid   On: 12/26/2018 13:54   Ct Cervical Spine Wo Contrast  Result Date: 12/26/2018 CLINICAL DATA:  Posttraumatic headache. Status post fall this morning EXAM: CT HEAD WITHOUT CONTRAST CT CERVICAL SPINE WITHOUT CONTRAST TECHNIQUE: Multidetector CT imaging of the head and cervical spine was performed following the standard protocol without intravenous contrast. Multiplanar CT image reconstructions of the cervical spine were also generated. COMPARISON:  07/25/2017 FINDINGS: CT HEAD FINDINGS Brain: No evidence of acute infarction, hemorrhage, extra-axial collection, ventriculomegaly, or mass effect. Small left basal ganglia lacunar infarct. Old left parietal infarct with dystrophic calcifications and encephalomalacia. Generalized cerebral atrophy. Periventricular white matter low attenuation likely secondary to microangiopathy. Vascular: Cerebrovascular atherosclerotic calcifications are noted. Skull: Negative for fracture or focal lesion. Sinuses/Orbits: Visualized portions of the orbits are unremarkable. Visualized portions of the paranasal sinuses and mastoid air cells are unremarkable.  Other: None. CT CERVICAL SPINE FINDINGS Alignment: Normal. Skull base and vertebrae: No acute fracture. No primary bone lesion or focal pathologic process. Soft tissues and spinal canal: No prevertebral fluid or swelling. No visible canal hematoma. Disc levels: Anterior cervical fusion at C5-6. Degenerative disease with disc height loss at C4-5 and C6-7. At C2-3 there is ankylosis of the right facet joint with hypertrophic changes and with right foraminal narrowing. At C3-4 there is bilateral  uncovertebral degenerative changes, moderate bilateral facet arthropathy, and moderate bilateral foraminal stenosis. At C4-5 there is a broad-based disc osteophyte complex, severe right facet hypertrophic changes, severe right foraminal stenosis and moderate left foraminal stenosis. At C6-7 there is interbody fusion and moderate bilateral facet arthropathy. At C6-7 there is severe bilateral foraminal stenosis. Ankylosis of the posterior elements at C4-5, C5-6 and C6-7. Upper chest: Negative. Other: No fluid collection or hematoma. Bilateral carotid artery atherosclerosis. IMPRESSION: 1. No acute intracranial pathology. 2. No acute osseous injury of the cervical spine. Electronically Signed   By: Kathreen Devoid   On: 12/26/2018 13:54   Dg Chest Port 1 View  Result Date: 12/26/2018 CLINICAL DATA:  Cough.  Fall. EXAM: PORTABLE CHEST 1 VIEW COMPARISON:  08/16/2017 FINDINGS: Worsening aeration on the right with increasing large right pleural effusion and diffuse right lung airspace disease. No confluent opacity on the left. Heart is normal size. No acute bony abnormality. No pneumothorax. IMPRESSION: Worsening aeration on the right with increasing large right pleural effusion and diffuse right lung airspace disease. Electronically Signed   By: Rolm Baptise M.D.   On: 12/26/2018 12:21   Dg Hip Unilat With Pelvis 2-3 Views Right  Result Date: 12/26/2018 CLINICAL DATA:  Fall.  Right hip pain EXAM: DG HIP (WITH OR WITHOUT  PELVIS) 2-3V RIGHT COMPARISON:  None. FINDINGS: No acute bony abnormality. Specifically, no fracture, subluxation, or dislocation. Early degenerative changes in the hip joints bilaterally with early spurring. SI joints symmetric and unremarkable. Diffuse vascular calcifications. IMPRESSION: No acute bony abnormality. Electronically Signed   By: Rolm Baptise M.D.   On: 12/26/2018 12:25    Procedures Procedures (including critical care time)  Medications Ordered in ED Medications  iohexol (OMNIPAQUE) 300 MG/ML solution 100 mL (100 mLs Intravenous Contrast Given 12/26/18 1612)     Initial Impression / Assessment and Plan / ED Course  I have reviewed the triage vital signs and the nursing notes.  Pertinent labs & imaging results that were available during my care of the patient were reviewed by me and considered in my medical decision making (see chart for details).   Final Clinical Impressions(s) / ED Diagnoses   Final diagnoses:  Hypoxia   73 y/o male presents to the ED after unwitnessed at home. Has h/o dementia and has been more confused at home therefor history is limited.   VS are reassuring.   CBC with mild leukocytosis, mild anemia CMP with mildly elevated BUN with normal Cr. Liver enzymes normal.  CK wnl Trop wnl Lactic wnl  COVID negative  EKG Sinus rhythm Ventricular premature complex Abnormal R-wave progression, early transition No STEMI    CXR with Worsening aeration on the right with increasing large right pleural effusion and diffuse right lung airspace disease. Pelvis xray negative  CT head/cervical spine w/o acute intracranial pathology or osseous injury of the cervical spine.   4:36 PM CONSULT with IR who reviewed CXR and states that pt does not have pleural effusion but has large pulmonary mass.   CT chest/abd pelvis pending at shift change. Plan for admission for further tx of hypoxia likely secondary to large lung mass. Care transitioned to Lily Kocher at  shift change pending images and admission.   ED Discharge Orders    None       Rodney Booze, [place] 12/26/18 1640    Margette Fast, MD 12/27/18 [phone]

## 2018-12-26 NOTE — ED Provider Notes (Signed)
Patient received at the end of shift.  Patient is a 73 year old male who presented to the emergency department following a fall.  The family also reported that the this seemed to have been some changes in his mental status over the last couple of days.  They also noted that the patient is supposed to be on oxygen at home, but has not been using it for nearly a year.  Work-up is in progress.  CT chest and abdomen pending.  There was a abnormality of the chest x-ray, with worsening aeration on the right with increased large right pleural effusion and diffuse right airspace disease.  A CT scan was suggested.  CT scan of the chest shows a large mass involving the entirety of the right upper lobe with associated mediastinal and hilar invasion.  The mass encases the right mainstem bronchus and obstructs the right upper lobe and right middle lobe airways.  There is significant volume loss within the right lung with a rightward shift of the mediastinum.  There is tumor extension into the right lower lobe and suspected pleural spread of disease with right pleural thickening and loculated right pleural effusion.  CT scan of the abdomen is negative for signs suggesting metastatic disease of the abdomen or pelvis.  There are no suspicious bone lesions noted.  There is noted gallstones present.  I reviewed the previous records.  The patient had mass of the lung approximately a year ago.  It was suggested that he have follow-up.  The family states that he did not want to have follow-up at that time.  They also report that the patient was found to have some abnormalities about 4 years ago during a [facility] hospital visit.  The patient did not go back to the [facility] for this issue, and has not sought any additional help.  I have discussed these findings with the patient and the brother  in detail.  The patient and the family state that they want to go home.  They would like to investigate  palliative care at home. Case reviewed and discussed with Dr Lacinda Axon.  I have sent a secure chat message to Dr. Luan Pulling, the primary care physician indicating the patient and family's wishes, and asking for his assistance with securing the patient and the family's wishes.  Dx: 1.  Right lung mass with mediastinal and hilar invasion.  2.  Fall 3.  Contusion multiple sites   Lily Kocher, Hershal Coria 12/26/18 1846    Nat Christen, MD 12/27/18 [phone]

## 2018-12-27 LAB — URINE CULTURE: Culture: NO GROWTH

## 2019-02-13 DEATH — deceased

## 2019-12-24 IMAGING — CR DG CHEST 1V PORT
1 series · 1 of 1 positions shown · non-contrast
Comparison: 08/16/2017

CLINICAL DATA: Cough.  Fall.

EXAM:
PORTABLE CHEST 1 VIEW

[portable]
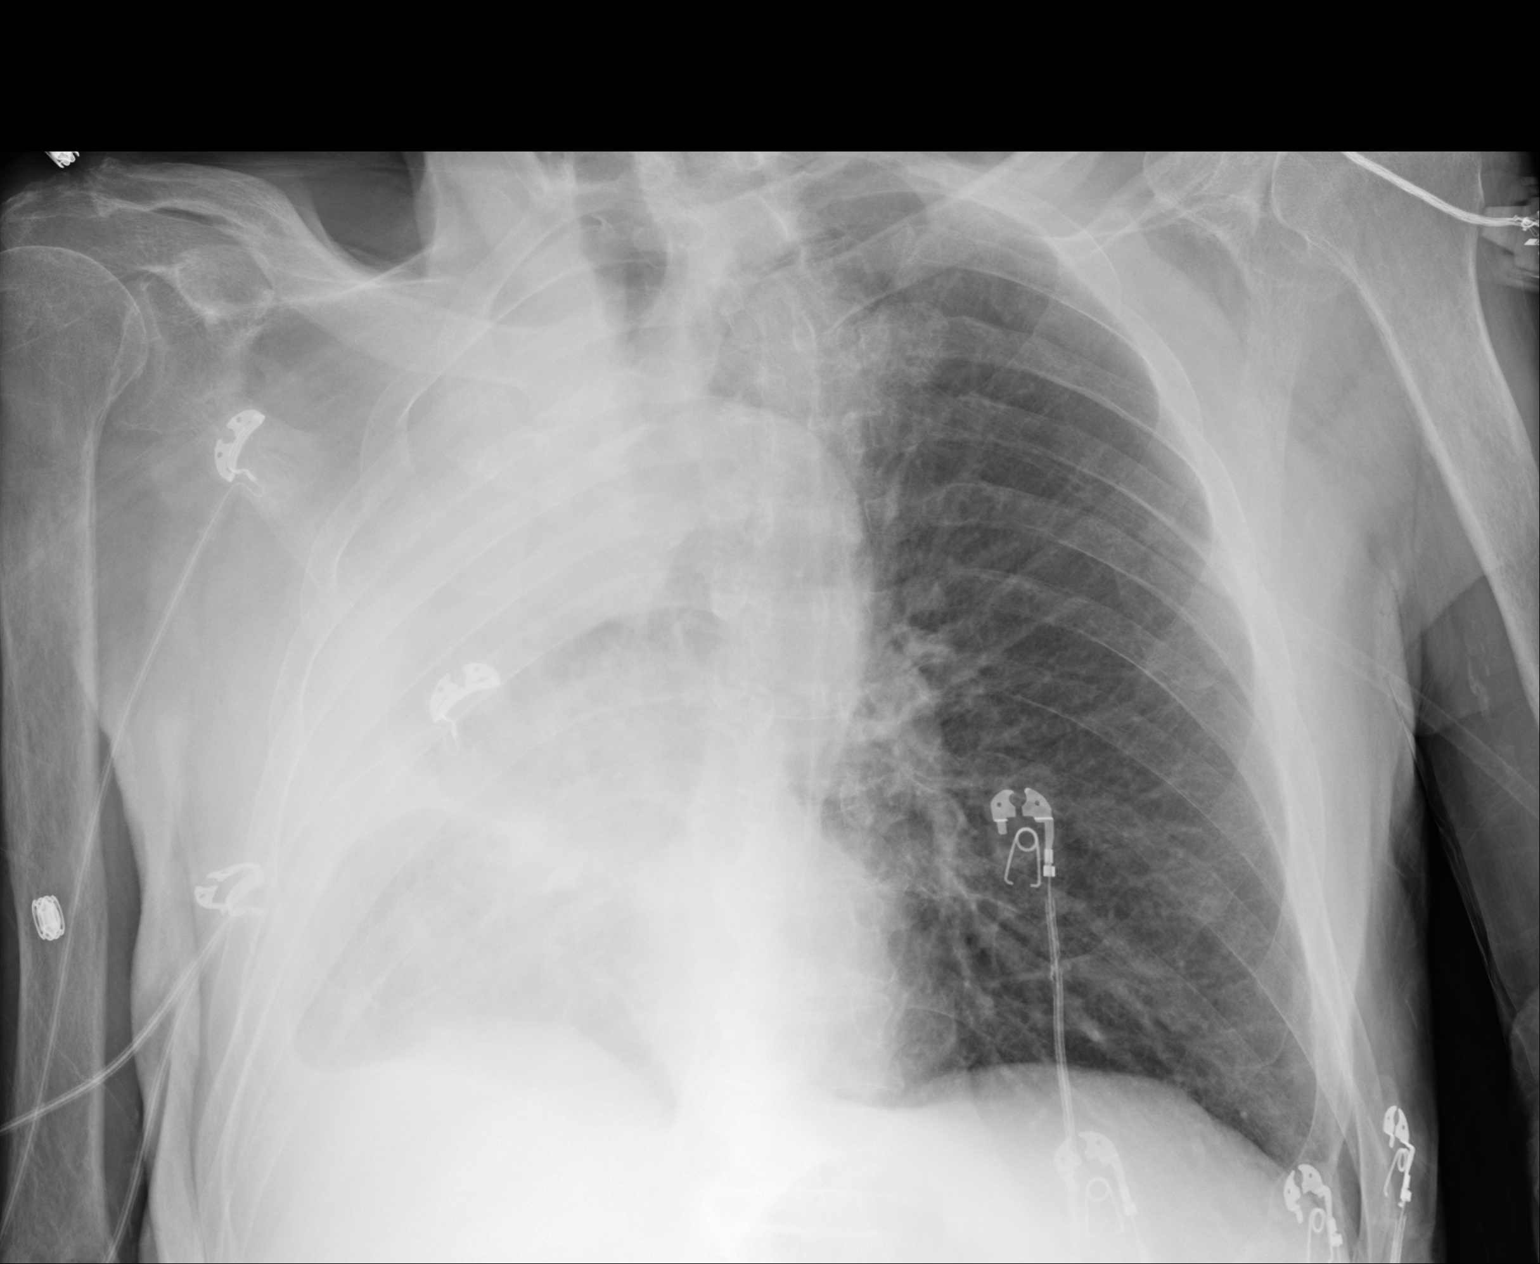

[1 of 1 positions shown; findings below may reference images not displayed]

FINDINGS: Worsening aeration on the right with increasing large right pleural
effusion and diffuse right lung airspace disease. No confluent
opacity on the left. Heart is normal size. No acute bony
abnormality. No pneumothorax.
IMPRESSION: Worsening aeration on the right with increasing large right pleural
effusion and diffuse right lung airspace disease.

## 2019-12-24 IMAGING — CT CT CHEST W/ CM
3 of 5 series · 13 of 36 positions shown, 15 images · IV contrast (omnipaque)
Comparison: 07/25/2017

CLINICAL DATA: Right pleural effusion.  Evaluate for malignancy.

EXAM:
CT CHEST, ABDOMEN, AND PELVIS WITH CONTRAST
TECHNIQUE: Multidetector CT imaging of the chest, abdomen and pelvis was
performed following the standard protocol during bolus
administration of intravenous contrast.
CONTRAST:  100mL OMNIPAQUE IOHEXOL 300 MG/ML  SOLN

[Series 2: cap with · axial · 0.82mm/px · z∈[-474,+21]mm · 8 of 129 slices shown, 10 images]
[im 15/129  mediastinal]
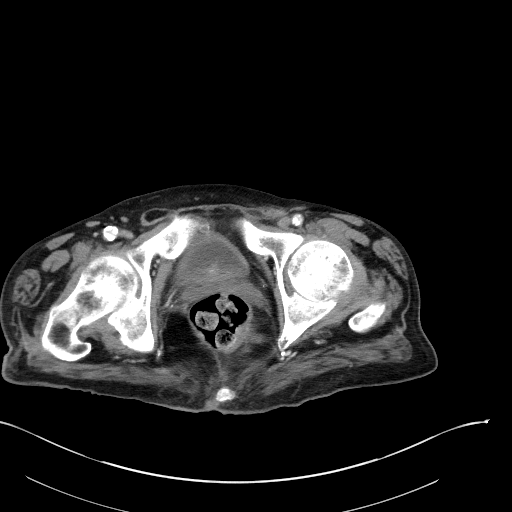
[im 15/129  lung]
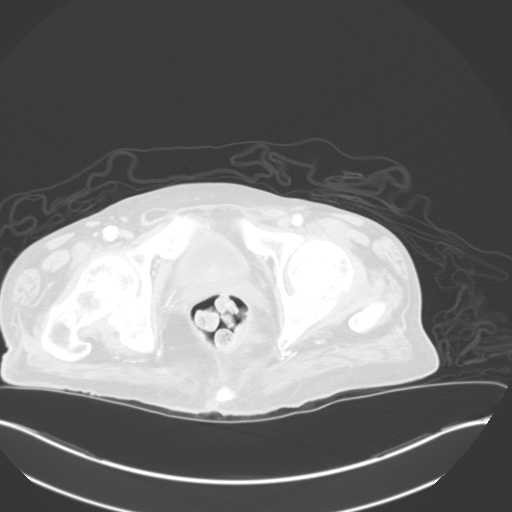
[im 29/129  lung]
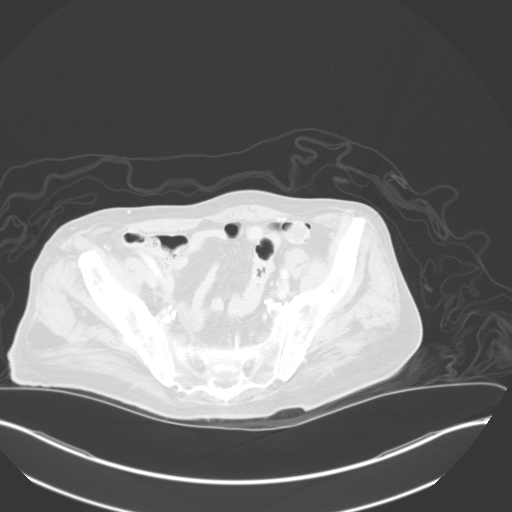
[im 43/129  lung]
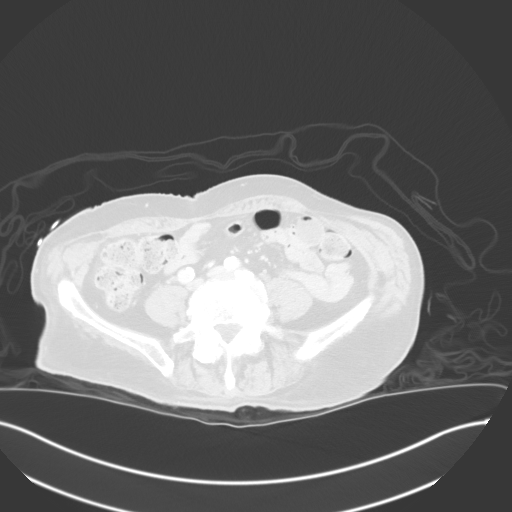
[im 57/129  lung]
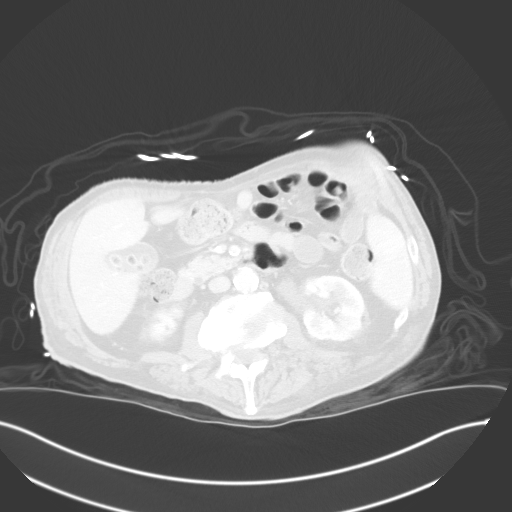
[im 72/129  mediastinal]
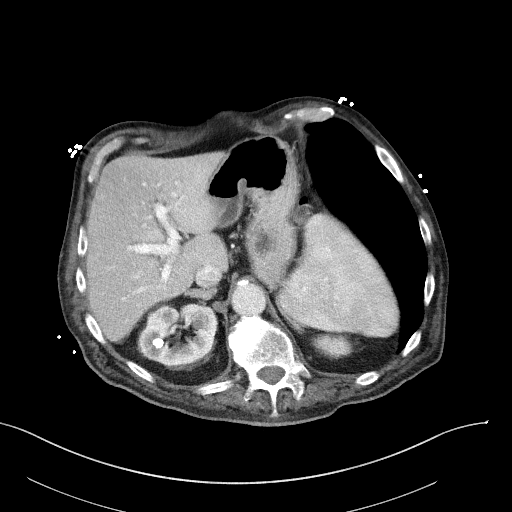
[im 72/129  lung]
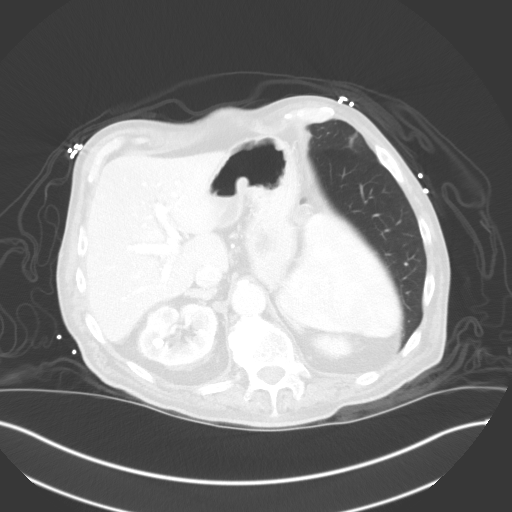
[im 86/129  lung]
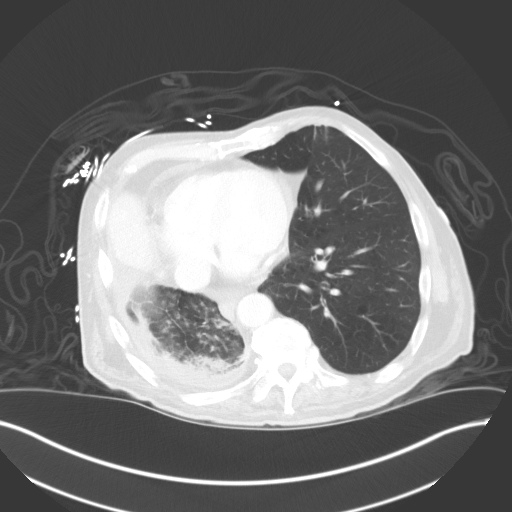
[im 100/129  lung]
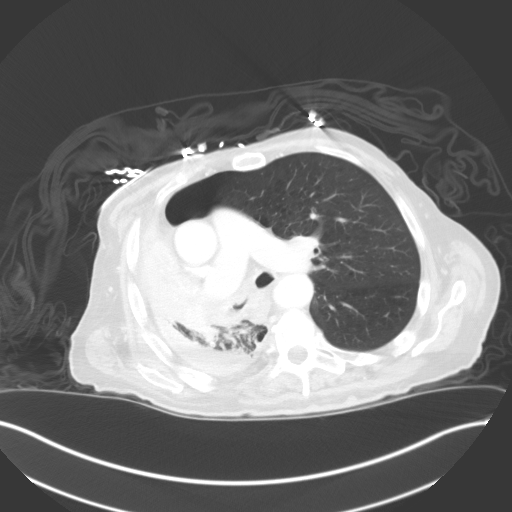
[im 114/129  lung]
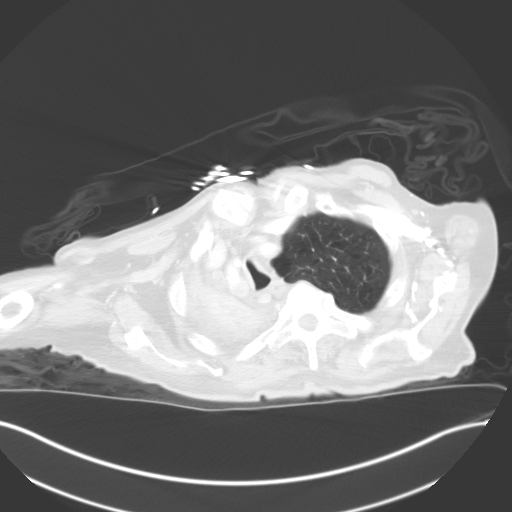

[Series 4: lung · axial · 0.82mm/px · z∈[-236,-180]mm · 2 of 180 slices shown]
[im 14/180  lung]
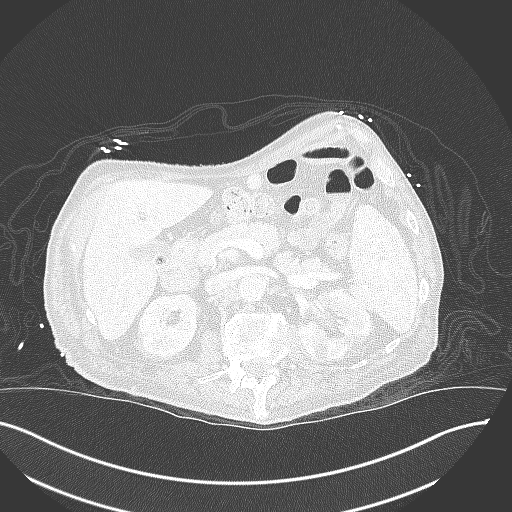
[im 42/180  lung]
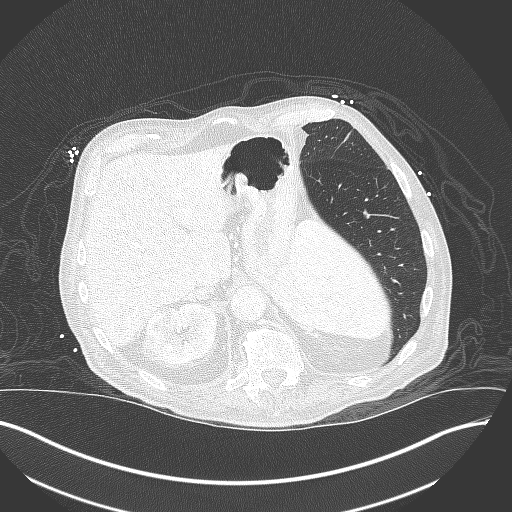

[Series 5: coronals · coronal · 0.78mm/px · 3 of 137 slices shown]
[im 28/137  lung]
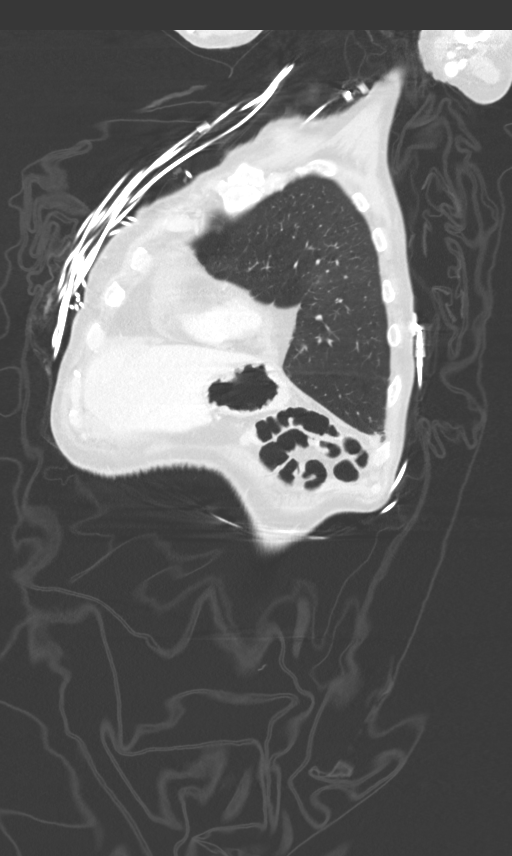
[im 55/137  lung]
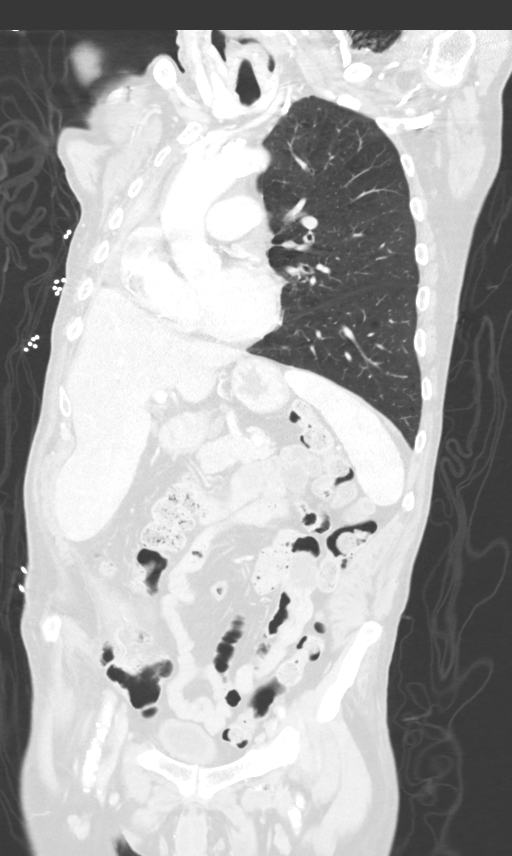
[im 82/137  lung]
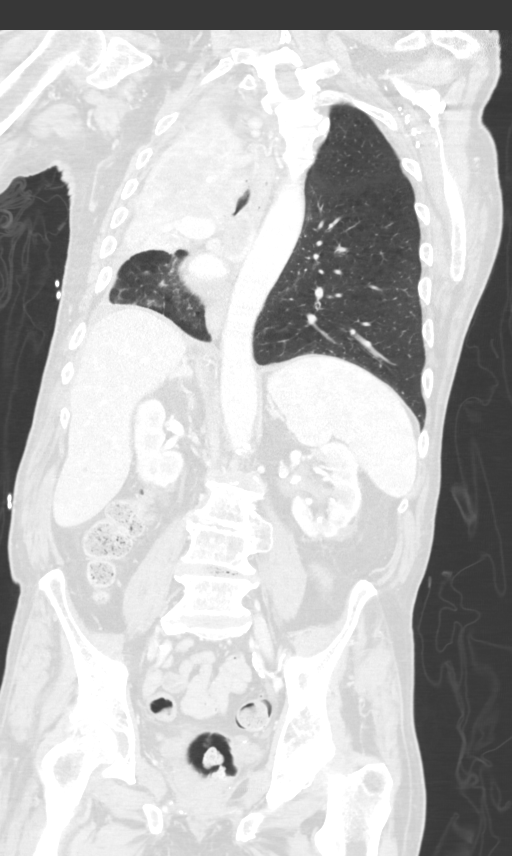

[13 of 36 positions shown; findings below may reference images not displayed]

FINDINGS: CT CHEST FINDINGS

Cardiovascular: Mild cardiac enlargement. Small pericardial
effusion. Aortic atherosclerosis. Lad and RCA coronary artery
atherosclerotic calcifications.

Mediastinum/Nodes: Normal appearance of the thyroid gland. The
trachea appears patent and is midline. Normal appearance of the
esophagus. Right paratracheal adenopathy is identified. Index lymph
node measures 2.6 cm, image [DATE]. This is compared with 1.3 cm
previously. Left paratracheal lymph node measures 1 point 3 cm,
image [DATE]. New from previous exam. Subcarinal lymph node measures
2.1 cm, image 36/2. Previously 1.6 cm.

Lungs/Pleura: Small loculated right pleural effusion is again
identified. There is significant volume loss from within the right
hemithorax with rightward shift of the mediastinum. Large mass
involving the entire right upper measures 11.9 by 11.9 by 6.0 cm.
The mass invades the mediastinum to the level of the carina. There
is encasement of the right mainstem bronchus with obstruction of the
right upper lobe and right middle lobe airway. Tumor extension into
the right lower lobe is identified. Central right lower lobe mass
measures 4.2 x 3.4 cm. Masslike pleural thickening along the
posterior and lateral right lower lobe is concerning for pleural
spread of disease.

No left pleural effusion. The left lung is hyperexpanded and crosses
the midline into the right hemithorax. No pulmonary nodule or mass
identified within the left lung.

Musculoskeletal: No chest wall mass or suspicious bone lesions
identified. No focal liver abnormality identified. Multiple stones
identified within the gallbladder. No gallbladder wall thickening or
biliary ductal dilatation.

CT ABDOMEN PELVIS FINDINGS

Hepatobiliary: No focal liver abnormality is seen. No gallstones,
gallbladder wall thickening, or biliary dilatation.

Pancreas: Normal appearance of the spleen.

Spleen: Mild nodular hypertrophy of the adrenal glands.

Adrenals/Urinary Tract: Bilateral kidney stones are identified. The
largest stone is in the central left renal pelvis measuring 0.9 cm.
No hydronephrosis identified bilaterally. Multiple kidney cysts are
noted small stone within the bladder near the right UVJ measures 2
mm.

Stomach/Bowel: Stomach is within normal limits. Appendix appears
normal. No evidence of bowel wall thickening, distention, or
inflammatory changes.

Vascular/Lymphatic: Aortic atherosclerosis. No aneurysm. No
abdominopelvic adenopathy identified.

Reproductive: Prostate is unremarkable.

Other: No abdominal wall hernia or abnormality. No abdominopelvic
ascites.

Musculoskeletal: Osteopenia and multilevel lumbar degenerative disc
disease. No acute or suspicious osseous findings.
IMPRESSION: 1. There is a large mass involving the entirety of the right upper
lobe with associated mediastinal and hilar invasion. The mass
encases the right mainstem bronchus and obstructs the right upper
lobe and right middle lobe airways. Significant volume loss within
the right lung is identified with rightward shift of the mediastinum
and secondary hyperexpansion of the left lung. Further investigation
with PET-CT and tissue sampling is advised.
2. There is tumor extension into the right lower lobe and suspected
pleural spread of disease with right pleural thickening and
loculated right pleural effusion.
3. Progressive bilateral mediastinal and subcarinal adenopathy.
4. No findings to suggest metastatic disease to the abdomen or
pelvis. No suspicious bone lesions noted.
5. Bilateral nephrolithiasis.
6. Gallstones
7. Aortic Atherosclerosis ([id number]-J58.8). Coronary artery
calcifications.
